# Patient Record
Sex: Male | Born: 1960 | Hispanic: No | Marital: Married | Smoking: Never smoker
Health system: Southern US, Community
[De-identification: ages and names within clinical notes are randomized; demographics above are authoritative.]

## PROBLEM LIST (undated history)

## (undated) ENCOUNTER — Emergency Department (HOSPITAL_COMMUNITY): Admission: EM | Payer: Self-pay | Source: Home / Self Care

## (undated) DIAGNOSIS — E785 Hyperlipidemia, unspecified: Secondary | ICD-10-CM

## (undated) DIAGNOSIS — M1990 Unspecified osteoarthritis, unspecified site: Secondary | ICD-10-CM

## (undated) DIAGNOSIS — I219 Acute myocardial infarction, unspecified: Secondary | ICD-10-CM

## (undated) DIAGNOSIS — I10 Essential (primary) hypertension: Secondary | ICD-10-CM

## (undated) DIAGNOSIS — G4733 Obstructive sleep apnea (adult) (pediatric): Secondary | ICD-10-CM

## (undated) DIAGNOSIS — I2510 Atherosclerotic heart disease of native coronary artery without angina pectoris: Secondary | ICD-10-CM

## (undated) DIAGNOSIS — J189 Pneumonia, unspecified organism: Secondary | ICD-10-CM

## (undated) HISTORY — PX: OTHER SURGICAL HISTORY: SHX169

## (undated) HISTORY — DX: Essential (primary) hypertension: I10

## (undated) HISTORY — DX: Atherosclerotic heart disease of native coronary artery without angina pectoris: I25.10

## (undated) HISTORY — DX: Unspecified osteoarthritis, unspecified site: M19.90

## (undated) HISTORY — DX: Hyperlipidemia, unspecified: E78.5

## (undated) HISTORY — PX: HERNIA REPAIR: SHX51

## (undated) HISTORY — DX: Obstructive sleep apnea (adult) (pediatric): G47.33

## (undated) HISTORY — PX: ARTHROSCOPIC REPAIR ACL: SUR80

---

## 2010-07-25 DIAGNOSIS — I219 Acute myocardial infarction, unspecified: Secondary | ICD-10-CM

## 2010-07-25 HISTORY — DX: Acute myocardial infarction, unspecified: I21.9

## 2010-07-25 HISTORY — PX: CORONARY STENT PLACEMENT: SHX1402

## 2010-12-20 ENCOUNTER — Inpatient Hospital Stay (HOSPITAL_COMMUNITY)
Admission: EM | Admit: 2010-12-20 | Discharge: 2010-12-23 | DRG: 247 | Disposition: A | Discharge status: Home / Self Care | Payer: 59 | Source: Ambulatory Visit | Attending: Cardiovascular Disease | Admitting: Cardiovascular Disease

## 2010-12-20 DIAGNOSIS — Z7982 Long term (current) use of aspirin: Secondary | ICD-10-CM

## 2010-12-20 DIAGNOSIS — I2510 Atherosclerotic heart disease of native coronary artery without angina pectoris: Secondary | ICD-10-CM | POA: Diagnosis present

## 2010-12-20 DIAGNOSIS — Z791 Long term (current) use of non-steroidal anti-inflammatories (NSAID): Secondary | ICD-10-CM

## 2010-12-20 DIAGNOSIS — Z79899 Other long term (current) drug therapy: Secondary | ICD-10-CM

## 2010-12-20 DIAGNOSIS — Z880 Allergy status to penicillin: Secondary | ICD-10-CM

## 2010-12-20 DIAGNOSIS — M1990 Unspecified osteoarthritis, unspecified site: Secondary | ICD-10-CM | POA: Diagnosis present

## 2010-12-20 DIAGNOSIS — E785 Hyperlipidemia, unspecified: Secondary | ICD-10-CM | POA: Diagnosis present

## 2010-12-20 DIAGNOSIS — I2582 Chronic total occlusion of coronary artery: Secondary | ICD-10-CM | POA: Diagnosis present

## 2010-12-20 DIAGNOSIS — E781 Pure hyperglyceridemia: Secondary | ICD-10-CM | POA: Diagnosis present

## 2010-12-20 DIAGNOSIS — Z7902 Long term (current) use of antithrombotics/antiplatelets: Secondary | ICD-10-CM

## 2010-12-20 DIAGNOSIS — I10 Essential (primary) hypertension: Secondary | ICD-10-CM | POA: Diagnosis present

## 2010-12-20 DIAGNOSIS — J45909 Unspecified asthma, uncomplicated: Secondary | ICD-10-CM | POA: Diagnosis present

## 2010-12-20 DIAGNOSIS — R079 Chest pain, unspecified: Secondary | ICD-10-CM

## 2010-12-20 DIAGNOSIS — I2119 ST elevation (STEMI) myocardial infarction involving other coronary artery of inferior wall: Principal | ICD-10-CM | POA: Diagnosis present

## 2010-12-20 DIAGNOSIS — F17200 Nicotine dependence, unspecified, uncomplicated: Secondary | ICD-10-CM | POA: Diagnosis present

## 2010-12-20 LAB — LIPID PANEL
Cholesterol: 203 mg/dL — ABNORMAL HIGH (ref 0–200)
Total CHOL/HDL Ratio: 8.1 RATIO
Triglycerides: 458 mg/dL — ABNORMAL HIGH (ref <150)
VLDL: UNDETERMINED mg/dL (ref 0–40)

## 2010-12-20 LAB — POCT I-STAT, CHEM 8
BUN: 17 mg/dL (ref 6–23)
Calcium, Ion: 1.16 mmol/L (ref 1.12–1.32)
Chloride: 110 mEq/L (ref 96–112)
Creatinine, Ser: 0.9 mg/dL (ref 0.4–1.5)
Glucose, Bld: 158 mg/dL — ABNORMAL HIGH (ref 70–99)
TCO2: 21 mmol/L (ref 0–100)

## 2010-12-20 LAB — COMPREHENSIVE METABOLIC PANEL
AST: 19 U/L (ref 0–37)
Albumin: 3.3 g/dL — ABNORMAL LOW (ref 3.5–5.2)
Alkaline Phosphatase: 96 U/L (ref 39–117)
BUN: 18 mg/dL (ref 6–23)
Chloride: 107 mEq/L (ref 96–112)
Creatinine, Ser: 0.87 mg/dL (ref 0.4–1.5)
GFR calc Af Amer: >60 mL/min (ref >60)
Potassium: 3.3 mEq/L — ABNORMAL LOW (ref 3.5–5.1)
Total Bilirubin: 0.2 mg/dL — ABNORMAL LOW (ref 0.3–1.2)
Total Protein: 6 g/dL (ref 6.0–8.3)

## 2010-12-20 LAB — CBC
HCT: 38.8 % — ABNORMAL LOW (ref 39.0–52.0)
MCH: 30.7 pg (ref 26.0–34.0)
MCHC: 34.5 g/dL (ref 30.0–36.0)
MCV: 89 fL (ref 78.0–100.0)
Platelets: 231 10*3/uL (ref 150–400)
RDW: 13.6 % (ref 11.5–15.5)

## 2010-12-20 LAB — CARDIAC PANEL(CRET KIN+CKTOT+MB+TROPI)
CK, MB: 2.7 ng/mL (ref 0.3–4.0)
Relative Index: 2.5 (ref 0.0–2.5)

## 2010-12-20 LAB — MRSA PCR SCREENING: MRSA by PCR: NEGATIVE

## 2010-12-21 DIAGNOSIS — I214 Non-ST elevation (NSTEMI) myocardial infarction: Secondary | ICD-10-CM

## 2010-12-21 LAB — CBC
HCT: 41.4 % (ref 39.0–52.0)
Hemoglobin: 13.2 g/dL (ref 13.0–17.0)
MCV: 91.6 fL (ref 78.0–100.0)
WBC: 10 10*3/uL (ref 4.0–10.5)

## 2010-12-21 LAB — CARDIAC PANEL(CRET KIN+CKTOT+MB+TROPI)
CK, MB: 109.9 ng/mL (ref 0.3–4.0)
CK, MB: 136.7 ng/mL (ref 0.3–4.0)
CK, MB: 80.4 ng/mL (ref 0.3–4.0)
Relative Index: 4.8 — ABNORMAL HIGH (ref 0.0–2.5)
Relative Index: 5.1 — ABNORMAL HIGH (ref 0.0–2.5)
Total CK: 2859 U/L — ABNORMAL HIGH (ref 7–232)

## 2010-12-21 LAB — BASIC METABOLIC PANEL
Calcium: 8.7 mg/dL (ref 8.4–10.5)
GFR calc Af Amer: >60 mL/min (ref >60)
GFR calc non Af Amer: >60 mL/min (ref >60)
Potassium: 4.4 mEq/L (ref 3.5–5.1)
Sodium: 139 mEq/L (ref 135–145)

## 2010-12-21 LAB — LIPID PANEL
Cholesterol: 206 mg/dL — ABNORMAL HIGH (ref 0–200)
HDL: 21 mg/dL — ABNORMAL LOW (ref >39)
LDL Cholesterol: UNDETERMINED mg/dL (ref 0–99)
Triglycerides: 557 mg/dL — ABNORMAL HIGH (ref <150)

## 2010-12-21 LAB — HEMOGLOBIN A1C
Hgb A1c MFr Bld: 5.4 % (ref <5.7)
Mean Plasma Glucose: 114 mg/dL (ref <117)
Mean Plasma Glucose: 108 mg/dL (ref <117)

## 2010-12-21 LAB — POCT ACTIVATED CLOTTING TIME: Activated Clotting Time: 334 seconds

## 2010-12-22 DIAGNOSIS — I10 Essential (primary) hypertension: Secondary | ICD-10-CM

## 2010-12-22 LAB — CBC
MCV: 92.3 fL (ref 78.0–100.0)
Platelets: 209 10*3/uL (ref 150–400)
RBC: 4.4 MIL/uL (ref 4.22–5.81)
RDW: 14.2 % (ref 11.5–15.5)
WBC: 11.9 10*3/uL — ABNORMAL HIGH (ref 4.0–10.5)

## 2010-12-22 LAB — LIPID PANEL
Cholesterol: 180 mg/dL (ref 0–200)
HDL: 24 mg/dL — ABNORMAL LOW (ref >39)
LDL Cholesterol: 78 mg/dL (ref 0–99)

## 2010-12-22 LAB — POCT ACTIVATED CLOTTING TIME: Activated Clotting Time: 364 seconds

## 2010-12-22 LAB — BASIC METABOLIC PANEL
BUN: 10 mg/dL (ref 6–23)
GFR calc Af Amer: >60 mL/min (ref >60)
GFR calc non Af Amer: >60 mL/min (ref >60)
Potassium: 4 mEq/L (ref 3.5–5.1)

## 2010-12-23 DIAGNOSIS — I2119 ST elevation (STEMI) myocardial infarction involving other coronary artery of inferior wall: Secondary | ICD-10-CM

## 2010-12-23 LAB — PROTIME-INR: Prothrombin Time: 13.5 seconds (ref 11.6–15.2)

## 2010-12-23 LAB — BASIC METABOLIC PANEL
Calcium: 9.2 mg/dL (ref 8.4–10.5)
GFR calc Af Amer: >60 mL/min (ref >60)
GFR calc non Af Amer: >60 mL/min (ref >60)
Potassium: 3.8 mEq/L (ref 3.5–5.1)
Sodium: 139 mEq/L (ref 135–145)

## 2010-12-23 LAB — CBC
HCT: 42 % (ref 39.0–52.0)
Hemoglobin: 14 g/dL (ref 13.0–17.0)
MCHC: 33.3 g/dL (ref 30.0–36.0)
MCV: 91.1 fL (ref 78.0–100.0)

## 2010-12-25 ENCOUNTER — Telehealth: Payer: Self-pay | Admitting: Physician Assistant

## 2010-12-25 NOTE — Telephone Encounter (Signed)
Pt's wife called requesting med for insomnia. I referred her to her husband's primary care MD. Pt otherwise stable, w/o cardiac complaints.

## 2011-01-05 ENCOUNTER — Encounter: Payer: Self-pay | Admitting: *Deleted

## 2011-01-06 ENCOUNTER — Encounter: Payer: Self-pay | Admitting: Physician Assistant

## 2011-01-06 ENCOUNTER — Ambulatory Visit (INDEPENDENT_AMBULATORY_CARE_PROVIDER_SITE_OTHER): Payer: 59 | Admitting: Physician Assistant

## 2011-01-06 VITALS — BP 110/80 | HR 72 | Ht 72.0 in | Wt 225.0 lb

## 2011-01-06 DIAGNOSIS — I10 Essential (primary) hypertension: Secondary | ICD-10-CM

## 2011-01-06 DIAGNOSIS — M1990 Unspecified osteoarthritis, unspecified site: Secondary | ICD-10-CM | POA: Insufficient documentation

## 2011-01-06 DIAGNOSIS — E785 Hyperlipidemia, unspecified: Secondary | ICD-10-CM | POA: Insufficient documentation

## 2011-01-06 DIAGNOSIS — I252 Old myocardial infarction: Secondary | ICD-10-CM | POA: Insufficient documentation

## 2011-01-06 DIAGNOSIS — I2510 Atherosclerotic heart disease of native coronary artery without angina pectoris: Secondary | ICD-10-CM

## 2011-01-06 NOTE — Assessment & Plan Note (Signed)
Controlled.  Continue current therapy.  

## 2011-01-06 NOTE — Progress Notes (Signed)
History of Present Illness: Primary Cardiologist: Dr. Tonny Bollman  Mario Underwood is a 50 y.o. male who was admitted 5/28-5/31 with an acute inferior STEMI.  Emergent cath demonstrated an occluded RCA which was treated with a Xience DES.  He had an 80% LAD as well.  This was treated with stage PCI with a Promus DES.  EF was 55% with inferior AK.  He returns for follow up.  Denies chest pain, dyspnea, syncope, orthopnea, PND.  No edema.  Right radial site ok.  Has lots of questions regarding activity, diet and when to return to work.  I spent several minutes today answering all of his questions.   Past Medical History  Diagnosis Date  . CAD (coronary artery disease)     A.  Inferior STEMI 12/12/10, treated with a Xience DES;  B.  Staged PCI of the LAD with a Promus DES;  C.  EF 55%, inferior AK  . Hypertension   . Hyperlipemia   . Asthma   . Osteoarthritis     Right knee TKR pending with Dr. Charlann Boxer    Current Outpatient Prescriptions  Medication Sig Dispense Refill  . aspirin 81 MG tablet Take 81 mg by mouth daily.        Marland Kitchen atorvastatin (LIPITOR) 80 MG tablet Take 80 mg by mouth daily.        . Glucosamine-Chondroitin-MSM 500-200-150 MG TABS Take by mouth.        Marland Kitchen lisinopril (PRINIVIL,ZESTRIL) 5 MG tablet Take 5 mg by mouth daily.        . metoprolol tartrate (LOPRESSOR) 25 MG tablet Take 25 mg by mouth 2 (two) times daily.        . Multiple Vitamin (DAILY MULTIVITAMIN PO) Take by mouth daily.        . nitroGLYCERIN (NITROSTAT) 0.4 MG SL tablet Place 0.4 mg under the tongue every 5 (five) minutes as needed.        . prasugrel (EFFIENT) 10 MG TABS Take by mouth daily.          Allergies: Allergies  Allergen Reactions  . Penicillins     Vital Signs: BP 110/80  Pulse 72  Ht 6' (1.829 m)  Wt 225 lb (102.059 kg)  BMI 30.52 kg/m2  PHYSICAL EXAM: Well nourished, well developed, in no acute distress HEENT: normal Neck: no JVD Cardiac:  normal S1, S2; RRR; no murmur Lungs:   clear to auscultation bilaterally, no wheezing, rhonchi or rales Abd: soft, nontender, no hepatomegaly Ext: no edema;  Right radial site without hematoma or bruit MSK:  Right knee brace noted Skin: warm and dry Neuro:  CNs 2-12 intact, no focal abnormalities noted  EKG:  NSR, HR 72, inferior Q waves with assoc TW inversions  ASSESSMENT AND PLAN:

## 2011-01-06 NOTE — Assessment & Plan Note (Signed)
Doing well.  No angina.  Continue ASA and Effient and statin.  Will start cardiac rehab soon.  Wants to go back to work.  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  Will bring him back in 4 weeks with Dr. Excell Seltzer or me to review before releasing for work.

## 2011-01-06 NOTE — Assessment & Plan Note (Signed)
Will send order to cardiac rehab in [place] so that precautions are taken for his knee (ie water, hand bike ,etc). Surgery postponed for now with recent stemi and DES.  He needs ASA and Effient for minimum of one year.

## 2011-01-06 NOTE — Assessment & Plan Note (Signed)
Check L/L in 8 weeks.

## 2011-01-06 NOTE — Patient Instructions (Signed)
Your physician recommends that you schedule a follow-up appointment in: PT NEEDS APPT THE WEEK BEFORE 01/26/11 TO SEE EITHER DR. Excell Seltzer OR SCOTT WEAVER, PA-C SAME DAY DR. Excell Seltzer IS IN THE OFFICE.  Your physician recommends that you return for lab work in: 8 WEEKS FOR FASTING LIVER/LIPID PANEL 272.4.

## 2011-01-07 ENCOUNTER — Encounter: Payer: Self-pay | Admitting: Physician Assistant

## 2011-01-14 ENCOUNTER — Ambulatory Visit: Payer: 59 | Admitting: Physician Assistant

## 2011-01-15 ENCOUNTER — Other Ambulatory Visit: Payer: Self-pay | Admitting: Cardiovascular Disease

## 2011-01-19 ENCOUNTER — Telehealth: Payer: Self-pay | Admitting: Cardiovascular Disease

## 2011-01-19 MED ORDER — ATORVASTATIN CALCIUM 80 MG PO TABS: 80.0000 mg | ORAL_TABLET | Freq: Every day | ORAL | Status: DC

## 2011-01-19 NOTE — Telephone Encounter (Signed)
Pt needs refill asap he is going on vacation and he needs lipitor 80mg  qd called into [facility] in [place] on [address].

## 2011-01-20 NOTE — H&P (Signed)
NAME:  Mario Underwood, MINER NO.:  [id number]  MEDICAL RECORD NO.:  [id number]           PATIENT TYPE:  I  LOCATION:  2807                         FACILITY:  [facility]  PHYSICIAN:  Therisa Doyne, MD    DATE OF BIRTH:  26-Dec-1960  DATE OF ADMISSION:  12/20/2010 DATE OF DISCHARGE:                             HISTORY & PHYSICAL   PRIMARY CARDIOLOGIST:  None.  CHIEF COMPLAINT:  Chest pain, the patient presents as a code STEMI.  HISTORY OF PRESENT ILLNESS:  This is a 50 year old white male with past medical history significant for hypertension who presents with 45 minutes of substernal chest pain and was found to have evidence of inferior ST-segment elevation.  The patient denies any recent angina or heart failure symptoms.  This evening he was in his usual state of health when he developed crushing substernal chest pressure that radiated to his left jaw.  EMS was called and initial EKG demonstrated inferior ST-segment elevation.  A code STEMI was called.  He received aspirin 325 mg and 3 sublingual nitroglycerin and was transferred directly to [facility] for primary PCI.  In the cath lab, he had evidence of an 100% occluded distal right coronary artery.  This was opened up with a 3 x 38 Xience Prime stent.  There was also obstructive disease in the proximal LAD.  LVEDP 27.  His left ventricular function was mildly reduced with inferior hypokinesis.  He did receive 3000 units of heparin, 16 mg of Effient was placed on Angiomax as well as Integrilin because of sluggish flow in the cath lab.  PAST MEDICAL HISTORY: 1. Hypertension. 2. Asthma.  SOCIAL HISTORY:  The patient denies any tobacco use.  She drinks a 6- pack of alcohol on the weekends.  Denies any drug use.  FAMILY HISTORY:  Notable for coronary disease in his father at a late age.  There is no history of premature coronary disease.  MEDICATIONS: 1. Bystolic 20 mg daily. 2. Proventil  ALLERGIES:   PENICILLIN.  REVIEW OF SYSTEMS:  All systems were reviewed and are negative except as mentioned above in history of present illness.  PHYSICAL EXAMINATION:  VITAL SIGNS:  Blood pressure 150/70, heart rate 90, respirations 20, oxygen saturation 100% on room air. GENERAL:  In moderate distress, diaphoretic. HEENT:  Oropharynx pink and moist without any lesions.  Pupils equal, round, and reactive to light and accommodation. NECK:  Supple.  No jugular venous distention.  No lymphadenopathy, no bruits. CARDIOVASCULAR:  Regular rate and rhythm with no murmurs, rubs, or gallops. CHEST:  Clear to auscultation bilaterally. ABDOMEN:  Positive bowel sounds, nontender, and nondistended. EXTREMITIES:  No clubbing, cyanosis, or edema.  Dorsalis pedis pulse 2+ bilaterally. SKIN:  No rashes. NEUROLOGIC:  No focal deficits. PSYCH:  Normal affect, appropriately anxious. PERIPHERAL PULSES:  2+ femoral pulses without bruits, 2+ radial pulses bilaterally with an intact palmar arch on the right.  LABORATORY DATA:  Hemoglobin 13.3, potassium 3.3, and creatinine is 0.9.  CATHETERIZATION LABORATORY DATA:  Please see dictated cath report. Briefly 100% distal right coronary arteries with 3 x 38 Xience Prime stent.  There was obstructive disease in proximal LAD.  LVEDP 27. Mildly reduced left ventricular function with inferior hypokinesis while the patient is tentatively planned to undergo knee surgery in July with discussion with him about the extent of his coronary disease and he will be best managed with drug-eluting stents and he was agreeable to this approach and understood that this would delay his knee surgeries.  ASSESSMENT AND PLAN:  This is a 50 year old white male with a past medical history significant for hypertension who presents with inferior stenting status post drug-eluting stent placement to the distal right coronary artery. 1. We will admit the patient to [facility] to the CCU  under     Dr. Colvin Caroli care. 2. Inferior ST-elevation myocardial infarction.  He has been     successfully revascularized with a Xience Prime stent.  Because of     his TIMI-2 flow at the end of the case, we will continue on     Integrilin for 18 hours and after the procedure we will continue     him on aspirin 325 mg daily and Effient 10 mg daily.  We will start     him on beta-blocker, ACE inhibitor, and statin therapy.  He will     need to have his left anterior descending artery disease     revascularized with a staged procedure.  We will check a fasting     profile with hemoglobin A1c and cycle cardiac enzymes. 3. Hypertension.  We will monitor the patient's blood pressure on the     above medications. 4. Fluids, electrolytes, nutrition.  Normal saline at 75 mL per hour.     We will replete his potassium as he does have evidence of     hypokalemia.  Regular diet. 5. Deep venous thrombosis prophylaxis.  Subcutaneous heparin.     Therisa Doyne, MD     SJT/MEDQ  D:  12/20/2010  T:  12/20/2010  Job:  [id number]  Electronically Signed by Aldona Bar MD on 01/20/2011 10:10:11 PM

## 2011-02-01 ENCOUNTER — Encounter: Payer: Self-pay | Admitting: Physician Assistant

## 2011-02-01 ENCOUNTER — Ambulatory Visit (INDEPENDENT_AMBULATORY_CARE_PROVIDER_SITE_OTHER): Payer: 59 | Admitting: Physician Assistant

## 2011-02-01 ENCOUNTER — Other Ambulatory Visit: Payer: Self-pay | Admitting: *Deleted

## 2011-02-01 VITALS — BP 92/60 | HR 60 | Resp 16 | Ht 71.0 in | Wt 220.0 lb

## 2011-02-01 DIAGNOSIS — I2510 Atherosclerotic heart disease of native coronary artery without angina pectoris: Secondary | ICD-10-CM

## 2011-02-01 MED ORDER — PRASUGREL HCL 10 MG PO TABS: 10.0000 mg | ORAL_TABLET | Freq: Every day | ORAL | Status: DC

## 2011-02-01 NOTE — Telephone Encounter (Signed)
rx sent in for effient with 11 refills. Danielle Rankin

## 2011-02-01 NOTE — Progress Notes (Signed)
History of Present Illness: Primary Cardiologist: Dr. Tonny Bollman  Mario Underwood is a 50 y.o. male who was admitted 5/28-5/31 with an acute inferior STEMI.  Emergent cath demonstrated an occluded RCA which was treated with a Xience DES.  He had an 80% LAD as well.  This was treated with staged PCI with a Promus DES.  EF was 55% with inferior AK.  I saw him a month ago in follow up.  He is brought back today to get released to go back to work.  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  Has been doing good with cardiac rehab.  No complaints today.  No chest pain, dyspnea, fatigue, syncope or near syncope.  Has lost 5 pounds.  Past Medical History  Diagnosis Date  . CAD (coronary artery disease)     A.  Inferior STEMI 12/12/10, treated with a Xience DES;  B.  Staged PCI of the LAD with a Promus DES;  C.  EF 55%, inferior AK  . Hypertension   . Hyperlipemia   . Asthma   . Osteoarthritis     Right knee TKR pending with Dr. Charlann Boxer    Current Outpatient Prescriptions  Medication Sig Dispense Refill  . aspirin 81 MG tablet Take 81 mg by mouth daily.        Marland Kitchen atorvastatin (LIPITOR) 80 MG tablet Take 1 tablet (80 mg total) by mouth daily.  30 tablet  11  . EFFIENT 10 MG TABS TAKE 1 TABLET DAILY.  30 tablet  0  . Glucosamine-Chondroitin-MSM 500-200-150 MG TABS Take by mouth.        Marland Kitchen lisinopril (PRINIVIL,ZESTRIL) 5 MG tablet Take 5 mg by mouth daily.        . metoprolol tartrate (LOPRESSOR) 25 MG tablet Take 25 mg by mouth 2 (two) times daily.        . Multiple Vitamin (DAILY MULTIVITAMIN PO) Take by mouth daily.        . nitroGLYCERIN (NITROSTAT) 0.4 MG SL tablet Place 0.4 mg under the tongue every 5 (five) minutes as needed.          Allergies: Allergies  Allergen Reactions  . Penicillins     Vital Signs: BP 92/60  Pulse 60  Resp 16  Ht 5\' 11"  (1.803 m)  Wt 220 lb (99.791 kg)  BMI 30.68 kg/m2  PHYSICAL EXAM: Well nourished, well  developed, in no acute distress HEENT: normal Neck: no JVD Cardiac:  normal S1, S2; RRR; no murmur Lungs:  clear to auscultation bilaterally, no wheezing, rhonchi or rales Abd: soft, nontender, no hepatomegaly Ext: no edema MSK:  Right knee brace noted Skin: warm and dry Neuro:  CNs 2-12 intact, no focal abnormalities noted   ASSESSMENT AND PLAN:

## 2011-02-01 NOTE — Assessment & Plan Note (Signed)
Doing well, now 6 weeks post MI.  Patient spoke to Dr. Excell Seltzer.  I spoke with Dr. Excell Seltzer as well.  We plan to release him to go back to work 1/2 days starting next week for the next 1-2 months.  Letter will be very specific to allow frequent breaks and to not work if outside [employer] exceed 95 degrees.  Patient has follow up with Dr. Excell Seltzer in a few weeks.  Will keep that so he can update [place] on how he is doing back at work.

## 2011-02-10 NOTE — Cardiovascular Report (Signed)
NAME:  Mario Underwood, Mario Underwood NO.:  [id number]  MEDICAL RECORD NO.:  [id number]           PATIENT TYPE:  I  LOCATION:  2908                         FACILITY:  [facility]  PHYSICIAN:  Veverly Fells. Excell Seltzer, MD  DATE OF BIRTH:  11-20-1960  DATE OF PROCEDURE:  12/20/2010 DATE OF DISCHARGE:                           CARDIAC CATHETERIZATION   PROCEDURE: 1. Left heart catheterization. 2. Selective coronary angiography. 3. Left ventricular angiography. 4. PTCA and stenting of the right coronary artery.  PROCEDURAL INDICATIONS:  Mario Underwood is a 50 year old gentleman presenting with an acute inferior wall myocardial infarction.  He was brought directly from [facility] to the cardiac cath lab after a code STEMI was called.  The patient has no past cardiac history.  He is treated for hypertension and asthma.  Risks and indications of the procedure were reviewed with the patient, emergency consent was obtained.  The right wrist was prepped, draped and anesthetized with 1% lidocaine.  Using modified Seldinger technique, a 6- French sheath was placed in the right radial artery via a front wall puncture.  There was no difficulty with access.  The patient HAD been given 3000 units of unfractionated heparin and a bivalirudin infusion was started.  A JL-3.5 cm guide catheter was used to image the left coronary artery.  A JR-4 guide catheter was used to image the right coronary artery.  Left coronary artery images demonstrated severe LAD stenosis, but this did not appear to be his culprit vessel.  The right coronary artery was totally occluded in the midportion and this was clearly an acute occlusion with the coronary distribution fitting the electrocardiographic changes.  The patient was given 60 mg of Effient. A Cougar guidewire was used to cross the lesion.  The vessel was dilated with a 2.5 x 15-mm balloon, which was taken to 6 atmospheres for a total of 2 inflations.  There was  a long segment of severe segmental disease in the mid and distal vessel.  The patient also had severe LAD stenosis that would require intervention.  I debated about whether to treat the patient with a drug-eluting or bare-metal stent platform because he needs to have total knee replacement.  I felt the best treatment from a cardiac perspective would be to use a drug-eluting stent platform considering the diffuse nature of his coronary disease and the need for multivessel intervention.  I discussed this with the patient and elected to treat him that way.  A 3.0 x 38-mm Xience drug-eluting stent was carefully positioned and deployed at 10 atmospheres.  The stent was postdilated with a 3.25 x 20-mm Phillips Quantum apex balloon, which was taken to 16 and 14 atmospheres.  There was a good angiographic result at the lesion site, however, there was TIMI II flow in all the distal branch vessels.  The patient was given repeated doses of intracoronary nitroglycerin.  Integrilin had been started via a double bolus and drip protocol because of sluggish flow in the distal vasculature.  The patient's chest pain had diminished greatly and I felt he had received a maximum benefit from his PCI procedure.  A  pigtail catheter was used to perform left ventriculography into a pullback across the aortic valve. He was hemodynamically stable throughout the entire procedure.  TR band was used for radial hemostasis.  PROCEDURAL FINDINGS:  Aortic pressure 137/82 with a mean of 109, left ventricular pressure 135/25.  Left ventriculography shows basal inferior akinesis.  The other LV segments are hyperdynamic.  The left ventricular ejection fraction is 55%.  CORONARY ANGIOGRAPHY:  The right coronary has segmental stenosis.  The vessel has a 50% proximal lesion.  There is diffuse plaquing in the midportion.  The distal RCA just beyond the RV marginal branch has a total occlusion after reopening the vessel.  The vessel  is seemed to give off PDA branch and at least 4 posterolateral branches.  The left main is patent.  It divides into the LAD and left circumflex.  LAD:  The LAD is a large-caliber vessel.  There is a high diagonal/ramus intermedius branch that has a 50-60% proximal stenosis.  The second diagonal branch has nonobstructive stenosis in the range of 40%.  The LAD just beyond the second diagonal branch has an 80% eccentric long segment stenosis and the vessel courses down and wraps around the left ventricular apex.  Left circumflex.  The left circumflex involves a fairly small distribution.  There are 2 OM branches both with minor diffuse plaque.  FINAL ASSESSMENT: 1. Inferior wall myocardial infarction secondary to total occlusion of     the right coronary artery, with successful primary percutaneous     coronary intervention using a long drug-eluting stent platform.  Of     note, there was TIMI II flow into the distal branch vessels of the     right coronary artery at the completion of the procedure. 2. Severe mid left anterior descending stenosis. 3. Moderate ramus intermedius/diagonal stenosis. 4. Mild left ventricular segmental dysfunction.  PLAN:  The patient should receive aspirin and Effient for a minimum of 12 months.  He will likely need staged PCI of the LAD.  We will discuss this with him further.  We need to their review his revascularization options in light of the need for knee replacement.     Veverly Fells. Excell Seltzer, MD     MDC/MEDQ  D:  12/20/2010  T:  12/21/2010  Job:  [id number]  cc:   Verl Dicker  Electronically Signed by Tonny Bollman MD on 02/10/2011 12:07:19 AM

## 2011-02-10 NOTE — Cardiovascular Report (Signed)
  NAME:  Mario Underwood, Mario Underwood NO.:  [id number]  MEDICAL RECORD NO.:  [id number]          PATIENT TYPE:  LOCATION:                                 FACILITY:  PHYSICIAN:  Veverly Fells. Excell Seltzer, MD  DATE OF BIRTH:  [birth year]  DATE OF PROCEDURE:  12/22/2010 DATE OF DISCHARGE:                           CARDIAC CATHETERIZATION   PROCEDURE: 1. Selective coronary angiography of the right coronary artery. 2. Drug-eluting stent implantation in the LAD.  PROCEDURAL INDICATIONS:  Mario Underwood is a 50 year old gentleman who presented with an acute inferior wall ST-elevation MI on Dec 20, 2010. He was treated with primary PCI using a long drug-eluting stent.  He returns today for staged PCI of the LAD.  He has a long segment 80% proximal to mid LAD stenosis.  Risks and indications of the procedure were reviewed with the patient. Informed consent was obtained.  The right wrist was prepped, draped, and anesthetized with 1% lidocaine.  Using the modified Seldinger technique, a 6-French sheath was placed in the right radial artery.  Verapamil 3 mg was administered through the sheath.  Bivalirudin was started for anticoagulation.  The patient has been adequately preloaded with aspirin and Effient.  Once a therapeutic ACT was achieved, a BMW guidewire was advanced into the LAD.  I had previously placed a Cougar guidewire into an intermediate branch as I was unable to engage the LAD without wire. The BMW was also difficult to enter into the LAD because of acute angulation, but with the assistance of a balloon, I was able to direct the wire beyond the area of stenosis into the distal LAD.  The vessel was then predilated with a 2.5- x 15-mm apex balloon which was dilated to 8 atmospheres.  The balloon was left in place to assess lesion length.  The wire was also pulled back to use a radiopaque portion for further assessment of lesion length.  I ultimately decided to treat the lesion with a  3.0- x 28-mm Promus drug-eluting stent.  The stent was carefully positioned and deployed at 12 atmospheres.  It appeared under- expanded, and the proximal portion of the stent was therefore postdilated with a 3.25- x 20-mm Chauncey Quantum apex balloon which was taken to 16 atmospheres for a total of two inflations which left 0% residual stenosis.  There was TIMI 3 flow in the vessel.  The patient tolerated the entire procedure well.  There were no immediate complications.  A TR band was used for radial hemostasis.  FINAL ASSESSMENT:  Successful percutaneous coronary intervention of severe proximal left anterior descending stenosis using a drug-eluting stent platform.  RECOMMENDATIONS:  The patient should be continued on dual antiplatelet therapy with aspirin and Effient for a minimum of 12 months without interruption.     Veverly Fells. Excell Seltzer, MD     MDC/MEDQ  D:  12/22/2010  T:  12/23/2010  Job:  [id number]  cc:   Levonne Lapping  Electronically Signed by Tonny Bollman MD on 02/10/2011 12:07:25 AM

## 2011-02-10 NOTE — Discharge Summary (Signed)
NAME:  Mario Underwood, KNOBLOCK NO.:  [id number]  MEDICAL RECORD NO.:  [id number]           PATIENT TYPE:  I  LOCATION:  6533                         FACILITY:  [facility]  PHYSICIAN:  Veverly Fells. Excell Seltzer, MD  DATE OF BIRTH:  12/29/1960  DATE OF ADMISSION:  12/20/2010 DATE OF DISCHARGE:  12/23/2010                              DISCHARGE SUMMARY   PRIMARY CARDIOLOGIST:  Veverly Fells. Excell Seltzer, MD  PRIMARY CARE PROVIDER:  Verdia Kuba, MD in [place].  DISCHARGE DIAGNOSIS:  Acute inferior ST-segment elevation myocardial infarction.  SECONDARY DIAGNOSES: 1. Coronary artery disease, status post drug-eluting stent placement     of the right coronary artery and proximal left anterior descending     artery this admission. 2. Hypertension. 3. Hyperlipidemia. 4. Hypertriglyceridemia. 5. Asthma. 6. Osteoarthritis with heavy usage of nonsteroidal anti-inflammatory     drugs in the past. 7. Tobacco abuse (dip).  ALLERGIES:  PENICILLIN.  PROCEDURES: 1. Emergent left heart cardiac catheterization performed on Dec 20, 2010, revealing a total occlusion of the mid-right coronary artery     as well as an 80% stenosis in the LAD and nonobstructive disease in     the ramus intermedius and diagonal.  The RCA was successfully     stented with 3.0 x 38-mm Xience Prime LL drug-eluting stent. 2. Staged PCI and stenting of the proximal LAD on Dec 22, 2010, and     placement of a 3.0 x 28-mm Promus Element Plus drug-eluting stent.  HISTORY OF PRESENT ILLNESS:  A 50 year old male without prior history of coronary artery disease who was in his usual state of health until the early morning hours on Dec 20, 2010, when the patient noted substernal chest discomfort with radiation to his jaw.  EMS was activated and he was noted to have inferior ST-segment elevation on ECG.  He was treated with aspirin, morphine, nitroglycerin, and heparin and taken emergently to the [facility]  Lab.  HOSPITAL COURSE:  The patient underwent emergent diagnostic catheterization revealing a total occlusion of the mid-right coronary artery as well as an 80% stenosis in the LAD.  The right coronary artery was felt to be the infarct vessel and this was successfully treated at 3.0 x 38-mm Xience Prime drug-eluting stent.  It was felt that the LAD would benefit from intervention in a staged approach.  The patient was treated with aspirin and Effient and monitored in the coronary intensive care unit where high-dose statin, beta-blocker and ACE inhibitor were started.  The patient eventually peaked his CK at 2859, MB at 136.7, and troponin-I greater than 25.  He had no recurrent chest pain and was taken back to Cath Lab on Dec 22, 2010, where he underwent PCI and stenting of the proximal LAD with placement of 3.0 x 28-mm Promus Element Plus drug-eluting stent.  The patient tolerated this procedure well and postprocedure, has been ambulating without recurrent symptoms or limitations.  He has been seen by Cardiac Rehab with outpatient referral made.  He has also been counseled on the importance of cessation of oral tobacco, which he now says  he quit.  Finally, he has been counseled the importance of avoidance of nonsteroidals in the setting of aspirin and Effient as well as recent MI.  The patient had previously been scheduled for total knee arthroplasty in July, which at this point we advised again that he will require aspirin and Effient therapy without cessation for the next 12 months.  He will follow up with Dr. Charlann Boxer for additional pain management of that knee pain.  The patient will be discharged home today in good condition.  DISCHARGE LABS:  Hemoglobin 14.0, hematocrit 42.2 WBC 8.8, platelets 206.  INR 1.01.  Sodium 139, potassium 3.8, chloride 103, CO2 28, BUN 10, creatinine 0.78, glucose 87.  Total bilirubin 0.2, alkaline phosphatase 96, AST 19, ALT 19, total protein 6.0, albumin  3.2, calcium 9.2.  Hemoglobin A1c 5.4.  CK 1571, MB 80.4, troponin-I greater than 25. Total cholesterol 180, triglycerides 389, HDL 24, LDL 78.  MRSA screen was negative.  DISPOSITION:  The patient will be discharged home today in good condition.  FOLLOWUP PLANS AND APPOINTMENTS:  The patient will follow up with Tereso Newcomer, PA on January 06, 2011, at 10:30 a.m.  He was asked to follow up with Dr. Charlann Boxer and Dr. Marina Goodell as previously scheduled.  DISCHARGE MEDICATIONS: 1. Acetaminophen 5 mg two tablets q.6 h. p.r.n. arthritic pain. 2. Aspirin 81 mg daily. 3. Lipitor 80 mg nightly. 4. Lisinopril 5 mg daily. 5. Metoprolol 25 mg b.i.d. 6. Nitroglycerin 0.4 mg sublingual p.r.n. chest pain. 7. Prasugrel 10 mg daily. 8. Glucosamine and chondroitin one tablet daily. 9. Multivitamin one tablet daily.  OUTSTANDING LABORATORY STUDIES:  Follow up lipids and LFTs in 6-8 weeks given new statin therapy.  DURATION OF DISCHARGE ENCOUNTER:  Sixty minutes including physician time.     [name], ANP   ______________________________ Veverly Fells. Excell Seltzer, MD   CB/MEDQ  D:  12/23/2010  T:  12/24/2010  Job:  [id number]  cc:   Verdia Kuba, MD Madlyn Frankel. Charlann Boxer, M.D.  Electronically Signed by [name] ANP on 12/28/2010 08:07:52 PM Electronically Signed by Tonny Bollman MD on 02/10/2011 12:07:30 AM

## 2011-02-18 ENCOUNTER — Encounter: Payer: Self-pay | Admitting: Cardiovascular Disease

## 2011-02-18 ENCOUNTER — Ambulatory Visit (INDEPENDENT_AMBULATORY_CARE_PROVIDER_SITE_OTHER): Payer: 59 | Admitting: Cardiovascular Disease

## 2011-02-18 DIAGNOSIS — E785 Hyperlipidemia, unspecified: Secondary | ICD-10-CM

## 2011-02-18 DIAGNOSIS — I10 Essential (primary) hypertension: Secondary | ICD-10-CM

## 2011-02-18 DIAGNOSIS — I2510 Atherosclerotic heart disease of native coronary artery without angina pectoris: Secondary | ICD-10-CM

## 2011-02-18 NOTE — Progress Notes (Signed)
HPI:  This is a 50 year old gentleman presenting for followup evaluation.  The patient presented May 28 with an acute inferior wall MI. He was treated with primary PCI is in a long drug eluding stent in the right coronary artery. He underwent staged intervention of the LAD a few days later. He presents today for follow up evaluation. The patient is doing very well from a cardiovascular standpoint. He has lost over 30 pounds. He denies chest pain or dyspnea. He is back to work part-time and tolerating this well. He continues to be limited by right knee pain and is awaiting clearance to undergo right knee replacement. He is going to have to wait until next year because of his MI and treatment with drug-eluting stents.  Outpatient Encounter Prescriptions as of 02/18/2011  Medication Sig Dispense Refill  . aspirin 81 MG tablet Take 81 mg by mouth daily.        Marland Kitchen atorvastatin (LIPITOR) 80 MG tablet Take 1 tablet (80 mg total) by mouth daily.  30 tablet  11  . Glucosamine-Chondroitin-MSM 500-200-150 MG TABS Take by mouth.        Marland Kitchen lisinopril (PRINIVIL,ZESTRIL) 5 MG tablet Take 5 mg by mouth daily.        . metoprolol tartrate (LOPRESSOR) 25 MG tablet Take 25 mg by mouth 2 (two) times daily.        . Multiple Vitamin (DAILY MULTIVITAMIN PO) Take by mouth daily.        . nitroGLYCERIN (NITROSTAT) 0.4 MG SL tablet Place 0.4 mg under the tongue every 5 (five) minutes as needed.        . prasugrel (EFFIENT) 10 MG TABS Take 1 tablet (10 mg total) by mouth daily.  30 tablet  11    Allergies  Allergen Reactions  . Penicillins     Past Medical History  Diagnosis Date  . CAD (coronary artery disease)     A.  Inferior STEMI 12/12/10, treated with a Xience DES;  B.  Staged PCI of the LAD with a Promus DES;  C.  EF 55%, inferior AK  . Hypertension   . Hyperlipemia   . Asthma   . Osteoarthritis     Right knee TKR pending with Dr. Charlann Boxer    ROS: Negative except as per HPI  BP 109/73  Pulse 82  Resp 16  Ht  5\' 11"  (1.803 m)  Wt 216 lb (97.977 kg)  BMI 30.13 kg/m2  PHYSICAL EXAM: Pt is alert and oriented, NAD HEENT: normal Neck: JVP - normal, carotids 2+= without bruits Lungs: CTA bilaterally CV: RRR without murmur or gallop Abd: soft, NT, Positive BS, no hepatomegaly Ext: no C/C/E, distal pulses intact and equal Skin: warm/dry no rash  ASSESSMENT AND PLAN:

## 2011-02-18 NOTE — Assessment & Plan Note (Signed)
Blood pressure very well controlled on current medical regimen.

## 2011-02-18 NOTE — Patient Instructions (Signed)
Your physician recommends that you schedule a follow-up appointment in: 3 months with Dr. Cooper.  

## 2011-02-18 NOTE — Assessment & Plan Note (Signed)
The patient is stable without angina. He is on a good medical program with aspirin, effient, atorvastatin, metoprolol, and lisinopril. I like to see him back in 3 months for followup evaluation. He is going to move from part-time to full-time work in September. I think he will probably tolerate this well. Note the patient has preserved LV function. When he gets closer to his knee replacement surgery next year we'll plan on a Myoview stress scan to rule out ischemia before surgery.

## 2011-02-18 NOTE — Assessment & Plan Note (Signed)
Patient is scheduled for followup labs in August to assess his lipids and LFTs.

## 2011-03-03 ENCOUNTER — Other Ambulatory Visit (INDEPENDENT_AMBULATORY_CARE_PROVIDER_SITE_OTHER): Payer: 59 | Admitting: *Deleted

## 2011-03-03 DIAGNOSIS — E785 Hyperlipidemia, unspecified: Secondary | ICD-10-CM

## 2011-03-03 LAB — HEPATIC FUNCTION PANEL
ALT: 30 U/L (ref 0–53)
AST: 23 U/L (ref 0–37)
Albumin: 4.3 g/dL (ref 3.5–5.2)
Alkaline Phosphatase: 94 U/L (ref 39–117)
Total Bilirubin: 1.1 mg/dL (ref 0.3–1.2)

## 2011-03-03 LAB — LIPID PANEL
Cholesterol: 92 mg/dL (ref 0–200)
LDL Cholesterol: 45 mg/dL (ref 0–99)
VLDL: 11.6 mg/dL (ref 0.0–40.0)

## 2011-03-04 ENCOUNTER — Telehealth: Payer: Self-pay | Admitting: *Deleted

## 2011-03-04 NOTE — Telephone Encounter (Signed)
pt aware of lab results and to start 1000 mg of fish oil. Mario Underwood

## 2011-03-07 ENCOUNTER — Ambulatory Visit: Payer: 59 | Admitting: Cardiovascular Disease

## 2011-03-22 ENCOUNTER — Telehealth: Payer: Self-pay | Admitting: Cardiovascular Disease

## 2011-03-22 NOTE — Telephone Encounter (Signed)
Pt calling to see if it was ok for him to get a cortisone shot in pt knee. Pt is going in for appt tomorrow to get fluid pulled off his knee and wanted to check if he could get a cortisone shot just in case his DR offered it to him. Please return pt call to advise/discuss.

## 2011-03-22 NOTE — Telephone Encounter (Signed)
Spoke with pt and gave him information from Dr. Cooper 

## 2011-03-22 NOTE — Telephone Encounter (Signed)
Fine to proceed, but he cannot interrupt plavix.

## 2011-05-19 ENCOUNTER — Encounter: Payer: Self-pay | Admitting: Cardiovascular Disease

## 2011-05-19 ENCOUNTER — Ambulatory Visit (INDEPENDENT_AMBULATORY_CARE_PROVIDER_SITE_OTHER): Payer: BC Managed Care – PPO | Admitting: Cardiovascular Disease

## 2011-05-19 VITALS — BP 112/80 | HR 72 | Resp 18 | Ht 71.0 in | Wt 196.8 lb

## 2011-05-19 DIAGNOSIS — E785 Hyperlipidemia, unspecified: Secondary | ICD-10-CM

## 2011-05-19 DIAGNOSIS — I2510 Atherosclerotic heart disease of native coronary artery without angina pectoris: Secondary | ICD-10-CM

## 2011-05-19 DIAGNOSIS — I252 Old myocardial infarction: Secondary | ICD-10-CM

## 2011-05-19 DIAGNOSIS — I10 Essential (primary) hypertension: Secondary | ICD-10-CM

## 2011-05-19 NOTE — Patient Instructions (Signed)
Your physician has requested that you have a lexiscan myoview in [facility] 2013. For further information please visit [email or web address] Please follow instruction sheet, as given.  Your physician wants you to follow-up in: May 2013. You will receive a reminder letter in the mail two months in advance. If you don't receive a letter, please call our office to schedule the follow-up appointment.  Your physician recommends that you continue on your current medications as directed. Please refer to the Current Medication list given to you today.

## 2011-05-19 NOTE — Progress Notes (Signed)
HPI:  This is a 50 year old gentleman presenting for followup evaluation. 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  The patient underwent stenting of a severe stenosis in the right coronary artery and was treated with a long drug-eluting stent platform. He was again treated with a drug-eluting stent in the LAD during his initial hospitalization. He has had no recurrent heart problems since that time. He denies exertional chest pain or dyspnea. He is limited by bilateral knee pain. He was scheduled for right total knee replacement but this had to be delayed because of his myocardial infarction. In the interim the patient has injured his left knee and he is having worse pain in the left knee.  Outpatient Encounter Prescriptions as of 05/19/2011  Medication Sig Dispense Refill  . aspirin 81 MG tablet Take 81 mg by mouth daily.        Marland Kitchen atorvastatin (LIPITOR) 80 MG tablet Take 1 tablet (80 mg total) by mouth daily.  30 tablet  11  . Glucosamine-Chondroitin-MSM 500-200-150 MG TABS Take by mouth.        Marland Kitchen lisinopril (PRINIVIL,ZESTRIL) 5 MG tablet Take 5 mg by mouth daily.        . metoprolol tartrate (LOPRESSOR) 25 MG tablet Take 25 mg by mouth 2 (two) times daily.        . Multiple Vitamin (DAILY MULTIVITAMIN PO) Take by mouth daily.        . nitroGLYCERIN (NITROSTAT) 0.4 MG SL tablet Place 0.4 mg under the tongue every 5 (five) minutes as needed.        . prasugrel (EFFIENT) 10 MG TABS Take 1 tablet (10 mg total) by mouth daily.  30 tablet  11    Allergies  Allergen Reactions  . Penicillins     Past Medical History  Diagnosis Date  . CAD (coronary artery disease)     A.  Inferior STEMI 12/12/10, treated with a Xience DES;  B.  Staged PCI of the LAD with a Promus DES;  C.  EF 55%, inferior AK  . Hypertension   . Hyperlipemia   . Asthma   . Osteoarthritis     Right knee TKR pending with Dr. Charlann Boxer    ROS: Negative except as per HPI  BP 112/80  Pulse 72  Resp 18  Ht 5\' 11"  (1.803 m)   Wt 196 lb 12.8 oz (89.268 kg)  BMI 27.45 kg/m2  PHYSICAL EXAM: Pt is alert and oriented, NAD HEENT: normal Neck: JVP - normal, carotids 2+= without bruits Lungs: CTA bilaterally CV: RRR without murmur or gallop Abd: soft, NT, Positive BS, no hepatomegaly Ext: no C/C/E, distal pulses intact and equal Skin: warm/dry no rash  EKG:  Normal sinus rhythm 73 beats per minute, inferior infarct age undetermined, voltage criteria for LVH maybe normal variant.  ASSESSMENT AND PLAN:

## 2011-05-20 NOTE — Assessment & Plan Note (Signed)
Well-controlled on current medical program. The patient continues to work hard on diet and exercise.

## 2011-05-20 NOTE — Assessment & Plan Note (Signed)
Lipids have been at goal. His total cholesterol is less than 100 mg per deciliter

## 2011-05-20 NOTE — Assessment & Plan Note (Signed)
The patient is stable without angina. He will continue his current medical program which includes dual antiplatelet therapy with aspirin and effient.  He asked me whether he could have an MRI of his left knee considering his coronary stents and there is no contraindication to this. We will plan on a nuclear stress test in the spring so that he can have total knee replacement nearly end of May after he has completed 12 months of antiplatelet therapy following his MI.

## 2011-06-24 ENCOUNTER — Telehealth: Payer: Self-pay | Admitting: Cardiovascular Disease

## 2011-06-24 NOTE — Telephone Encounter (Signed)
I spoke with the pt's wife and made her aware that these medications were okay to take.

## 2011-06-24 NOTE — Telephone Encounter (Signed)
New problem Pt's wife said he has the flu. He has been prescribed tamiflu and tylenol with codeine She wants to know if these are ok to take

## 2011-07-03 ENCOUNTER — Other Ambulatory Visit: Payer: Self-pay | Admitting: Cardiovascular Disease

## 2011-07-10 ENCOUNTER — Other Ambulatory Visit: Payer: Self-pay | Admitting: Cardiovascular Disease

## 2011-11-04 ENCOUNTER — Other Ambulatory Visit: Payer: BC Managed Care – PPO

## 2011-11-04 ENCOUNTER — Ambulatory Visit (INDEPENDENT_AMBULATORY_CARE_PROVIDER_SITE_OTHER): Payer: BC Managed Care – PPO | Admitting: Cardiovascular Disease

## 2011-11-04 ENCOUNTER — Encounter: Payer: Self-pay | Admitting: Cardiovascular Disease

## 2011-11-04 VITALS — BP 120/84 | HR 60 | Ht 72.0 in | Wt 188.0 lb

## 2011-11-04 DIAGNOSIS — E785 Hyperlipidemia, unspecified: Secondary | ICD-10-CM

## 2011-11-04 DIAGNOSIS — I10 Essential (primary) hypertension: Secondary | ICD-10-CM

## 2011-11-04 DIAGNOSIS — I252 Old myocardial infarction: Secondary | ICD-10-CM

## 2011-11-04 DIAGNOSIS — I2510 Atherosclerotic heart disease of native coronary artery without angina pectoris: Secondary | ICD-10-CM

## 2011-11-04 NOTE — Patient Instructions (Signed)
Your physician has requested that you have an exercise stress myoview. For further information please visit [email or web address] Please follow instruction sheet, as given.  Your physician wants you to follow-up in: 6 MONTHS.  You will receive a reminder letter in the mail two months in advance. If you don't receive a letter, please call our office to schedule the follow-up appointment.  Your physician has recommended you make the following change in your medication: DECREASE Lipitor to 40mg  daily  Your physician recommends that you return for a FASTING LIPID, LIVER and BMP in 1 MONTH--nothing to eat or drink after midnight, lab opens at 8:30

## 2011-11-09 ENCOUNTER — Encounter: Payer: Self-pay | Admitting: Cardiovascular Disease

## 2011-11-09 NOTE — Assessment & Plan Note (Signed)
The patient is on high-dose Lipitor. Last lipids demonstrated an LDL of less than 70.

## 2011-11-09 NOTE — Progress Notes (Signed)
   HPI: This is a 51 year old gentleman presenting for followup evaluation. 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 The patient underwent stenting of a severe stenosis in the right coronary artery and was treated with a long drug-eluting stent platform. He was again treated with a drug-eluting stent in the LAD during his initial hospitalization. He has had no recurrent heart problems since that time.  He has been able to walk regularly for exercise. He denies any cardiac symptoms with exertion. He specifically denies chest pain, chest pressure, dyspnea, edema, or palpitations. He is moderately limited by knee pain and has been planning on undergoing knee replacement when he is out to 12 months from his PCI procedure. However, his knee pain has stabilized with the use of a knee brace and he will has changed his mind about surgery for the present time.  Outpatient Encounter Prescriptions as of 11/04/2011  Medication Sig Dispense Refill  . aspirin 81 MG tablet Take 81 mg by mouth daily.        Marland Kitchen atorvastatin (LIPITOR) 40 MG tablet Take 1 tablet (40 mg total) by mouth daily.  1 tablet  0  . Glucosamine-Chondroitin-MSM 500-200-150 MG TABS Take by mouth.        Marland Kitchen lisinopril (PRINIVIL,ZESTRIL) 5 MG tablet TAKE 1 TABLET DAILY.  30 tablet  6  . metoprolol tartrate (LOPRESSOR) 25 MG tablet TAKE 1 TABLET 2 TIMES DAILY.  60 tablet  6  . Multiple Vitamin (DAILY MULTIVITAMIN PO) Take by mouth daily.        . nitroGLYCERIN (NITROSTAT) 0.4 MG SL tablet Place 0.4 mg under the tongue every 5 (five) minutes as needed.        . Omega-3 Fatty Acids (FISH OIL) 1000 MG CAPS Take by mouth daily.      . prasugrel (EFFIENT) 10 MG TABS Take 1 tablet (10 mg total) by mouth daily.  30 tablet  11  . DISCONTD: atorvastatin (LIPITOR) 80 MG tablet TAKE 1 TABLET EVERY EVENING AT BEDTIME.  30 tablet  6  . DISCONTD: atorvastatin (LIPITOR) 80 MG tablet Take 0.5 tablets (40 mg total) by mouth daily.  1 tablet  0    Allergies    Allergen Reactions  . Penicillins     Past Medical History  Diagnosis Date  . CAD (coronary artery disease)     A.  Inferior STEMI 12/12/10, treated with a Xience DES;  B.  Staged PCI of the LAD with a Promus DES;  C.  EF 55%, inferior AK  . Hypertension   . Hyperlipemia   . Asthma   . Osteoarthritis     Right knee TKR pending with Dr. Charlann Boxer    ROS: Negative except as per HPI  BP 120/84  Pulse 60  Ht 6' (1.829 m)  Wt 85.276 kg (188 lb)  BMI 25.50 kg/m2  PHYSICAL EXAM: Pt is alert and oriented, NAD HEENT: normal Neck: JVP - normal, carotids 2+= without bruits Lungs: CTA bilaterally CV: RRR without murmur or gallop Abd: soft, NT, Positive BS, no hepatomegaly Ext: no C/C/E, distal pulses intact and equal Skin: warm/dry no rash  ASSESSMENT AND PLAN:

## 2011-11-09 NOTE — Assessment & Plan Note (Signed)
Blood pressure is well controlled on a combination of lisinopril and metoprolol

## 2011-11-09 NOTE — Assessment & Plan Note (Addendum)
The patient is stable without anginal symptoms. He has multivessel coronary artery disease and history of myocardial infarction. I have recommended a followup exercise Myoview stress test for evaluation of residual coronary ischemia. As he approaches one year out from his myocardial infarction, I will plan on discontinuing his effient next month. The patient does physical work, and I'm going to document that he should not work for greater than 8 hours in any day or the temperature is above 85 because of his underlying cardiac disease.

## 2011-11-15 ENCOUNTER — Ambulatory Visit (HOSPITAL_COMMUNITY): Payer: BC Managed Care – PPO | Attending: Cardiovascular Disease | Admitting: Radiology

## 2011-11-15 VITALS — BP 117/84 | Ht 72.0 in | Wt 192.0 lb

## 2011-11-15 DIAGNOSIS — E785 Hyperlipidemia, unspecified: Secondary | ICD-10-CM | POA: Insufficient documentation

## 2011-11-15 DIAGNOSIS — Z9861 Coronary angioplasty status: Secondary | ICD-10-CM | POA: Insufficient documentation

## 2011-11-15 DIAGNOSIS — I252 Old myocardial infarction: Secondary | ICD-10-CM | POA: Insufficient documentation

## 2011-11-15 DIAGNOSIS — R5381 Other malaise: Secondary | ICD-10-CM | POA: Insufficient documentation

## 2011-11-15 DIAGNOSIS — I10 Essential (primary) hypertension: Secondary | ICD-10-CM | POA: Insufficient documentation

## 2011-11-15 DIAGNOSIS — I2510 Atherosclerotic heart disease of native coronary artery without angina pectoris: Secondary | ICD-10-CM | POA: Insufficient documentation

## 2011-11-15 DIAGNOSIS — Z8249 Family history of ischemic heart disease and other diseases of the circulatory system: Secondary | ICD-10-CM | POA: Insufficient documentation

## 2011-11-15 DIAGNOSIS — J45909 Unspecified asthma, uncomplicated: Secondary | ICD-10-CM | POA: Insufficient documentation

## 2011-11-15 MED ORDER — TECHNETIUM TC 99M TETROFOSMIN IV KIT
30.0000 | PACK | Freq: Once | INTRAVENOUS | Status: AC | PRN
  Administered 2011-11-15: 30 via INTRAVENOUS

## 2011-11-15 MED ORDER — TECHNETIUM TC 99M TETROFOSMIN IV KIT
10.0000 | PACK | Freq: Once | INTRAVENOUS | Status: AC | PRN
  Administered 2011-11-15: 10 via INTRAVENOUS

## 2011-11-15 NOTE — Progress Notes (Signed)
[facility] SITE 3 NUCLEAR MED [address] [place] [address] [phone]  Cardiology Nuclear Med Study  Mario Underwood is a 51 y.o. male     MRN : [id number]     DOB: 12-02-1960  Procedure Date: 11/15/2011  Nuclear Med Background Indication for Stress Test:  Evaluation for Ischemia, Stent Patency and PTCA Patency History:  Asthma and '11 Echo/[place]:per patient,enlarged heart;5/12 UX:[id number] wall;Heart catheterization:EF=55% and PTCA/Stent of RCA and LAD Cardiac Risk Factors: Family History - CAD, Hypertension and Lipids  Symptoms:  Fatigue   Nuclear Pre-Procedure Caffeine/Decaff Intake:  None NPO After: 7:30pm   Lungs:  clear O2 Sat: 98% on RA IV 0.9% NS with Angio Cath:  22g  IV Site: R Hand  IV Started by:  Cathlyn Parsons, RN  Chest Size (in):  46 Cup Size: n/a  Height: 6' (1.829 m)  Weight:  192 lb (87.091 kg)  BMI:  Body mass index is 26.04 kg/(m^2). Tech Comments:  Lopressor held x 24 hrs    Nuclear Med Study 1 or 2 day study: 1 day  Stress Test Type:  Stress  Reading MD: [name], MD  Order Authorizing Provider:  Casimiro Needle Cooper,MD  Resting Radionuclide: Technetium 60m Tetrofosmin  Resting Radionuclide Dose: 11.0 mCi   Stress Radionuclide:  Technetium 63m Tetrofosmin  Stress Radionuclide Dose: 32.0 mCi           Stress Protocol Rest HR: 72 Stress HR: 166  Rest BP: 117/84 Stress BP: 170/90  Exercise Time (min): 10:31 METS: 12.5   Predicted Max HR: 172 bpm % Max HR: 96.51 bpm Rate Pressure Product: 72536   Dose of Adenosine (mg):  n/a Dose of Lexiscan: n/a mg  Dose of Atropine (mg): n/a Dose of Dobutamine: n/a mcg/kg/min (at max HR)  Stress Test Technologist: Cathlyn Parsons, RN  Nuclear Technologist:  Domenic Polite, CNMT     Rest Procedure:  Myocardial perfusion imaging was performed at rest 45 minutes following the intravenous administration of Technetium 57m Tetrofosmin. Rest ECG: NSR - Normal EKG;inferior wall  MI  Stress Procedure:  The patient performed treadmill exercise using a Bruce  Protocol for 10:31 minutes. The patient stopped due to Right Knee Pain,target HR achieved and denied any chest pain.  There were no significant ST-T wave changes.  Technetium 36m Tetrofosmin was injected at peak exercise and myocardial perfusion imaging was performed after a brief delay. Stress ECG: No significant ST segment change suggestive of ischemia.  QPS Raw Data Images:  Acquisition technically good; normal left ventricular size. Stress Images:  There is decreased uptake in the inferior wall and apex. Rest Images:  There is decreased uptake in the inferior wall and apex. Subtraction (SDS):  No evidence of ischemia. Transient Ischemic Dilatation (Normal <1.22):  0.90 Lung/Heart Ratio (Normal <0.45):  0.37  Quantitative Gated Spect Images QGS EDV:  145 ml QGS ESV:  73 ml  Impression Exercise Capacity:  Good exercise capacity. BP Response:  Normal blood pressure response. Clinical Symptoms:  No chest pain or dyspnea. ECG Impression:  No significant ST segment change suggestive of ischemia. Comparison with Prior Nuclear Study: No images to compare  Overall Impression:  Abnormal stress nuclear study with small, moderate intensity, fixed inferobasal and apical defects consistent with prior inferobasal infarct and apical thinning; no ischemia.  LV Ejection Fraction: 50%.  LV Wall Motion:  Inferobasal hypokinesis.  [name]

## 2011-11-17 ENCOUNTER — Telehealth: Payer: Self-pay | Admitting: Cardiovascular Disease

## 2011-11-18 NOTE — Telephone Encounter (Signed)
Close  

## 2011-11-28 ENCOUNTER — Other Ambulatory Visit (INDEPENDENT_AMBULATORY_CARE_PROVIDER_SITE_OTHER): Payer: BC Managed Care – PPO

## 2011-11-28 DIAGNOSIS — I2510 Atherosclerotic heart disease of native coronary artery without angina pectoris: Secondary | ICD-10-CM

## 2011-11-28 DIAGNOSIS — E785 Hyperlipidemia, unspecified: Secondary | ICD-10-CM

## 2011-11-28 DIAGNOSIS — I252 Old myocardial infarction: Secondary | ICD-10-CM

## 2011-11-28 DIAGNOSIS — I10 Essential (primary) hypertension: Secondary | ICD-10-CM

## 2011-11-28 LAB — HEPATIC FUNCTION PANEL
ALT: 27 U/L (ref 0–53)
Alkaline Phosphatase: 85 U/L (ref 39–117)
Bilirubin, Direct: 0.1 mg/dL (ref 0.0–0.3)
Total Bilirubin: 0.7 mg/dL (ref 0.3–1.2)
Total Protein: 7.1 g/dL (ref 6.0–8.3)

## 2011-11-28 LAB — BASIC METABOLIC PANEL
BUN: 12 mg/dL (ref 6–23)
Chloride: 107 mEq/L (ref 96–112)
Creatinine, Ser: 0.8 mg/dL (ref 0.4–1.5)
Glucose, Bld: 92 mg/dL (ref 70–99)

## 2011-11-28 LAB — LIPID PANEL
Cholesterol: 127 mg/dL (ref 0–200)
LDL Cholesterol: 61 mg/dL (ref 0–99)

## 2011-12-12 ENCOUNTER — Telehealth: Payer: Self-pay | Admitting: Cardiovascular Disease

## 2011-12-12 NOTE — Telephone Encounter (Signed)
New msg Pt's wife wants to know if he can stop effient. He will run out this Friday. She said he is one year out from getting  Stents . She said Dr Excell Seltzer said he wanted him to take med until June 1. But she wants to know if he still wants him to stop and if ok to do so this week. She said you can leave message on her voice mail

## 2011-12-12 NOTE — Telephone Encounter (Signed)
Spoke with pt, she wanted to know if they have to get the full script if not needed. Explained to pt she can get only the number of tablets they need up to 30 tablets. She voiced understanding for pt to remain on effient until June 1.

## 2012-01-30 ENCOUNTER — Other Ambulatory Visit: Payer: Self-pay | Admitting: Cardiovascular Disease

## 2012-02-26 ENCOUNTER — Other Ambulatory Visit: Payer: Self-pay | Admitting: Cardiovascular Disease

## 2012-05-30 ENCOUNTER — Other Ambulatory Visit: Payer: Self-pay | Admitting: Cardiovascular Disease

## 2012-05-31 ENCOUNTER — Other Ambulatory Visit: Payer: Self-pay | Admitting: Cardiovascular Disease

## 2012-05-31 MED ORDER — ATORVASTATIN CALCIUM 80 MG PO TABS: 40.0000 mg | ORAL_TABLET | Freq: Every day | ORAL | Status: DC

## 2012-07-06 ENCOUNTER — Encounter: Payer: Self-pay | Admitting: Cardiovascular Disease

## 2012-07-06 ENCOUNTER — Ambulatory Visit (INDEPENDENT_AMBULATORY_CARE_PROVIDER_SITE_OTHER): Payer: BC Managed Care – PPO | Admitting: Cardiovascular Disease

## 2012-07-06 VITALS — BP 125/84 | HR 55 | Ht 72.0 in | Wt 211.6 lb

## 2012-07-06 DIAGNOSIS — E785 Hyperlipidemia, unspecified: Secondary | ICD-10-CM

## 2012-07-06 DIAGNOSIS — I2510 Atherosclerotic heart disease of native coronary artery without angina pectoris: Secondary | ICD-10-CM

## 2012-07-06 DIAGNOSIS — I10 Essential (primary) hypertension: Secondary | ICD-10-CM

## 2012-07-06 DIAGNOSIS — E7800 Pure hypercholesterolemia, unspecified: Secondary | ICD-10-CM

## 2012-07-06 LAB — HEPATIC FUNCTION PANEL
ALT: 38 U/L (ref 0–53)
AST: 28 U/L (ref 0–37)
Alkaline Phosphatase: 83 U/L (ref 39–117)
Bilirubin, Direct: 0.1 mg/dL (ref 0.0–0.3)
Total Bilirubin: 0.9 mg/dL (ref 0.3–1.2)
Total Protein: 7.2 g/dL (ref 6.0–8.3)

## 2012-07-06 LAB — BASIC METABOLIC PANEL
BUN: 12 mg/dL (ref 6–23)
Creatinine, Ser: 0.9 mg/dL (ref 0.4–1.5)
GFR: 101.64 mL/min (ref >60.00)
Glucose, Bld: 98 mg/dL (ref 70–99)

## 2012-07-06 LAB — LIPID PANEL: Cholesterol: 123 mg/dL (ref 0–200)

## 2012-07-06 MED ORDER — ATORVASTATIN CALCIUM 80 MG PO TABS: 40.0000 mg | ORAL_TABLET | Freq: Every day | ORAL | Status: DC

## 2012-07-06 MED ORDER — NITROGLYCERIN 0.4 MG SL SUBL: 0.4000 mg | SUBLINGUAL_TABLET | SUBLINGUAL | Status: DC | PRN

## 2012-07-06 MED ORDER — METOPROLOL TARTRATE 25 MG PO TABS: 25.0000 mg | ORAL_TABLET | Freq: Two times a day (BID) | ORAL | Status: DC

## 2012-07-06 MED ORDER — LISINOPRIL 5 MG PO TABS: 5.0000 mg | ORAL_TABLET | Freq: Every day | ORAL | Status: DC

## 2012-07-06 MED ORDER — ALBUTEROL SULFATE HFA 108 (90 BASE) MCG/ACT IN AERS: 1.0000 | INHALATION_SPRAY | Freq: Two times a day (BID) | RESPIRATORY_TRACT | Status: AC | PRN

## 2012-07-06 NOTE — Patient Instructions (Addendum)
Your physician wants you to follow-up in 6 MONTHS with Dr Excell Seltzer.  You will receive a reminder letter in the mail two months in advance. If you don't receive a letter, please call our office to schedule the follow-up appointment.  Your physician recommends that you have a FASTING LIPID, LIVER and BMP today.  Your physician recommends that you continue on your current medications as directed. Please refer to the Current Medication list given to you today.

## 2012-07-06 NOTE — Progress Notes (Signed)
HPI:  This is a 51 year old gentleman presenting for followup evaluation. 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 The patient underwent stenting of a severe stenosis in the right coronary artery and was treated with a long drug-eluting stent platform. He was again treated with a drug-eluting stent in the LAD during his initial hospitalization. He has had no recurrent heart problems since that time. Last lipids May 2013 showed cholesterol 127, HDL, 49, LDL 61.  From a CV perspective he is doing well. Denies CP,m dyspnea, edema, or palpitations. Remains limited from knee pain. He is fearful of proceeding with surgery. Remains worried about his cardiac condition despite his lack of symptoms and reassurance from me.  Outpatient Encounter Prescriptions as of 07/06/2012  Medication Sig Dispense Refill  . aspirin 81 MG tablet Take 81 mg by mouth daily.        Marland Kitchen atorvastatin (LIPITOR) 80 MG tablet Take 0.5 tablets (40 mg total) by mouth daily.  30 tablet  6  . Glucosamine-Chondroitin-MSM 500-200-150 MG TABS Take by mouth.        Marland Kitchen lisinopril (PRINIVIL,ZESTRIL) 5 MG tablet TAKE 1 TABLET DAILY.  30 tablet  6  . metoprolol tartrate (LOPRESSOR) 25 MG tablet TAKE 1 TABLET 2 TIMES DAILY.  60 tablet  6  . Multiple Vitamin (DAILY MULTIVITAMIN PO) Take by mouth daily.        . nitroGLYCERIN (NITROSTAT) 0.4 MG SL tablet Place 0.4 mg under the tongue every 5 (five) minutes as needed.        . Omega-3 Fatty Acids (FISH OIL) 1000 MG CAPS Take by mouth daily.      . [DISCONTINUED] atorvastatin (LIPITOR) 80 MG tablet Take 0.5 tablets (40 mg total) by mouth daily.  15 tablet  3  . [DISCONTINUED] prasugrel (EFFIENT) 10 MG TABS Take 1 tablet (10 mg total) by mouth daily.  30 tablet  11    Allergies  Allergen Reactions  . Penicillins     Past Medical History  Diagnosis Date  . CAD (coronary artery disease)     A.  Inferior STEMI 12/12/10, treated with a Xience DES;  B.  Staged PCI of the LAD with a Promus  DES;  C.  EF 55%, inferior AK  . Hypertension   . Hyperlipemia   . Asthma   . Osteoarthritis     Right knee TKR pending with Dr. Charlann Boxer    ROS: Negative except as per HPI  BP 125/84  Pulse 55  Ht 6' (1.829 m)  Wt 95.981 kg (211 lb 9.6 oz)  BMI 28.70 kg/m2  PHYSICAL EXAM: Pt is alert and oriented, NAD HEENT: normal Neck: JVP - normal, carotids 2+= without bruits Lungs: CTA bilaterally CV: RRR without murmur or gallop Abd: soft, NT, Positive BS, no hepatomegaly Ext: no C/C/E, distal pulses intact and equal Skin: warm/dry no rash  EKG:  Sinus brady 55 bpm, inferior MI age-undetermined.  ASSESSMENT AND PLAN: 1. CAD, native vessel. Stable without anginal symptoms. Remains on a good regimen with ASA, atorvastatin, lisinopril, and metoprolol. From a CV perspective, he is at low risk of proceeding with knee replacement surgery if needed. He had a Myoview scan one year after his MI and there was no significant ischemia. He remains asymptomatic without anginal symptoms.  2. Hyperlipidemia - as above lipids are excellent and he remains on a statin drug. Will repeat labs as he is fasting today.  Tonny Bollman 07/10/2012 12:10 AM

## 2012-07-10 ENCOUNTER — Encounter: Payer: Self-pay | Admitting: Cardiovascular Disease

## 2012-08-25 ENCOUNTER — Other Ambulatory Visit: Payer: Self-pay | Admitting: Cardiovascular Disease

## 2012-08-28 ENCOUNTER — Other Ambulatory Visit: Payer: Self-pay | Admitting: Cardiovascular Disease

## 2012-09-25 ENCOUNTER — Other Ambulatory Visit: Payer: Self-pay | Admitting: Cardiovascular Disease

## 2012-11-05 ENCOUNTER — Encounter: Payer: Self-pay | Admitting: Cardiovascular Disease

## 2012-11-05 NOTE — Telephone Encounter (Signed)
This encounter was created in error - please disregard.

## 2012-11-05 NOTE — Telephone Encounter (Signed)
New problem     Need lab work on same day when he comes to appt in June.

## 2013-01-02 ENCOUNTER — Ambulatory Visit (INDEPENDENT_AMBULATORY_CARE_PROVIDER_SITE_OTHER): Payer: BC Managed Care – PPO | Admitting: Cardiovascular Disease

## 2013-01-02 ENCOUNTER — Encounter: Payer: Self-pay | Admitting: Cardiovascular Disease

## 2013-01-02 VITALS — BP 150/98 | HR 57 | Ht 71.0 in | Wt 220.0 lb

## 2013-01-02 DIAGNOSIS — I2510 Atherosclerotic heart disease of native coronary artery without angina pectoris: Secondary | ICD-10-CM

## 2013-01-02 DIAGNOSIS — E785 Hyperlipidemia, unspecified: Secondary | ICD-10-CM

## 2013-01-02 DIAGNOSIS — I10 Essential (primary) hypertension: Secondary | ICD-10-CM

## 2013-01-02 LAB — HEPATIC FUNCTION PANEL
Alkaline Phosphatase: 82 U/L (ref 39–117)
Bilirubin, Direct: 0.1 mg/dL (ref 0.0–0.3)
Total Bilirubin: 1.1 mg/dL (ref 0.3–1.2)
Total Protein: 7.5 g/dL (ref 6.0–8.3)

## 2013-01-02 LAB — LIPID PANEL
Total CHOL/HDL Ratio: 3
Triglycerides: 114 mg/dL (ref 0.0–149.0)

## 2013-01-02 NOTE — Patient Instructions (Signed)
Your physician recommends that you have lab work today: LIPID and LIVER  Your physician wants you to follow-up in: 1 YEAR with Dr Excell Seltzer.  You will receive a reminder letter in the mail two months in advance. If you don't receive a letter, please call our office to schedule the follow-up appointment.  Your physician has requested that you regularly monitor and record your blood pressure readings at home. Please use the same machine at the same time of day to check your readings and record them to bring to your follow-up visit. Please call the office if your BP is running higher than 140/90 on a consistent basis.

## 2013-01-02 NOTE — Progress Notes (Signed)
HPI:  52 year old gentleman presenting for followup evaluation. The patient is followed for coronary artery disease. He presented initially in 2012 with an acute inferior wall MI and was treated with a drug-eluting stent in the right coronary artery. He had staged PCI of the proximal LAD. He's had no recurrent ischemic event since that time. His last nuclear scan was in 2013 and this demonstrated a fixed defect in the inferobasal and apical walls without significant ischemia. His left ventricular ejection fraction was 50%.  The patient's doing well from a cardiac perspective. He has gained close to 10 pounds since his last visit and he knows that he has slipped up with his diet. He's tried to stay active but is limited by knee pain. He continues to show reluctance towards knee replacement surgery. He is fearful of his cardiac risk. He denies chest pain, chest pressure, dyspnea, or palpitations. He's been compliant with his medications. His blood pressure at home has been ranging around 130/80.  Outpatient Encounter Prescriptions as of 01/02/2013  Medication Sig Dispense Refill  . albuterol (PROVENTIL HFA;VENTOLIN HFA) 108 (90 BASE) MCG/ACT inhaler Inhale 1 puff into the lungs 2 (two) times daily as needed for wheezing.  1 Inhaler  2  . aspirin 81 MG tablet Take 81 mg by mouth daily.        Marland Kitchen atorvastatin (LIPITOR) 80 MG tablet TAKE 1/2 TABLET BY MOUTH DAILY.  15 tablet  3  . Glucosamine-Chondroitin-MSM 500-200-150 MG TABS Take by mouth.        Marland Kitchen lisinopril (PRINIVIL,ZESTRIL) 5 MG tablet Take 1 tablet (5 mg total) by mouth daily.  30 tablet  11  . metoprolol tartrate (LOPRESSOR) 25 MG tablet Take 1 tablet (25 mg total) by mouth 2 (two) times daily.  60 tablet  11  . Multiple Vitamin (DAILY MULTIVITAMIN PO) Take by mouth daily.        . nitroGLYCERIN (NITROSTAT) 0.4 MG SL tablet Place 1 tablet (0.4 mg total) under the tongue every 5 (five) minutes as needed.  25 tablet  11  . Omega-3 Fatty Acids (FISH  OIL) 1000 MG CAPS Take by mouth daily.      . [DISCONTINUED] atorvastatin (LIPITOR) 80 MG tablet Take 0.5 tablets (40 mg total) by mouth daily.  30 tablet  6  . [DISCONTINUED] lisinopril (PRINIVIL,ZESTRIL) 5 MG tablet TAKE 1 TABLET DAILY.  30 tablet  6  . [DISCONTINUED] lisinopril (PRINIVIL,ZESTRIL) 5 MG tablet TAKE 1 TABLET DAILY.  30 tablet  6  . [DISCONTINUED] metoprolol tartrate (LOPRESSOR) 25 MG tablet TAKE 1 TABLET 2 TIMES DAILY.  60 tablet  6  . [DISCONTINUED] metoprolol tartrate (LOPRESSOR) 25 MG tablet TAKE 1 TABLET 2 TIMES DAILY.  60 tablet  6   No facility-administered encounter medications on file as of 01/02/2013.    Allergies  Allergen Reactions  . Penicillins     Past Medical History  Diagnosis Date  . CAD (coronary artery disease)     A.  Inferior STEMI 12/12/10, treated with a Xience DES;  B.  Staged PCI of the LAD with a Promus DES;  C.  EF 55%, inferior AK  . Hypertension   . Hyperlipemia   . Asthma   . Osteoarthritis     Right knee TKR pending with Dr. Charlann Boxer    ROS: Negative except as per HPI  BP 150/98  Pulse 57  Ht 5\' 11"  (1.803 m)  Wt 99.791 kg (220 lb)  BMI 30.7 kg/m2  PHYSICAL EXAM: Pt is  alert and oriented, NAD HEENT: normal Neck: JVP - normal, carotids 2+= without bruits Lungs: CTA bilaterally CV: RRR without murmur or gallop Abd: soft, NT, Positive BS, no hepatomegaly Ext: no C/C/E, distal pulses intact and equal Skin: warm/dry no rash  EKG:  Sinus rhythm 57 beats per minute, age-indeterminate inferior infarction, no other abnormalities noted.  ASSESSMENT AND PLAN: 1. Coronary artery disease, native vessel. The patient is stable on his current medical program. Will continue the same medications without changes. He was encouraged to and for weight loss and work on his diet. He seems motivated to do this.  2. Hyperlipidemia. Previous lipids reviewed. He's on aggressive lipid-lowering with a high-intensity statin drug. Will repeat lipids and LFTs  today.  3. Hypertension. I rechecked his blood pressure and on my check it was 128/88. We've asked him to continue to follow his blood pressure at home and call us if readings are greater than 140/90. His blood pressure trends have been in a good range when looking at his office visits.  For followup I will see him back in one year.  Tonny Bollman 01/02/2013 9:37 AM

## 2013-01-10 ENCOUNTER — Telehealth: Payer: Self-pay | Admitting: Cardiovascular Disease

## 2013-01-10 NOTE — Telephone Encounter (Signed)
Follow Up      Pt calling in following up on phone call from earlier. Please call back.

## 2013-01-10 NOTE — Telephone Encounter (Signed)
Spoke with pt and reviewed lipid and liver results with him.  

## 2013-01-27 ENCOUNTER — Other Ambulatory Visit: Payer: Self-pay | Admitting: Cardiovascular Disease

## 2013-02-05 ENCOUNTER — Telehealth: Payer: Self-pay

## 2013-02-05 NOTE — Telephone Encounter (Signed)
pt wife called to inq on refills for pt metoprolol and lisinopril.advised her refills were already authorized through 06/2013.advised her whwn calling in refills through automated system using rx# if it is an old # it might indicate no more refills. pt wife will speak to the pharmacist.she sts she was"trying to be proactive"in calling his rx in early

## 2013-03-19 ENCOUNTER — Other Ambulatory Visit: Payer: Self-pay | Admitting: Cardiovascular Disease

## 2013-05-21 ENCOUNTER — Telehealth: Payer: Self-pay | Admitting: Cardiovascular Disease

## 2013-05-21 NOTE — Telephone Encounter (Signed)
New message    At orthopedic office now---they want to know if pt can take celebrex.  They live an hour away and want to get an ans while at the orthopedic's office--if possible

## 2013-05-21 NOTE — Telephone Encounter (Signed)
Told patient this is fine. Asked him to try to keep duration as short as possible. He plans on knee replacement after the holidays.

## 2013-05-21 NOTE — Telephone Encounter (Signed)
I spoke with the pt's wife and made her aware that Dr Excell Seltzer is not in the office today.  The orthopedic MD is going to give the pt a Rx for Celebrex 200mg  daily.  I advised her to hold off on filling this medication until I can get a response from Dr Excell Seltzer.  I will touch base with the pt when I have an answer.

## 2013-05-30 ENCOUNTER — Other Ambulatory Visit: Payer: Self-pay

## 2013-06-04 ENCOUNTER — Ambulatory Visit (INDEPENDENT_AMBULATORY_CARE_PROVIDER_SITE_OTHER): Payer: 59 | Admitting: Nurse Practitioner

## 2013-06-04 ENCOUNTER — Encounter: Payer: Self-pay | Admitting: Nurse Practitioner

## 2013-06-04 VITALS — BP 130/98 | HR 57 | Ht 71.0 in | Wt 214.1 lb

## 2013-06-04 DIAGNOSIS — I2510 Atherosclerotic heart disease of native coronary artery without angina pectoris: Secondary | ICD-10-CM

## 2013-06-04 DIAGNOSIS — Z01818 Encounter for other preprocedural examination: Secondary | ICD-10-CM

## 2013-06-04 DIAGNOSIS — E7800 Pure hypercholesterolemia, unspecified: Secondary | ICD-10-CM

## 2013-06-04 MED ORDER — METOPROLOL TARTRATE 25 MG PO TABS: 25.0000 mg | ORAL_TABLET | Freq: Two times a day (BID) | ORAL | Status: DC

## 2013-06-04 MED ORDER — NITROGLYCERIN 0.4 MG SL SUBL: 0.4000 mg | SUBLINGUAL_TABLET | SUBLINGUAL | Status: DC | PRN

## 2013-06-04 MED ORDER — LISINOPRIL 5 MG PO TABS: 5.0000 mg | ORAL_TABLET | Freq: Every day | ORAL | Status: DC

## 2013-06-04 MED ORDER — ATORVASTATIN CALCIUM 80 MG PO TABS: ORAL_TABLET | ORAL | Status: DC

## 2013-06-04 NOTE — Progress Notes (Signed)
Mario Underwood Date of Birth: 1960/09/28 Medical Record #[id number]  History of Present Illness: Mario Underwood is seen back today for a pre op clearance visit. Seen for Mario Underwood. He has known CAD with past inferior wall MI and was treated with DES to the RCA in 2012, followed by staged PCI of the proximal LAD. Lst nuclear was in 2013 showing a fixed defecct in the inferobasal and apical walls without significant ischemia. EF was 50%. Other isues include OA, HLD, and HTN.   Comes in today. Here with his wife. Planning on right TKR with Mario Underwood. Not scheduled yet - but hoping to have after the holidays. Doing ok from a cardiac standpoint. No chest pain. Not short of breath. No syncope. Walking a lot with work. Energy level is good. Not dizzy or lightheaded. BP has usually been good at home but he admits that he does not check as he should. Has been using NSAIDs along with Celebrex for his knee pain.   Current Outpatient Prescriptions  Medication Sig Dispense Refill  . albuterol (PROVENTIL HFA;VENTOLIN HFA) 108 (90 BASE) MCG/ACT inhaler Inhale 1 puff into the lungs 2 (two) times daily as needed for wheezing.  1 Inhaler  2  . aspirin 81 MG tablet Take 81 mg by mouth daily.        Marland Kitchen atorvastatin (LIPITOR) 80 MG tablet TAKE 1/2 TABLET EVERY DAY  15 tablet  3  . Glucosamine-Chondroitin-MSM 500-200-150 MG TABS Take by mouth.        Marland Kitchen lisinopril (PRINIVIL,ZESTRIL) 5 MG tablet Take 1 tablet (5 mg total) by mouth daily.  30 tablet  11  . metoprolol tartrate (LOPRESSOR) 25 MG tablet Take 1 tablet (25 mg total) by mouth 2 (two) times daily.  60 tablet  11  . Multiple Vitamin (DAILY MULTIVITAMIN PO) Take by mouth daily.        . nitroGLYCERIN (NITROSTAT) 0.4 MG SL tablet Place 1 tablet (0.4 mg total) under the tongue every 5 (five) minutes as needed.  25 tablet  11  . Omega-3 Fatty Acids (FISH OIL) 1000 MG CAPS Take by mouth daily.       No current facility-administered medications for this visit.     Allergies  Allergen Reactions  . Penicillins     Past Medical History  Diagnosis Date  . CAD (coronary artery disease)     A.  Inferior STEMI 12/12/10, treated with a Xience DES;  B.  Staged PCI of the LAD with a Promus DES;  C.  EF 55%, inferior AK  . Hypertension   . Hyperlipemia   . Asthma   . Osteoarthritis     Right knee TKR pending with Mario Underwood    Past Surgical History  Procedure Laterality Date  . Coronary angioplasty      History  Smoking status  . Never Smoker   Smokeless tobacco  . Not on file    History  Alcohol Use  . Yes    Comment: weekends    Family History  Problem Relation Age of Onset  . Coronary artery disease Sister     Review of Systems: The review of systems is per the HPI.  All other systems were reviewed and are negative.  Physical Exam: BP 130/98  Pulse 57  Ht 5\' 11"  (1.803 m)  Wt 189 lb (85.73 kg)  BMI 26.37 kg/m2 Weight is actually 214.  Patient is very pleasant and in no acute distress. Overweight. Skin is warm and  dry. Color is normal.  HEENT is unremarkable. Normocephalic/atraumatic. PERRL. Sclera are nonicteric. Neck is supple. No masses. No JVD. Lungs are clear. Cardiac exam shows a regular rate and rhythm. Abdomen is soft. Extremities are without edema. Gait and ROM are intact. No gross neurologic deficits noted.  Wt Readings from Last 3 Encounters:  06/04/13 189 lb (85.73 kg)  01/02/13 220 lb (99.791 kg)  07/06/12 211 lb 9.6 oz (95.981 kg)     LABORATORY DATA: EKG today shows sinus rhythm with prior inferior infarct.   Lab Results  Component Value Date   WBC 8.8 12/23/2010   HGB 14.0 12/23/2010   HCT 42.0 12/23/2010   PLT 206 12/23/2010   GLUCOSE 98 07/06/2012   CHOL 126 01/02/2013   TRIG 114.0 01/02/2013   HDL 40.20 01/02/2013   LDLDIRECT  Value: 82 (NOTE) ATP III Classification (LDL):      < 100        mg/dL         Optimal     161 - 129     mg/dL         Near or Above Optimal     130 - 159     mg/dL          Borderline High     160 - 189     mg/dL         High      > 096        mg/dL         Very  High  0/45/4098   LDLCALC 63 01/02/2013   ALT 28 01/02/2013   AST 27 01/02/2013   NA 136 07/06/2012   K 4.3 07/06/2012   CL 102 07/06/2012   CREATININE 0.9 07/06/2012   BUN 12 07/06/2012   CO2 29 07/06/2012   INR 1.01 12/23/2010   HGBA1C  Value: 5.4 (NOTE)                                                                       According to the ADA Clinical Practice Recommendations for 2011, when HbA1c is used as a screening test:   >=6.5%   Diagnostic of Diabetes Mellitus           (if abnormal result  is confirmed)  5.7-6.4%   Increased risk of developing Diabetes Mellitus  References:Diagnosis and Classification of Diabetes Mellitus,Diabetes Care,2011,34(Suppl 1):S62-S69 and Standards of Medical Care in         Diabetes - 2011,Diabetes Care,2011,34  (Suppl 1):S11-S61. 12/21/2010   Myoview Impression from April of 2013  Exercise Capacity: Good exercise capacity.  BP Response: Normal blood pressure response.  Clinical Symptoms: No chest pain or dyspnea.  ECG Impression: No significant ST segment change suggestive of ischemia.  Comparison with Prior Nuclear Study: No images to compare  Overall Impression: Abnormal stress nuclear study with small, moderate intensity, fixed inferobasal and apical defects consistent with prior inferobasal infarct and apical thinning; no ischemia.  LV Ejection Fraction: 50%. LV Wall Motion: Inferobasal hypokinesis.  Mario Underwood  Assessment / Plan: 1. Pre op clearance - I think he is an acceptable candidate for his knee surgery. Will be available as needed.   2. CAD with prior MI/PCI  to RCA and proximal LAD - EF of 50% - no chest pain. Negative Myoview for ischemia in April of 2013. Medicines are refilled today.  3. HTN - BP is up - 140/100 by me - he is agreeable to monitoring at home - goal less than 135/85 - he will call Mario Underwood - Mario Underwood nurse with an update in the  next few weeks. I suspect some of this is from his NSAID use - he has just taken his last Celebrex.   4. HLD - on statin therapy.   Patient is agreeable to this plan and will call if any problems develop in the interim.   Rosalio Macadamia, RN, ANP-C [facility] [address] [place], [place]  [address]

## 2013-06-04 NOTE — Patient Instructions (Addendum)
Continue with your current medicines  Monitor your blood pressure at home - goal is less than 135/85  Call Lauren - Dr. Earmon Phoenix nurse - with an update on your blood pressures in the next 2 to 3 weeks  See Dr. Excell Seltzer back as planned  I will send a note to Dr. Sherlean Foot  Call the [facility] office at [phone] if you have any questions, problems or concerns.

## 2013-09-23 ENCOUNTER — Other Ambulatory Visit: Payer: Self-pay | Admitting: Orthopedic Surgery

## 2013-09-25 ENCOUNTER — Encounter (HOSPITAL_COMMUNITY): Payer: Self-pay | Admitting: Pharmacy Technician

## 2013-09-27 ENCOUNTER — Other Ambulatory Visit (HOSPITAL_COMMUNITY): Payer: Self-pay | Admitting: *Deleted

## 2013-09-27 NOTE — Pre-Procedure Instructions (Signed)
Sherie DonKenneth R Rumer  09/27/2013   Your procedure is scheduled on:  Monday, October 07, 2013 at 7:30 AM.   [facility]  at 5:30 AM.   Call this number if you have problems the morning of surgery: [phone]   Remember:   Do not eat food or drink liquids after midnight Sunday, 10/06/13.   Take these medicines the morning of surgery with A SIP OF WATER: metoprolol tartrate (LOPRESSOR), nitroGLYCERIN (NITROSTAT) - if needed. You may use your albuterol (PROVENTIL HFA;VENTOLIN HFA) if needed and please bring with you the day of surgery.  Stop all Vitamins, Herbal medications, Fish Oil and Aspirin as of today.    Do not wear jewelry.  Do not wear lotions, powders, or cologne. You may wear deodorant.  Men may shave face and neck.  Do not bring valuables to the hospital.  [facility] is not responsible for any belongings or valuables.               Contacts, dentures or bridgework may not be worn into surgery.  Leave suitcase in the car. After surgery it may be brought to your room.  For patients admitted to the hospital, discharge time is determined by your treatment team.           Special Instructions: [facility] - Preparing for Surgery  Before surgery, you can play an important role.  Because skin is not sterile, your skin needs to be as free of germs as possible.  You can reduce the number of germs on you skin by washing with CHG (chlorahexidine gluconate) soap before surgery.  CHG is an antiseptic cleaner which kills germs and bonds with the skin to continue killing germs even after washing.  Please DO NOT use if you have an allergy to CHG or antibacterial soaps.  If your skin becomes reddened/irritated stop using the CHG and inform your nurse when you arrive at Short Stay.  Do not shave (including legs and underarms) for at least 48 hours prior to the first CHG shower.  You may shave your face.  Please follow these instructions  carefully:   1.  Shower with CHG Soap the night before surgery and the morning of Surgery.  2.  If you choose to wash your hair, wash your hair first as usual with your normal shampoo.  3.  After you shampoo, rinse your hair and body thoroughly to remove the Shampoo.  4.  Use CHG as you would any other liquid soap.  You can apply chg directly to the skin and wash gently with scrungie or a clean washcloth.  5.  Apply the CHG Soap to your body ONLY FROM THE NECK DOWN.  Do not use on open wounds or open sores.  Avoid contact with your eyes, ears, mouth and genitals (private parts).  Wash genitals (private parts) with your normal soap.  6.  Wash thoroughly, paying special attention to the area where your surgery will be performed.  7.  Thoroughly rinse your body with warm water from the neck down.  8.  DO NOT shower/wash with your normal soap after using and rinsing off the CHG Soap.  9.  Pat yourself dry with a clean towel.            10.  Wear clean pajamas.            11 .  Place clean sheets on your bed the night of your first shower and  do not sleep with pets.  Day of Surgery  Do not apply any lotions the morning of surgery.  Please wear clean clothes to the hospital/surgery center.     Please read over the following fact sheets that you were given: Pain Booklet, Coughing and Deep Breathing, Blood Transfusion Information, MRSA Information and Surgical Site Infection Prevention

## 2013-09-30 ENCOUNTER — Encounter (HOSPITAL_COMMUNITY)
Admission: RE | Admit: 2013-09-30 | Discharge: 2013-09-30 | Disposition: A | Discharge status: Home / Self Care | Payer: 59 | Source: Ambulatory Visit | Attending: Orthopedic Surgery | Admitting: Orthopedic Surgery

## 2013-09-30 ENCOUNTER — Encounter (HOSPITAL_COMMUNITY): Payer: Self-pay

## 2013-09-30 DIAGNOSIS — Z01812 Encounter for preprocedural laboratory examination: Secondary | ICD-10-CM | POA: Insufficient documentation

## 2013-09-30 DIAGNOSIS — Z01818 Encounter for other preprocedural examination: Secondary | ICD-10-CM | POA: Insufficient documentation

## 2013-09-30 HISTORY — DX: Acute myocardial infarction, unspecified: I21.9

## 2013-09-30 LAB — URINALYSIS, ROUTINE W REFLEX MICROSCOPIC
Bilirubin Urine: NEGATIVE
Glucose, UA: NEGATIVE mg/dL
HGB URINE DIPSTICK: NEGATIVE
KETONES UR: NEGATIVE mg/dL
Leukocytes, UA: NEGATIVE
Nitrite: NEGATIVE
Protein, ur: NEGATIVE mg/dL
Specific Gravity, Urine: 1.027 (ref 1.005–1.030)
Urobilinogen, UA: 0.2 mg/dL (ref 0.0–1.0)
pH: 5.5 (ref 5.0–8.0)

## 2013-09-30 LAB — CBC WITH DIFFERENTIAL/PLATELET
Basophils Absolute: 0 10*3/uL (ref 0.0–0.1)
Basophils Relative: 0 % (ref 0–1)
EOS ABS: 0 10*3/uL (ref 0.0–0.7)
Eosinophils Relative: 0 % (ref 0–5)
HCT: 47.7 % (ref 39.0–52.0)
HEMOGLOBIN: 16.4 g/dL (ref 13.0–17.0)
LYMPHS ABS: 2.5 10*3/uL (ref 0.7–4.0)
Lymphocytes Relative: 16 % (ref 12–46)
MCH: 31.5 pg (ref 26.0–34.0)
MCHC: 34.4 g/dL (ref 30.0–36.0)
MCV: 91.6 fL (ref 78.0–100.0)
Monocytes Absolute: 1 10*3/uL (ref 0.1–1.0)
Monocytes Relative: 7 % (ref 3–12)
NEUTROS ABS: 11.7 10*3/uL — AB (ref 1.7–7.7)
Neutrophils Relative %: 77 % (ref 43–77)
Platelets: 215 10*3/uL (ref 150–400)
RBC: 5.21 MIL/uL (ref 4.22–5.81)
RDW: 13.5 % (ref 11.5–15.5)
WBC: 15.1 10*3/uL — ABNORMAL HIGH (ref 4.0–10.5)

## 2013-09-30 LAB — APTT: aPTT: 27 seconds (ref 24–37)

## 2013-09-30 LAB — PROTIME-INR
INR: 1.08 (ref 0.00–1.49)
Prothrombin Time: 13.8 seconds (ref 11.6–15.2)

## 2013-09-30 LAB — TYPE AND SCREEN
ABO/RH(D): B POS
ANTIBODY SCREEN: NEGATIVE

## 2013-09-30 LAB — COMPREHENSIVE METABOLIC PANEL
ALK PHOS: 109 U/L (ref 39–117)
ALT: 20 U/L (ref 0–53)
AST: 19 U/L (ref 0–37)
Albumin: 4 g/dL (ref 3.5–5.2)
BUN: 14 mg/dL (ref 6–23)
CHLORIDE: 104 meq/L (ref 96–112)
CO2: 25 mEq/L (ref 19–32)
CREATININE: 0.79 mg/dL (ref 0.50–1.35)
Calcium: 9.6 mg/dL (ref 8.4–10.5)
GFR calc non Af Amer: >90 mL/min (ref >90)
GLUCOSE: 104 mg/dL — AB (ref 70–99)
POTASSIUM: 4.3 meq/L (ref 3.7–5.3)
Sodium: 143 mEq/L (ref 137–147)
Total Bilirubin: 0.4 mg/dL (ref 0.3–1.2)
Total Protein: 7.1 g/dL (ref 6.0–8.3)

## 2013-09-30 LAB — SURGICAL PCR SCREEN
MRSA, PCR: NEGATIVE
Staphylococcus aureus: NEGATIVE

## 2013-10-01 LAB — URINE CULTURE
Colony Count: NO GROWTH
Culture: NO GROWTH

## 2013-10-01 LAB — ABO/RH: ABO/RH(D): B POS

## 2013-10-06 MED ORDER — VANCOMYCIN HCL IN DEXTROSE 1-5 GM/200ML-% IV SOLN
1000.0000 mg | INTRAVENOUS | Status: AC
  Administered 2013-10-07: 1000 mg via INTRAVENOUS
  Filled 2013-10-06: qty 200

## 2013-10-06 MED ORDER — TRANEXAMIC ACID 100 MG/ML IV SOLN
1000.0000 mg | INTRAVENOUS | Status: AC
  Administered 2013-10-07: 1000 mg via INTRAVENOUS
  Filled 2013-10-06: qty 10

## 2013-10-06 MED ORDER — BUPIVACAINE LIPOSOME 1.3 % IJ SUSP
20.0000 mL | Freq: Once | INTRAMUSCULAR | Status: DC
  Filled 2013-10-06: qty 20

## 2013-10-07 ENCOUNTER — Encounter (HOSPITAL_COMMUNITY): Payer: Commercial Managed Care - PPO | Admitting: Critical Care Medicine

## 2013-10-07 ENCOUNTER — Ambulatory Visit (HOSPITAL_COMMUNITY): Payer: Commercial Managed Care - PPO | Admitting: Critical Care Medicine

## 2013-10-07 ENCOUNTER — Encounter (HOSPITAL_COMMUNITY): Payer: Self-pay | Admitting: Critical Care Medicine

## 2013-10-07 ENCOUNTER — Inpatient Hospital Stay (HOSPITAL_COMMUNITY)
Admission: RE | Admit: 2013-10-07 | Discharge: 2013-10-08 | DRG: 470 | Disposition: A | Discharge status: Home Health | Payer: Commercial Managed Care - PPO | Source: Ambulatory Visit | Attending: Orthopedic Surgery | Admitting: Orthopedic Surgery

## 2013-10-07 ENCOUNTER — Encounter (HOSPITAL_COMMUNITY)
Admission: RE | Disposition: A | Discharge status: Home Health | Payer: Self-pay | Source: Ambulatory Visit | Attending: Orthopedic Surgery

## 2013-10-07 DIAGNOSIS — I252 Old myocardial infarction: Secondary | ICD-10-CM

## 2013-10-07 DIAGNOSIS — Z9861 Coronary angioplasty status: Secondary | ICD-10-CM

## 2013-10-07 DIAGNOSIS — M1710 Unilateral primary osteoarthritis, unspecified knee: Principal | ICD-10-CM | POA: Diagnosis present

## 2013-10-07 DIAGNOSIS — Z79899 Other long term (current) drug therapy: Secondary | ICD-10-CM

## 2013-10-07 DIAGNOSIS — I2510 Atherosclerotic heart disease of native coronary artery without angina pectoris: Secondary | ICD-10-CM | POA: Diagnosis present

## 2013-10-07 DIAGNOSIS — Z96659 Presence of unspecified artificial knee joint: Secondary | ICD-10-CM

## 2013-10-07 DIAGNOSIS — E785 Hyperlipidemia, unspecified: Secondary | ICD-10-CM | POA: Diagnosis present

## 2013-10-07 DIAGNOSIS — I10 Essential (primary) hypertension: Secondary | ICD-10-CM | POA: Diagnosis present

## 2013-10-07 DIAGNOSIS — J45909 Unspecified asthma, uncomplicated: Secondary | ICD-10-CM | POA: Diagnosis present

## 2013-10-07 DIAGNOSIS — D62 Acute posthemorrhagic anemia: Secondary | ICD-10-CM | POA: Diagnosis not present

## 2013-10-07 HISTORY — PX: TOTAL KNEE ARTHROPLASTY: SHX125

## 2013-10-07 LAB — CBC
HEMATOCRIT: 45.7 % (ref 39.0–52.0)
HEMOGLOBIN: 15.4 g/dL (ref 13.0–17.0)
MCH: 31.1 pg (ref 26.0–34.0)
MCHC: 33.7 g/dL (ref 30.0–36.0)
MCV: 92.3 fL (ref 78.0–100.0)
Platelets: 216 10*3/uL (ref 150–400)
RBC: 4.95 MIL/uL (ref 4.22–5.81)
RDW: 14 % (ref 11.5–15.5)
WBC: 20.7 10*3/uL — AB (ref 4.0–10.5)

## 2013-10-07 LAB — CREATININE, SERUM
Creatinine, Ser: 0.83 mg/dL (ref 0.50–1.35)
GFR calc Af Amer: >90 mL/min (ref >90)

## 2013-10-07 SURGERY — ARTHROPLASTY, KNEE, TOTAL
Anesthesia: Regional | Site: Knee | Laterality: Right

## 2013-10-07 MED ORDER — ACETAMINOPHEN 650 MG RE SUPP: 650.0000 mg | Freq: Four times a day (QID) | RECTAL | Status: DC | PRN

## 2013-10-07 MED ORDER — CELECOXIB 200 MG PO CAPS
200.0000 mg | ORAL_CAPSULE | Freq: Two times a day (BID) | ORAL | Status: DC
  Administered 2013-10-07 – 2013-10-08 (×3): 200 mg via ORAL
  Filled 2013-10-07 (×4): qty 1

## 2013-10-07 MED ORDER — FENTANYL CITRATE 0.05 MG/ML IJ SOLN
INTRAMUSCULAR | Status: AC
  Filled 2013-10-07: qty 5

## 2013-10-07 MED ORDER — HYDROMORPHONE HCL PF 1 MG/ML IJ SOLN
INTRAMUSCULAR | Status: AC
  Administered 2013-10-07: 10:00:00
  Filled 2013-10-07: qty 1

## 2013-10-07 MED ORDER — BISACODYL 5 MG PO TBEC: 5.0000 mg | DELAYED_RELEASE_TABLET | Freq: Every day | ORAL | Status: DC | PRN

## 2013-10-07 MED ORDER — PROPOFOL 10 MG/ML IV BOLUS
INTRAVENOUS | Status: AC
  Filled 2013-10-07: qty 20

## 2013-10-07 MED ORDER — ROPIVACAINE HCL 5 MG/ML IJ SOLN
INTRAMUSCULAR | Status: DC | PRN
  Administered 2013-10-07: 150 mg via PERINEURAL

## 2013-10-07 MED ORDER — MENTHOL 3 MG MT LOZG: 1.0000 | LOZENGE | OROMUCOSAL | Status: DC | PRN

## 2013-10-07 MED ORDER — METHOCARBAMOL 500 MG PO TABS
500.0000 mg | ORAL_TABLET | Freq: Four times a day (QID) | ORAL | Status: DC | PRN
  Administered 2013-10-07 – 2013-10-08 (×2): 500 mg via ORAL
  Filled 2013-10-07: qty 1

## 2013-10-07 MED ORDER — METHOCARBAMOL 500 MG PO TABS
ORAL_TABLET | ORAL | Status: AC
  Administered 2013-10-07: 500 mg via ORAL
  Filled 2013-10-07: qty 1

## 2013-10-07 MED ORDER — METOCLOPRAMIDE HCL 10 MG PO TABS: 5.0000 mg | ORAL_TABLET | Freq: Three times a day (TID) | ORAL | Status: DC | PRN

## 2013-10-07 MED ORDER — ALBUTEROL SULFATE HFA 108 (90 BASE) MCG/ACT IN AERS: 1.0000 | INHALATION_SPRAY | Freq: Two times a day (BID) | RESPIRATORY_TRACT | Status: DC | PRN

## 2013-10-07 MED ORDER — DEXAMETHASONE SODIUM PHOSPHATE 10 MG/ML IJ SOLN
INTRAMUSCULAR | Status: DC | PRN
  Administered 2013-10-07: 6 mg

## 2013-10-07 MED ORDER — ALBUTEROL SULFATE (2.5 MG/3ML) 0.083% IN NEBU: 2.5000 mg | INHALATION_SOLUTION | RESPIRATORY_TRACT | Status: DC | PRN

## 2013-10-07 MED ORDER — HYDROMORPHONE HCL PF 1 MG/ML IJ SOLN
INTRAMUSCULAR | Status: AC
  Administered 2013-10-07: 0.5 mg via INTRAVENOUS
  Filled 2013-10-07: qty 1

## 2013-10-07 MED ORDER — ALUM & MAG HYDROXIDE-SIMETH 200-200-20 MG/5ML PO SUSP: 30.0000 mL | ORAL | Status: DC | PRN

## 2013-10-07 MED ORDER — ATORVASTATIN CALCIUM 80 MG PO TABS
80.0000 mg | ORAL_TABLET | Freq: Every day | ORAL | Status: DC
  Administered 2013-10-07: 80 mg via ORAL
  Filled 2013-10-07 (×2): qty 1

## 2013-10-07 MED ORDER — OXYCODONE HCL ER 10 MG PO T12A
10.0000 mg | EXTENDED_RELEASE_TABLET | Freq: Two times a day (BID) | ORAL | Status: DC
  Administered 2013-10-07 – 2013-10-08 (×3): 10 mg via ORAL
  Filled 2013-10-07 (×3): qty 1

## 2013-10-07 MED ORDER — VANCOMYCIN HCL IN DEXTROSE 1-5 GM/200ML-% IV SOLN
1000.0000 mg | Freq: Two times a day (BID) | INTRAVENOUS | Status: AC
  Administered 2013-10-07: 1000 mg via INTRAVENOUS
  Filled 2013-10-07: qty 200

## 2013-10-07 MED ORDER — LIDOCAINE HCL (CARDIAC) 20 MG/ML IV SOLN: INTRAVENOUS | Status: DC | PRN

## 2013-10-07 MED ORDER — SUCCINYLCHOLINE CHLORIDE 20 MG/ML IJ SOLN
INTRAMUSCULAR | Status: AC
Start: 2013-10-07 — End: 2013-10-07
  Filled 2013-10-07: qty 1

## 2013-10-07 MED ORDER — ONDANSETRON HCL 4 MG/2ML IJ SOLN: 4.0000 mg | Freq: Four times a day (QID) | INTRAMUSCULAR | Status: DC | PRN

## 2013-10-07 MED ORDER — SODIUM CHLORIDE 0.9 % IV SOLN: INTRAVENOUS | Status: DC

## 2013-10-07 MED ORDER — MIDAZOLAM HCL 2 MG/2ML IJ SOLN
INTRAMUSCULAR | Status: AC
  Filled 2013-10-07: qty 2

## 2013-10-07 MED ORDER — PROPOFOL 10 MG/ML IV BOLUS
INTRAVENOUS | Status: DC | PRN
  Administered 2013-10-07: 200 mg via INTRAVENOUS

## 2013-10-07 MED ORDER — OXYCODONE HCL 5 MG/5ML PO SOLN: 5.0000 mg | Freq: Once | ORAL | Status: DC | PRN

## 2013-10-07 MED ORDER — MIDAZOLAM HCL 5 MG/5ML IJ SOLN
INTRAMUSCULAR | Status: DC | PRN
  Administered 2013-10-07: 2 mg via INTRAVENOUS

## 2013-10-07 MED ORDER — BUPIVACAINE LIPOSOME 1.3 % IJ SUSP
INTRAMUSCULAR | Status: DC | PRN
  Administered 2013-10-07: 20 mL

## 2013-10-07 MED ORDER — FENTANYL CITRATE 0.05 MG/ML IJ SOLN
INTRAMUSCULAR | Status: DC | PRN
  Administered 2013-10-07: 25 ug via INTRAVENOUS
  Administered 2013-10-07: 50 ug via INTRAVENOUS
  Administered 2013-10-07: 25 ug via INTRAVENOUS
  Administered 2013-10-07: 50 ug via INTRAVENOUS
  Administered 2013-10-07 (×2): 25 ug via INTRAVENOUS
  Administered 2013-10-07: 50 ug via INTRAVENOUS

## 2013-10-07 MED ORDER — ONDANSETRON HCL 4 MG/2ML IJ SOLN
INTRAMUSCULAR | Status: AC
  Filled 2013-10-07: qty 2

## 2013-10-07 MED ORDER — METOPROLOL TARTRATE 25 MG PO TABS
25.0000 mg | ORAL_TABLET | Freq: Two times a day (BID) | ORAL | Status: DC
  Administered 2013-10-07 – 2013-10-08 (×2): 25 mg via ORAL
  Filled 2013-10-07 (×3): qty 1

## 2013-10-07 MED ORDER — OXYCODONE HCL 5 MG PO TABS
5.0000 mg | ORAL_TABLET | ORAL | Status: DC | PRN
  Administered 2013-10-07 – 2013-10-08 (×5): 10 mg via ORAL
  Filled 2013-10-07 (×4): qty 2

## 2013-10-07 MED ORDER — OXYCODONE HCL 5 MG PO TABS
ORAL_TABLET | ORAL | Status: AC
  Administered 2013-10-07: 10 mg via ORAL
  Filled 2013-10-07: qty 2

## 2013-10-07 MED ORDER — PHENOL 1.4 % MT LIQD: 1.0000 | OROMUCOSAL | Status: DC | PRN

## 2013-10-07 MED ORDER — NITROGLYCERIN 0.4 MG SL SUBL: 0.4000 mg | SUBLINGUAL_TABLET | SUBLINGUAL | Status: DC | PRN

## 2013-10-07 MED ORDER — LIDOCAINE HCL (CARDIAC) 20 MG/ML IV SOLN
INTRAVENOUS | Status: AC
  Filled 2013-10-07: qty 5

## 2013-10-07 MED ORDER — ONDANSETRON HCL 4 MG PO TABS: 4.0000 mg | ORAL_TABLET | Freq: Four times a day (QID) | ORAL | Status: DC | PRN

## 2013-10-07 MED ORDER — OXYCODONE HCL 5 MG PO TABS: 5.0000 mg | ORAL_TABLET | Freq: Once | ORAL | Status: DC | PRN

## 2013-10-07 MED ORDER — DIPHENHYDRAMINE HCL 12.5 MG/5ML PO ELIX: 12.5000 mg | ORAL_SOLUTION | ORAL | Status: DC | PRN

## 2013-10-07 MED ORDER — ROCURONIUM BROMIDE 50 MG/5ML IV SOLN
INTRAVENOUS | Status: AC
  Filled 2013-10-07: qty 1

## 2013-10-07 MED ORDER — SODIUM CHLORIDE 0.9 % IJ SOLN
INTRAMUSCULAR | Status: AC
Start: 2013-10-07 — End: 2013-10-07
  Filled 2013-10-07: qty 10

## 2013-10-07 MED ORDER — SODIUM CHLORIDE 0.9 % IR SOLN
Status: DC | PRN
  Administered 2013-10-07: 1000 mL

## 2013-10-07 MED ORDER — ENOXAPARIN SODIUM 30 MG/0.3ML ~~LOC~~ SOLN
30.0000 mg | Freq: Two times a day (BID) | SUBCUTANEOUS | Status: DC
  Administered 2013-10-08: 30 mg via SUBCUTANEOUS
  Filled 2013-10-07 (×3): qty 0.3

## 2013-10-07 MED ORDER — EPHEDRINE SULFATE 50 MG/ML IJ SOLN
INTRAMUSCULAR | Status: AC
  Filled 2013-10-07: qty 1

## 2013-10-07 MED ORDER — BUPIVACAINE-EPINEPHRINE 0.5% -1:200000 IJ SOLN
INTRAMUSCULAR | Status: DC | PRN
  Administered 2013-10-07: 30 mL

## 2013-10-07 MED ORDER — EPHEDRINE SULFATE 50 MG/ML IJ SOLN
INTRAMUSCULAR | Status: DC | PRN
  Administered 2013-10-07: 5 mg via INTRAVENOUS

## 2013-10-07 MED ORDER — BUPIVACAINE-EPINEPHRINE (PF) 0.5% -1:200000 IJ SOLN
INTRAMUSCULAR | Status: AC
  Filled 2013-10-07: qty 10

## 2013-10-07 MED ORDER — ACETAMINOPHEN 325 MG PO TABS: 650.0000 mg | ORAL_TABLET | Freq: Four times a day (QID) | ORAL | Status: DC | PRN

## 2013-10-07 MED ORDER — CHLORHEXIDINE GLUCONATE 4 % EX LIQD
60.0000 mL | Freq: Once | CUTANEOUS | Status: DC
  Filled 2013-10-07: qty 60

## 2013-10-07 MED ORDER — ARTIFICIAL TEARS OP OINT
TOPICAL_OINTMENT | OPHTHALMIC | Status: AC
  Filled 2013-10-07: qty 3.5

## 2013-10-07 MED ORDER — SENNOSIDES-DOCUSATE SODIUM 8.6-50 MG PO TABS: 1.0000 | ORAL_TABLET | Freq: Every evening | ORAL | Status: DC | PRN

## 2013-10-07 MED ORDER — METOCLOPRAMIDE HCL 5 MG/ML IJ SOLN: 5.0000 mg | Freq: Three times a day (TID) | INTRAMUSCULAR | Status: DC | PRN

## 2013-10-07 MED ORDER — LACTATED RINGERS IV SOLN
INTRAVENOUS | Status: DC | PRN
  Administered 2013-10-07 (×2): via INTRAVENOUS

## 2013-10-07 MED ORDER — ONDANSETRON HCL 4 MG/2ML IJ SOLN
INTRAMUSCULAR | Status: DC | PRN
  Administered 2013-10-07: 4 mg via INTRAVENOUS

## 2013-10-07 MED ORDER — LISINOPRIL 5 MG PO TABS
5.0000 mg | ORAL_TABLET | Freq: Every day | ORAL | Status: DC
  Administered 2013-10-08: 5 mg via ORAL
  Filled 2013-10-07 (×2): qty 1

## 2013-10-07 MED ORDER — CHLORHEXIDINE GLUCONATE 4 % EX LIQD
60.0000 mL | Freq: Once | CUTANEOUS | Status: DC
Start: 2013-10-07 — End: 2013-10-07
  Filled 2013-10-07: qty 60

## 2013-10-07 MED ORDER — HYDROMORPHONE HCL PF 1 MG/ML IJ SOLN
0.2500 mg | INTRAMUSCULAR | Status: DC | PRN
  Administered 2013-10-07 (×4): 0.5 mg via INTRAVENOUS

## 2013-10-07 MED ORDER — METHOCARBAMOL 100 MG/ML IJ SOLN
500.0000 mg | Freq: Four times a day (QID) | INTRAVENOUS | Status: DC | PRN
  Filled 2013-10-07: qty 5

## 2013-10-07 MED ORDER — DOCUSATE SODIUM 100 MG PO CAPS
100.0000 mg | ORAL_CAPSULE | Freq: Two times a day (BID) | ORAL | Status: DC
  Administered 2013-10-07 – 2013-10-08 (×3): 100 mg via ORAL
  Filled 2013-10-07 (×4): qty 1

## 2013-10-07 MED ORDER — PROMETHAZINE HCL 25 MG/ML IJ SOLN: 6.2500 mg | INTRAMUSCULAR | Status: DC | PRN

## 2013-10-07 MED ORDER — HYDROMORPHONE HCL PF 1 MG/ML IJ SOLN
1.0000 mg | INTRAMUSCULAR | Status: DC | PRN
  Administered 2013-10-07: 1 mg via INTRAVENOUS
  Filled 2013-10-07: qty 1

## 2013-10-07 SURGICAL SUPPLY — 56 items
BANDAGE ESMARK 6X9 LF (GAUZE/BANDAGES/DRESSINGS) ×1 IMPLANT
BLADE SAGITTAL 13X1.27X60 (BLADE) ×2 IMPLANT
BLADE SAGITTAL 13X1.27X60MM (BLADE) ×1
BLADE SAW SGTL 83.5X18.5 (BLADE) ×3 IMPLANT
BNDG ESMARK 6X9 LF (GAUZE/BANDAGES/DRESSINGS) ×3
BOWL SMART MIX CTS (DISPOSABLE) ×3 IMPLANT
CAP POR TM CP VIT E LN CER HD ×3 IMPLANT
CEMENT BONE SIMPLEX SPEEDSET (Cement) ×6 IMPLANT
COVER SURGICAL LIGHT HANDLE (MISCELLANEOUS) ×3 IMPLANT
CUFF TOURNIQUET SINGLE 34IN LL (TOURNIQUET CUFF) ×3 IMPLANT
DRAPE EXTREMITY T 121X128X90 (DRAPE) ×3 IMPLANT
DRAPE INCISE IOBAN 66X45 STRL (DRAPES) ×6 IMPLANT
DRAPE PROXIMA HALF (DRAPES) ×3 IMPLANT
DRAPE U-SHAPE 47X51 STRL (DRAPES) ×3 IMPLANT
DRSG ADAPTIC 3X8 NADH LF (GAUZE/BANDAGES/DRESSINGS) ×3 IMPLANT
DRSG PAD ABDOMINAL 8X10 ST (GAUZE/BANDAGES/DRESSINGS) ×3 IMPLANT
DURAPREP 26ML APPLICATOR (WOUND CARE) ×6 IMPLANT
ELECT REM PT RETURN 9FT ADLT (ELECTROSURGICAL) ×3
ELECTRODE REM PT RTRN 9FT ADLT (ELECTROSURGICAL) ×1 IMPLANT
EVACUATOR 1/8 PVC DRAIN (DRAIN) ×3 IMPLANT
GLOVE BIOGEL M 7.0 STRL (GLOVE) ×3 IMPLANT
GLOVE BIOGEL PI IND STRL 7.5 (GLOVE) IMPLANT
GLOVE BIOGEL PI IND STRL 8.5 (GLOVE) ×2 IMPLANT
GLOVE BIOGEL PI INDICATOR 7.5 (GLOVE)
GLOVE BIOGEL PI INDICATOR 8.5 (GLOVE) ×4
GLOVE SURG ORTHO 8.0 STRL STRW (GLOVE) ×6 IMPLANT
GOWN STRL REUS W/ TWL LRG LVL3 (GOWN DISPOSABLE) ×2 IMPLANT
GOWN STRL REUS W/ TWL XL LVL3 (GOWN DISPOSABLE) ×2 IMPLANT
GOWN STRL REUS W/TWL LRG LVL3 (GOWN DISPOSABLE) ×4
GOWN STRL REUS W/TWL XL LVL3 (GOWN DISPOSABLE) ×4
HANDPIECE INTERPULSE COAX TIP (DISPOSABLE) ×2
HOOD PEEL AWAY FACE SHEILD DIS (HOOD) ×12 IMPLANT
KIT BASIN OR (CUSTOM PROCEDURE TRAY) ×3 IMPLANT
KIT ROOM TURNOVER OR (KITS) ×3 IMPLANT
MANIFOLD NEPTUNE II (INSTRUMENTS) ×3 IMPLANT
NEEDLE 22X1 1/2 (OR ONLY) (NEEDLE) ×6 IMPLANT
NS IRRIG 1000ML POUR BTL (IV SOLUTION) ×3 IMPLANT
PACK TOTAL JOINT (CUSTOM PROCEDURE TRAY) ×3 IMPLANT
PAD ABD 8X10 STRL (GAUZE/BANDAGES/DRESSINGS) ×3 IMPLANT
PAD ARMBOARD 7.5X6 YLW CONV (MISCELLANEOUS) ×6 IMPLANT
PADDING CAST COTTON 6X4 STRL (CAST SUPPLIES) ×3 IMPLANT
SET HNDPC FAN SPRY TIP SCT (DISPOSABLE) ×1 IMPLANT
SPONGE GAUZE 4X4 12PLY (GAUZE/BANDAGES/DRESSINGS) ×3 IMPLANT
STAPLER VISISTAT 35W (STAPLE) ×3 IMPLANT
SUCTION FRAZIER TIP 10 FR DISP (SUCTIONS) ×3 IMPLANT
SUT BONE WAX W31G (SUTURE) ×3 IMPLANT
SUT VIC AB 0 CTB1 27 (SUTURE) ×6 IMPLANT
SUT VIC AB 1 CT1 27 (SUTURE) ×4
SUT VIC AB 1 CT1 27XBRD ANBCTR (SUTURE) ×2 IMPLANT
SUT VIC AB 2-0 CT1 27 (SUTURE) ×4
SUT VIC AB 2-0 CT1 TAPERPNT 27 (SUTURE) ×2 IMPLANT
SYR CONTROL 10ML LL (SYRINGE) ×6 IMPLANT
TOWEL OR 17X24 6PK STRL BLUE (TOWEL DISPOSABLE) ×3 IMPLANT
TOWEL OR 17X26 10 PK STRL BLUE (TOWEL DISPOSABLE) ×3 IMPLANT
TRAY FOLEY CATH 14FR (SET/KITS/TRAYS/PACK) IMPLANT
WATER STERILE IRR 1000ML POUR (IV SOLUTION) IMPLANT

## 2013-10-07 NOTE — Anesthesia Preprocedure Evaluation (Addendum)
Anesthesia Evaluation  Patient identified by MRN, date of birth, ID band Patient awake    Reviewed: Allergy & Precautions, H&P , NPO status , Patient's Chart, lab work & pertinent test results  History of Anesthesia Complications Negative for: history of anesthetic complications  Airway Mallampati: II TM Distance: >3 FB Neck ROM: Full    Dental  (+) Dental Advisory Given, Teeth Intact   Pulmonary asthma ,    Pulmonary exam normal       Cardiovascular hypertension, Pt. on home beta blockers + CAD, + Past MI and + Cardiac Stents  Rest stress myoview - 11/15/11  Abnormal stress nuclear study with small, moderate intensity, fixed inferobasal and apical defects consistent with prior inferobasal infarct and apical thinning; no ischemia.  LV Ejection Fraction: 50%.  LV Wall Motion:  Inferobasal hypokinesis.    Neuro/Psych negative neurological ROS  negative psych ROS   GI/Hepatic negative GI ROS, Neg liver ROS,   Endo/Other  negative endocrine ROS  Renal/GU negative Renal ROS     Musculoskeletal   Abdominal   Peds  Hematology negative hematology ROS (+)   Anesthesia Other Findings   Reproductive/Obstetrics                       Anesthesia Physical Anesthesia Plan  ASA: III  Anesthesia Plan: General and Regional   Post-op Pain Management:    Induction: Intravenous  Airway Management Planned: LMA  Additional Equipment:   Intra-op Plan:   Post-operative Plan: Extubation in OR  Informed Consent: I have reviewed the patients History and Physical, chart, labs and discussed the procedure including the risks, benefits and alternatives for the proposed anesthesia with the patient or authorized representative who has indicated his/her understanding and acceptance.   Dental advisory given  Plan Discussed with: Anesthesiologist, Surgeon and CRNA  Anesthesia Plan Comments:       Anesthesia  Quick Evaluation

## 2013-10-07 NOTE — Progress Notes (Signed)
Dr Tonny BollmanMichael Cooper is cardiologist.  Last seen in November.

## 2013-10-07 NOTE — Preoperative (Signed)
Beta Blockers   Reason not to administer Beta Blockers:Not Applicable, pt took lopressor 10/07/13

## 2013-10-07 NOTE — H&P (Signed)
Mario Underwood MRN:  [id number] DOB/SEX:  11/25/1960/male  CHIEF COMPLAINT:  Painful right Knee  HISTORY: Patient is a 53 y.o. male presented with a history of pain in the right knee. Onset of symptoms was gradual starting several years ago with gradually worsening course since that time. Prior procedures on the knee include none. Patient has been treated conservatively with over-the-counter NSAIDs and activity modification. Patient currently rates pain in the knee at 8 out of 10 with activity. There is no pain at night.  PAST MEDICAL HISTORY: Patient Active Problem List   Diagnosis Date Noted  . History of acute inferior wall MI 01/06/2011  . CAD (coronary artery disease)   . Hypertension   . Hyperlipemia   . Osteoarthritis    Past Medical History  Diagnosis Date  . CAD (coronary artery disease)     A.  Inferior STEMI 12/12/10, treated with a Xience DES;  B.  Staged PCI of the LAD with a Promus DES;  C.  EF 55%, inferior AK  . Hypertension   . Hyperlipemia   . Osteoarthritis     Right knee TKR pending with Dr. Charlann Boxerlin  . Myocardial infarction 2012    2 stents placed  . Asthma     once.week   Past Surgical History  Procedure Laterality Date  . Coronary stent placement  2012  . Hernia repair      left inguinal  . Arthroscopic repair acl Right   . Urology procedure       MEDICATIONS:   Prescriptions prior to admission  Medication Sig Dispense Refill  . albuterol (PROVENTIL HFA;VENTOLIN HFA) 108 (90 BASE) MCG/ACT inhaler Inhale 1 puff into the lungs 2 (two) times daily as needed for wheezing.  1 Inhaler  2  . aspirin 81 MG tablet Take 81 mg by mouth daily.        Marland Kitchen. atorvastatin (LIPITOR) 80 MG tablet TAKE 1/2 TABLET EVERY DAY  45 tablet  6  . Glucosamine-Chondroitin-MSM 500-200-150 MG TABS Take 1 tablet by mouth daily.       Marland Kitchen. lisinopril (PRINIVIL,ZESTRIL) 5 MG tablet Take 1 tablet (5 mg total) by mouth daily.  30 tablet  11  . metoprolol tartrate (LOPRESSOR) 25 MG tablet  Take 1 tablet (25 mg total) by mouth 2 (two) times daily.  60 tablet  11  . Multiple Vitamin (DAILY MULTIVITAMIN PO) Take 1 tablet by mouth daily.       . nitroGLYCERIN (NITROSTAT) 0.4 MG SL tablet Place 0.4 mg under the tongue every 5 (five) minutes as needed for chest pain.      . Omega-3 Fatty Acids (FISH OIL) 1000 MG CAPS Take 1,000 mg by mouth daily.         ALLERGIES:   Allergies  Allergen Reactions  . Penicillins Hives, Swelling and Rash    REVIEW OF SYSTEMS:  Pertinent items are noted in HPI.   FAMILY HISTORY:   Family History  Problem Relation Age of Onset  . Coronary artery disease Sister   . Heart disease Mother     SOCIAL HISTORY:   History  Substance Use Topics  . Smoking status: Never Smoker   . Smokeless tobacco: Current User    Types: Chew  . Alcohol Use: Yes     Comment: weekends     EXAMINATION:  Vital signs in last 24 hours: Temp:  [98.1 F (36.7 C)] 98.1 F (36.7 C) (03/16 0541) Pulse Rate:  [64] 64 (03/16 0541) Resp:  [20]  20 (03/16 0541) BP: (144)/(94) 144/94 mmHg (03/16 0541) SpO2:  [99 %] 99 % (03/16 0541)  General appearance: alert, cooperative and no distress Lungs: clear to auscultation bilaterally Heart: regular rate and rhythm, S1, S2 normal, no murmur, click, rub or gallop Abdomen: soft, non-tender; bowel sounds normal; no masses,  no organomegaly Extremities: extremities normal, atraumatic, no cyanosis or edema and Homans sign is negative, no sign of DVT Pulses: 2+ and symmetric Skin: Skin color, texture, turgor normal. No rashes or lesions Neurologic: Alert and oriented X 3, normal strength and tone. Normal symmetric reflexes. Normal coordination and gait  Musculoskeletal:  ROM 0-115, Ligaments intact,  Imaging Review Plain radiographs demonstrate severe degenerative joint disease of the right knee. The overall alignment is mild valgus. The bone quality appears to be good for age and reported activity  level.  Assessment/Plan: End stage arthritis, right knee   The patient history, physical examination and imaging studies are consistent with advanced degenerative joint disease of the right knee. The patient has failed conservative treatment.  The clearance notes were reviewed.  After discussion with the patient it was felt that Total Knee Replacement was indicated. The procedure,  risks, and benefits of total knee arthroplasty were presented and reviewed. The risks including but not limited to aseptic loosening, infection, blood clots, vascular injury, stiffness, patella tracking problems complications among others were discussed. The patient acknowledged the explanation, agreed to proceed with the plan.  Shelisha Gautier 10/07/2013, 6:36 AM

## 2013-10-07 NOTE — Progress Notes (Signed)
Orthopedic Tech Progress Note Patient Details:  Mario Underwood 11/08/1960 [id number]  CPM Right Knee CPM Right Knee: On Right Knee Flexion (Degrees): 90 Right Knee Extension (Degrees): 0 Additional Comments: Trapeze bar and foot roll   Cammer, Mickie BailJennifer Carol 10/07/2013, 10:26 AM

## 2013-10-07 NOTE — Anesthesia Postprocedure Evaluation (Signed)
Anesthesia Post Note  Patient: Mario Underwood  Procedure(s) Performed: Procedure(s) (LRB): RIGHT TOTAL KNEE ARTHROPLASTY (Right)  Anesthesia type: general  Patient location: PACU  Post pain: Pain level controlled  Post assessment: Patient's Cardiovascular Status Stable  Last Vitals:  Filed Vitals:   10/07/13 1015  BP: 128/94  Pulse: 77  Temp: 36.6 C  Resp: 19    Post vital signs: Reviewed and stable  Level of consciousness: sedated  Complications: No apparent anesthesia complications

## 2013-10-07 NOTE — Transfer of Care (Signed)
Immediate Anesthesia Transfer of Care Note  Patient: Mario Underwood  Procedure(s) Performed: Procedure(s): RIGHT TOTAL KNEE ARTHROPLASTY (Right)  Patient Location: PACU  Anesthesia Type:General and regional  Level of Consciousness: awake, alert  and oriented  Airway & Oxygen Therapy: Patient Spontanous Breathing and Patient connected to nasal cannula oxygen  Post-op Assessment: Report given to PACU RN and Post -op Vital signs reviewed and stable  Post vital signs: Reviewed and stable  Complications: No apparent anesthesia complications

## 2013-10-07 NOTE — Evaluation (Signed)
Physical Therapy Evaluation Patient Details Name: Mario Underwood MRN: [id number] DOB: 05/07/1961 Today's Date: 10/07/2013 Time: 1425-1500 PT Time Calculation (min): 35 min  PT Assessment / Plan / Recommendation History of Present Illness  Pt is a 53 y/o male admitted s/p R TKA on 10/07/2013.  Clinical Impression  This patient presents with acute pain and decreased functional independence following the above mentioned procedure. At the time of PT eval, pt was able to perform transfers and ambulation with min guard. RN notified of significant amount of leakage from hemovac drain at insertion site, and she redressed the area. This patient is appropriate for skilled PT interventions to address functional limitations, improve safety and independence with functional mobility, and return to PLOF.    PT Assessment  Patient needs continued PT services    Follow Up Recommendations  Home health PT;Supervision - Intermittent    Does the patient have the potential to tolerate intense rehabilitation      Barriers to Discharge        Equipment Recommendations  None recommended by PT    Recommendations for Other Services     Frequency 7X/week    Precautions / Restrictions Precautions Precautions: Fall;Knee Precaution Comments: Discussed foam roll under heel and NO pillow under knee.  Restrictions Weight Bearing Restrictions: Yes RLE Weight Bearing: Weight bearing as tolerated   Pertinent Vitals/Pain Pt reports 5/10 pain at rest, and states he has increased pain during ambulation but did not rate on 0-10 scale.       Mobility  Bed Mobility Overal bed mobility: Needs Assistance Bed Mobility: Sit to Supine Sit to supine: Min guard General bed mobility comments: VC's for sequencing and technique. No physical assist required to elevate LE's into bed. Transfers Overall transfer level: Needs assistance Equipment used: Rolling walker (2 wheeled) Transfers: Sit to/from Stand Sit to Stand:  Min guard General transfer comment: VC's for hand placement on seated surface for safety.  Ambulation/Gait Ambulation/Gait assistance: Min guard Ambulation Distance (Feet): 75 Feet Assistive device: Rolling walker (2 wheeled) Gait Pattern/deviations: Step-to pattern;Step-through pattern;Decreased stride length Gait velocity: Decreased Gait velocity interpretation: Below normal speed for age/gender General Gait Details: VC's for sequencing and safety awareness with the RW. Pt specifically cued for increased heel strike, walker fluidity, and encouraged step-through gait pattern.     Exercises Total Joint Exercises Ankle Circles/Pumps: 10 reps Quad Sets: 10 reps   PT Diagnosis: Difficulty walking;Acute pain  PT Problem List: Decreased strength;Decreased range of motion;Decreased activity tolerance;Decreased balance;Decreased mobility;Decreased knowledge of use of DME;Decreased safety awareness;Decreased knowledge of precautions;Pain PT Treatment Interventions: DME instruction;Gait training;Stair training;Functional mobility training;Therapeutic activities;Therapeutic exercise;Patient/family education;Neuromuscular re-education     PT Goals(Current goals can be found in the care plan section) Acute Rehab PT Goals Patient Stated Goal: To return to work in 4-5 weeks PT Goal Formulation: With patient/family Time For Goal Achievement: 10/14/13 Potential to Achieve Goals: Good  Visit Information  Last PT Received On: 10/07/13 Assistance Needed: +1 History of Present Illness: Pt is a 53 y/o male admitted s/p R TKA on 10/07/2013.       Prior Functioning  Home Living Family/patient expects to be discharged to:: Private residence Living Arrangements: Spouse/significant other Available Help at Discharge: Family;Available 24 hours/day Type of Home: House Home Access: Stairs to enter Entergy CorporationEntrance Stairs-Number of Steps: 3 Entrance Stairs-Rails: None Home Layout: One level Home Equipment:  Walker - 2 wheels;Bedside commode Prior Function Level of Independence: Independent Communication Communication: No difficulties Dominant Hand: Right    Cognition  Cognition Arousal/Alertness: Awake/alert Behavior During Therapy: WFL for tasks assessed/performed Overall Cognitive Status: Within Functional Limits for tasks assessed    Extremity/Trunk Assessment Upper Extremity Assessment Upper Extremity Assessment: Defer to OT evaluation Lower Extremity Assessment Lower Extremity Assessment: RLE deficits/detail RLE Deficits / Details: Decreased strength and AROM consistent with TKA RLE: Unable to fully assess due to pain Cervical / Trunk Assessment Cervical / Trunk Assessment: Normal   Balance Balance Overall balance assessment: No apparent balance deficits (not formally assessed)  End of Session PT - End of Session Equipment Utilized During Treatment: Gait belt Activity Tolerance: Patient tolerated treatment well Patient left: in bed;with call bell/phone within reach;with family/visitor present Nurse Communication: Mobility status CPM Right Knee CPM Right Knee: Off  GP     Ruthann Cancer 10/07/2013, 3:15 PM  Ruthann Cancer, PT, DPT Acute Rehabilitation Services Pager: [phone]

## 2013-10-07 NOTE — Anesthesia Procedure Notes (Addendum)
Anesthesia Regional Block:  Adductor canal block  Pre-Anesthetic Checklist: ,, timeout performed, Correct Patient, Correct Site, Correct Laterality, Correct Procedure, Correct Position, site marked, Risks and benefits discussed,  Surgical consent,  Pre-op evaluation,  At surgeon's request and post-op pain management  Laterality: Right  Prep: chloraprep       Needles:  Injection technique: Single-shot  Needle Type: Echogenic Stimulator Needle     Needle Length: 10cm 10 cm Needle Gauge: 21 and 21 G    Additional Needles:  Procedures: ultrasound guided (picture in chart) Adductor canal block Narrative:  Start time: 10/07/2013 7:06 AM End time: 10/07/2013 7:16 AM Injection made incrementally with aspirations every 5 mL.  Performed by: Personally  Anesthesiologist: Halford DecampJ. Daniel Singer, MD  Additional Notes:      Procedure Name: LMA Insertion Date/Time: 10/07/2013 7:28 AM Performed by: Elon AlasLEE, [place] Millon BROWN Pre-anesthesia Checklist: Patient identified, Timeout performed, Emergency Drugs available, Suction available and Patient being monitored Patient Re-evaluated:Patient Re-evaluated prior to inductionOxygen Delivery Method: Circle system utilized Preoxygenation: Pre-oxygenation with 100% oxygen Intubation Type: IV induction Ventilation: Mask ventilation without difficulty LMA: LMA inserted LMA Size: 5.0 Number of attempts: 1

## 2013-10-07 NOTE — Progress Notes (Signed)
Utilization review completed.  

## 2013-10-07 NOTE — Evaluation (Signed)
Occupational Therapy Evaluation Patient Details Name: Sherie DonKenneth R Ambrosini MRN: [id number] DOB: 01/31/1961 Today's Date: 10/07/2013 Time: [id number] OT Time Calculation (min): 26 min  OT Assessment / Plan / Recommendation History of present illness Pt is a 53 y/o male admitted s/p R TKA on 10/07/2013.   Clinical Impression   Pt moving well during session. Education provided to pt and spouse. No further OT needs at this time.    OT Assessment  Patient does not need any further OT services    Follow Up Recommendations  No OT follow up;Supervision - Intermittent    Barriers to Discharge      Equipment Recommendations  None recommended by OT    Recommendations for Other Services    Frequency       Precautions / Restrictions Precautions Precautions: Fall;Knee Precaution Comments: Reviewed precautions with pt Restrictions Weight Bearing Restrictions: Yes RLE Weight Bearing: Weight bearing as tolerated   Pertinent Vitals/Pain Pain 3/10. Repositioned. Increased activity during session.     ADL  Grooming: Wash/dry hands;Supervision/safety Where Assessed - Grooming: Supported standing Upper Body Dressing: Set up Where Assessed - Upper Body Dressing: Supported sitting Lower Body Dressing: Min guard Where Assessed - Lower Body Dressing: Supported sit to Pharmacist, hospitalstand Toilet Transfer: Personnel officerMin guard;Supervision/safety Toilet Transfer Method: Sit to Baristastand Toilet Transfer Equipment: Comfort height toilet (from chair/bed; stood at toilet to ConAgra Foodsurinate-min guard) Toileting - ArchitectClothing Manipulation and Hygiene: Min guard Where Assessed - Engineer, miningToileting Clothing Manipulation and Hygiene: Standing Tub/Shower Transfer: Insurance risk surveyorMin guard Tub/Shower Transfer Method: Science writerAmbulating Tub/Shower Transfer Equipment: Shower seat with back Equipment Used: Gait belt;Rolling walker Transfers/Ambulation Related to ADLs: Min guard for ambulation. Min guard/Supervision for transfers. ADL Comments: Educated on benefit of reaching down to  don/doff right sock as it increases ROM in knee. Pt donned underwear-educated on dressing technique. Recommended sitting for dressing and sitting on chair for bathing. Recommended spouse be with him for tub transfer. Education on different techniques and did not recommend stepping over a tub right now. Discussed use of bag on walker, safe shoewear, and having wife pick up rugs in house.  Explained purpose of footsie roll. Told pt that AE is available for LB ADLs if he does have less ROM later on in knee.   OT Diagnosis:    OT Problem List:   OT Treatment Interventions:     OT Goals(Current goals can be found in the care plan section) Acute Rehab OT Goals Patient Stated Goal: To return to work in 4-5 weeks  Visit Information  Last OT Received On: 10/07/13 Assistance Needed: +1 History of Present Illness: Pt is a 53 y/o male admitted s/p R TKA on 10/07/2013.       Prior Functioning     Home Living Family/patient expects to be discharged to:: Private residence Living Arrangements: Spouse/significant other Available Help at Discharge: Family;Available 24 hours/day Type of Home: House Home Access: Stairs to enter Entergy CorporationEntrance Stairs-Number of Steps: 3 Entrance Stairs-Rails: None Home Layout: One level Home Equipment: Walker - 2 wheels;Bedside commode Prior Function Level of Independence: Independent Communication Communication: No difficulties Dominant Hand: Right         Vision/Perception     Cognition  Cognition Arousal/Alertness: Awake/alert Behavior During Therapy: WFL for tasks assessed/performed Overall Cognitive Status: Within Functional Limits for tasks assessed    Extremity/Trunk Assessment Upper Extremity Assessment Upper Extremity Assessment: Overall WFL for tasks assessed Lower Extremity Assessment Lower Extremity Assessment: Defer to PT evaluation RLE Deficits / Details: Decreased strength and AROM consistent with TKA RLE:  Unable to fully assess due to  pain Cervical / Trunk Assessment Cervical / Trunk Assessment: Normal     Mobility Bed Mobility Overal bed mobility: Needs Assistance Bed Mobility: Sit to Supine;Supine to Sit Supine to sit: Supervision Sit to supine: Supervision Transfers Overall transfer level: Needs assistance Equipment used: Rolling walker (2 wheeled) Transfers: Sit to/from Stand Sit to Stand: Min guard;Supervision General transfer comment: Cues for technique.        Balance Balance Overall balance assessment: No apparent balance deficits (not formally assessed)   End of Session OT - End of Session Equipment Utilized During Treatment: Gait belt;Rolling walker Activity Tolerance: Patient tolerated treatment well Patient left: in bed;with call bell/phone within reach;with family/visitor present Nurse Communication: Mobility status CPM Right Knee CPM Right Knee: Off  GO     Earlie Raveling OTR/L [phone] 10/07/2013, 5:41 PM

## 2013-10-07 NOTE — Care Management Note (Signed)
CARE MANAGEMENT NOTE 10/07/2013  Patient:  Mario Underwood,Mario Underwood   Account Number:  [id number]  Date Initiated:  10/07/2013  Documentation initiated by:  Vance PeperBRADY,Jaiyden Laur  Subjective/Objective Assessment:   53 yr old male s/p right total knee arthroplasty.     Action/Plan:   Case manager spoke with patient and wife concerning home health and DME needs at discharge. Preoperatively setup with [facility], no changes. Dme has been delivered to the home. Patient has family support at discharge.   Anticipated DC Date:  10/08/2013   Anticipated DC Plan:  HOME W HOME HEALTH SERVICES      DC Planning Services  CM consult      [facility]  [facility]ALTH  DURABLE MEDICAL EQUIPMENT   Choice offered to / List presented to:     DME arranged  CPM  WALKER - ROLLING  3-N-1      DME agency  [employer]     HH arranged  HH-2 PT      Status of service:  In process, will continue to follow

## 2013-10-08 ENCOUNTER — Encounter (HOSPITAL_COMMUNITY): Payer: Self-pay | Admitting: Orthopedic Surgery

## 2013-10-08 LAB — CBC
HCT: 43.4 % (ref 39.0–52.0)
HEMOGLOBIN: 14.6 g/dL (ref 13.0–17.0)
MCH: 31.1 pg (ref 26.0–34.0)
MCHC: 33.6 g/dL (ref 30.0–36.0)
MCV: 92.3 fL (ref 78.0–100.0)
Platelets: 219 10*3/uL (ref 150–400)
RBC: 4.7 MIL/uL (ref 4.22–5.81)
RDW: 13.8 % (ref 11.5–15.5)
WBC: 23.4 10*3/uL — AB (ref 4.0–10.5)

## 2013-10-08 LAB — BASIC METABOLIC PANEL
BUN: 22 mg/dL (ref 6–23)
CO2: 28 meq/L (ref 19–32)
Calcium: 9.4 mg/dL (ref 8.4–10.5)
Chloride: 99 mEq/L (ref 96–112)
Creatinine, Ser: 0.96 mg/dL (ref 0.50–1.35)
GFR calc Af Amer: >90 mL/min (ref >90)
Glucose, Bld: 114 mg/dL — ABNORMAL HIGH (ref 70–99)
Potassium: 5.2 mEq/L (ref 3.7–5.3)
SODIUM: 139 meq/L (ref 137–147)

## 2013-10-08 MED ORDER — OXYCODONE HCL 5 MG PO TABS: 5.0000 mg | ORAL_TABLET | ORAL | Status: DC | PRN

## 2013-10-08 MED ORDER — ENOXAPARIN SODIUM 40 MG/0.4ML ~~LOC~~ SOLN: 40.0000 mg | SUBCUTANEOUS | Status: DC

## 2013-10-08 MED ORDER — METHOCARBAMOL 500 MG PO TABS: 500.0000 mg | ORAL_TABLET | Freq: Four times a day (QID) | ORAL | Status: DC | PRN

## 2013-10-08 MED ORDER — CELECOXIB 200 MG PO CAPS: 200.0000 mg | ORAL_CAPSULE | Freq: Two times a day (BID) | ORAL | Status: DC

## 2013-10-08 NOTE — Progress Notes (Signed)
Physical Therapy Treatment Patient Details Name: Mario Underwood MRN: [id number] DOB: [birth year] Today's Date: 10/08/2013 Time: 4098-1191 PT Time Calculation (min): 48 min  PT Assessment / Plan / Recommendation  History of Present Illness Pt is a 53 y/o male admitted s/p R TKA on 10/07/2013.   PT Comments   Pt progressing well towards physical therapy goals. Pt able to improve ambulation distance with no complaints of increased pain, and negotiated 3 steps with RW to simulate home environment. Pt and wife anticipate d/c home this afternoon.   Follow Up Recommendations  Home health PT;Supervision - Intermittent     Does the patient have the potential to tolerate intense rehabilitation     Barriers to Discharge        Equipment Recommendations  None recommended by PT    Recommendations for Other Services    Frequency 7X/week   Progress towards PT Goals Progress towards PT goals: Progressing toward goals  Plan Current plan remains appropriate    Precautions / Restrictions Precautions Precautions: Fall;Knee Precaution Comments: Discussed foam roll under heel and NO pillow under knee.  Restrictions Weight Bearing Restrictions: Yes RLE Weight Bearing: Weight bearing as tolerated   Pertinent Vitals/Pain Pt reports minimal pain throughout session.     Mobility  Bed Mobility Overal bed mobility: Needs Assistance Bed Mobility: Sit to Supine;Supine to Sit Supine to sit: Supervision General bed mobility comments: VC's for sequencing and technique. No physical assist required to transition to EOB. Transfers Overall transfer level: Needs assistance Equipment used: Rolling walker (2 wheeled) Transfers: Sit to/from Stand Sit to Stand: Supervision General transfer comment: Pt demonstrated proper hand placement and safety awareness.  Ambulation/Gait Ambulation/Gait assistance: Supervision Ambulation Distance (Feet): 400 Feet Assistive device: Rolling walker (2 wheeled) Gait  Pattern/deviations: Step-through pattern;Decreased stride length Gait velocity: Decreased Gait velocity interpretation: Below normal speed for age/gender General Gait Details: VC's for sequencing and safety awareness with the RW. Pt specifically cued for increased heel strike, walker fluidity, and encouraged step-through gait pattern.  Stairs: Yes Stairs assistance: Min guard Stair Management: Backwards;With walker Number of Stairs: 3 General stair comments: VC's for safety and sequencing.     Exercises Total Joint Exercises Ankle Circles/Pumps: 10 reps Quad Sets: 10 reps Heel Slides: 10 reps Hip ABduction/ADduction: 10 reps Straight Leg Raises: 10 reps Long Arc Quad: 15 reps Goniometric ROM: 7-112 AROM   PT Diagnosis:    PT Problem List:   PT Treatment Interventions:     PT Goals (current goals can now be found in the care plan section) Acute Rehab PT Goals Patient Stated Goal: To return to work in 4-5 weeks PT Goal Formulation: With patient/family Time For Goal Achievement: 10/14/13 Potential to Achieve Goals: Good  Visit Information  Last PT Received On: 10/08/13 Assistance Needed: +1 History of Present Illness: Pt is a 53 y/o male admitted s/p R TKA on 10/07/2013.    Subjective Data  Subjective: "I think I'm going home today." Patient Stated Goal: To return to work in 4-5 weeks   Cognition  Cognition Arousal/Alertness: Awake/alert Behavior During Therapy: WFL for tasks assessed/performed Overall Cognitive Status: Within Functional Limits for tasks assessed    Balance  Balance Overall balance assessment: No apparent balance deficits (not formally assessed)  End of Session PT - End of Session Equipment Utilized During Treatment: Gait belt Activity Tolerance: Patient tolerated treatment well Patient left: with family/visitor present;in chair;with call bell/phone within reach Nurse Communication: Mobility status   GP     Ruthann Cancer  10/08/2013, 12:37  PM  Ruthann CancerLaura Underwood, PT, DPT Acute Rehabilitation Services Pager: [phone]

## 2013-10-08 NOTE — Progress Notes (Signed)
Physical Therapy Treatment Patient Details Name: Mario Underwood MRN: [id number] DOB: [birth year] Today's Date: 10/08/2013 Time: 1350-1403 PT Time Calculation (min): 13 min  PT Assessment / Plan / Recommendation  History of Present Illness Pt is a 53 y/o male admitted s/p R TKA on 10/07/2013.   PT Comments   Pt progressing well towards physical therapy goals. Pt cued for safety throughout session, as he is rushing through transfers and trying to stand without walker in front of him. Overall, doing very well functionally and demonstrates a great rehab effort. Pt to d/c this afternoon.   Follow Up Recommendations  Home health PT;Supervision - Intermittent     Does the patient have the potential to tolerate intense rehabilitation     Barriers to Discharge        Equipment Recommendations  None recommended by PT    Recommendations for Other Services    Frequency 7X/week   Progress towards PT Goals Progress towards PT goals: Progressing toward goals  Plan Current plan remains appropriate    Precautions / Restrictions Precautions Precautions: Fall;Knee Precaution Comments: Discussed foam roll under heel and NO pillow under knee.  Restrictions Weight Bearing Restrictions: Yes RLE Weight Bearing: Weight bearing as tolerated   Pertinent Vitals/Pain Increased pain after ambulation. RN notified.     Mobility  Bed Mobility Overal bed mobility: Modified Independent Bed Mobility: Supine to Sit Supine to sit: Supervision General bed mobility comments: Pt able to transition to EOB without assist or use of bed rails for support.  Transfers Overall transfer level: Modified independent Equipment used: Rolling walker (2 wheeled) Transfers: Sit to/from Stand Sit to Stand: Supervision General transfer comment: Pt demonstrated proper hand placement. Reminded of safety awareness.  Ambulation/Gait Ambulation/Gait assistance: Supervision Ambulation Distance (Feet): 500 Feet Assistive device:  Rolling walker (2 wheeled) Gait Pattern/deviations: Step-through pattern;Decreased stride length Gait velocity: Decreased Gait velocity interpretation: Below normal speed for age/gender General Gait Details: Encouraged pt to take it slow starting out as he was trying to rush through the steps. Progressed to a steady cadence.  Stairs: Yes Stairs assistance: Min guard Stair Management: Backwards;With walker Number of Stairs: 3 General stair comments: VC's for safety and sequencing.     Exercises Total Joint Exercises Ankle Circles/Pumps: 10 reps Quad Sets: 10 reps Heel Slides: 10 reps Hip ABduction/ADduction: 10 reps Straight Leg Raises: 10 reps Long Arc Quad: 15 reps Goniometric ROM: 7-112 AROM   PT Diagnosis:    PT Problem List:   PT Treatment Interventions:     PT Goals (current goals can now be found in the care plan section) Acute Rehab PT Goals Patient Stated Goal: To return to work in 4-5 weeks PT Goal Formulation: With patient/family Time For Goal Achievement: 10/14/13 Potential to Achieve Goals: Good  Visit Information  Last PT Received On: 10/08/13 Assistance Needed: +1 History of Present Illness: Pt is a 53 y/o male admitted s/p R TKA on 10/07/2013.    Subjective Data  Subjective: "It's gotten really stiff." Patient Stated Goal: To return to work in 4-5 weeks   Cognition  Cognition Arousal/Alertness: Awake/alert Behavior During Therapy: WFL for tasks assessed/performed Overall Cognitive Status: Within Functional Limits for tasks assessed    Balance  Balance Overall balance assessment: No apparent balance deficits (not formally assessed) General Comments General comments (skin integrity, edema, etc.): Discussed car transfer with pt  End of Session PT - End of Session Equipment Utilized During Treatment: Gait belt Activity Tolerance: Patient tolerated treatment well Patient left: in  chair;with call bell/phone within reach;with family/visitor present;with  nursing/sitter in room Nurse Communication: Mobility status CPM Right Knee CPM Right Knee: On   GP     Ruthann CancerHamilton, Mario Bonura 10/08/2013, 2:18 PM  Ruthann CancerLaura Underwood, PT, DPT Acute Rehabilitation Services Pager: [phone]

## 2013-10-08 NOTE — Plan of Care (Signed)
Problem: Discharge Progression Outcomes Goal: Anticoagulant follow-up in place Outcome: Completed/Met Date Met:  10/08/13 Wife instructed on giving lovenox inj     

## 2013-10-08 NOTE — Care Management Note (Signed)
CARE MANAGEMENT NOTE 10/08/2013  Patient:  Mario Underwood,Mario Underwood   Account Number:  [id number]  Date Initiated:  10/07/2013  Documentation initiated by:  Vance PeperBRADY,Gwyn Hieronymus  Subjective/Objective Assessment:   53 yr old male s/p right total knee arthroplasty.     Action/Plan:   Case manager spoke with patient and wife concerning home health and DME needs at discharge. Preoperatively setup with [facility], no changes. Dme has been delivered to the home. Patient has family support at discharge.   Anticipated DC Date:  10/08/2013   Anticipated DC Plan:  HOME W HOME HEALTH SERVICES      DC Planning Services  CM consult      [facility]  [facility]  DURABLE MEDICAL EQUIPMENT   Choice offered to / List presented to:     DME arranged  CPM  WALKER - ROLLING  3-N-1      DME agency  [employer]     HH arranged  HH-2 PT      Status of service:  Completed, signed off Medicare Important Message given?   (If response is "NO", the following Medicare IM given date fields will be blank) Date Medicare IM given:   Date Additional Medicare IM given:    Discharge Disposition:  HOME W HOME HEALTH SERVICES  Per UR Regulation:    If discussed at Long Length of Stay Meetings, dates discussed:    Comments:

## 2013-10-08 NOTE — Op Note (Signed)
TOTAL KNEE REPLACEMENT OPERATIVE NOTE:  10/07/2013  1:33 PM  PATIENT:  Mario Underwood  53 y.o. male  PRE-OPERATIVE DIAGNOSIS:  osteoarthritis right knee  POST-OPERATIVE DIAGNOSIS:  osteoarthritis right knee  PROCEDURE:  Procedure(s): RIGHT TOTAL KNEE ARTHROPLASTY  SURGEON:  Surgeon(s): Dannielle HuhSteve Aireanna Luellen, MD  PHYSICIAN ASSISTANT: Altamese CabalMaurice Jones, [facility]  ANESTHESIA:   general  DRAINS: Hemovac  SPECIMEN: None  COUNTS:  Correct  TOURNIQUET:   Total Tourniquet Time Documented: Thigh (Right) - 58 minutes Total: Thigh (Right) - 58 minutes   DICTATION:  Indication for procedure:    The patient is a 53 y.o. male who has failed conservative treatment for osteoarthritis right knee.  Informed consent was obtained prior to anesthesia. The risks versus benefits of the operation were explain and in a way the patient can, and did, understand.   On the implant demand matching protocol, this patient scored 10.  Therefore, this patient was not receive a polyethylene insert with vitamin E which is a high demand implant.  Description of procedure:     The patient was taken to the operating room and placed under anesthesia.  The patient was positioned in the usual fashion taking care that all body parts were adequately padded and/or protected.  I foley catheter was not placed.  A tourniquet was applied and the leg prepped and draped in the usual sterile fashion.  The extremity was exsanguinated with the esmarch and tourniquet inflated to 350 mmHg.  Pre-operative range of motion was normal.  The knee was in 5 degree of mild varus.  A midline incision approximately 6-7 inches long was made with a #10 blade.  A new blade was used to make a parapatellar arthrotomy going 2-3 cm into the quadriceps tendon, over the patella, and alongside the medial aspect of the patellar tendon.  A synovectomy was then performed with the #10 blade and forceps. I then elevated the deep MCL off the medial tibial metaphysis  subperiosteally around to the semimembranosus attachment.    I everted the patella and used calipers to measure patellar thickness.  I used the reamer to ream down to appropriate thickness to recreate the native thickness.  I then removed excess bone with the rongeur and sagittal saw.  I used the appropriately sized template and drilled the three lug holes.  I then put the trial in place and measured the thickness with the calipers to ensure recreation of the native thickness.  The trial was then removed and the patella subluxed and the knee brought into flexion.  A homan retractor was place to retract and protect the patella and lateral structures.  A Z-retractor was place medially to protect the medial structures.  The extra-medullary alignment system was used to make cut the tibial articular surface perpendicular to the anamotic axis of the tibia and in 3 degrees of posterior slope.  The cut surface and alignment jig was removed.  I then used the intramedullary alignment guide to make a 6 valgus cut on the distal femur.  I then marked out the epicondylar axis on the distal femur.  The posterior condylar axis measured 3 degrees.  I then used the anterior referencing sizer and measured the femur to be a size 9.  The 4-In-1 cutting block was screwed into place in external rotation matching the posterior condylar angle, making our cuts perpendicular to the epicondylar axis.  Anterior, posterior and chamfer cuts were made with the sagittal saw.  The cutting block and cut pieces were removed.  A lamina spreader was placed in 90 degrees of flexion.  The ACL, PCL, menisci, and posterior condylar osteophytes were removed.  A 18 mm spacer blocked was found to offer good flexion and extension gap balance after moderate in degree releasing.   The scoop retractor was then placed and the femoral finishing block was pinned in place.  The small sagittal saw was used as well as the lug drill to finish the femur.  The  block and cut surfaces were removed and the medullary canal hole filled with autograft bone from the cut pieces.  The tibia was delivered forward in deep flexion and external rotation.  A size F tray was selected and pinned into place centered on the medial 1/3 of the tibial tubercle.  The reamer and keel was used to prepare the tibia through the tray.    I then trialed with the size 9 femur, size F tibia, a 18 mm insert and the 35 patella.  I had excellent flexion/extension gap balance, excellent patella tracking.  Flexion was full and beyond 120 degrees; extension was zero.  These components were chosen and the staff opened them to me on the back table while the knee was lavaged copiously and the cement mixed.  The soft tissue was infiltrated with 60cc of exparel 1.3% through a 21 gauge needle.  I cemented in the components and removed all excess cement.  The polyethylene tibial component was snapped into place and the knee placed in extension while cement was hardening.  The capsule was infilltrated with 30cc of .25% Marcaine with epinephrine.  A hemovac was place in the joint exiting superolaterally.  A pain pump was place superomedially superficial to the arthrotomy.  Once the cement was hard, the tourniquet was let down.  Hemostasis was obtained.  The arthrotomy was closed with figure-8 #1 vicryl sutures.  The deep soft tissues were closed with #0 vicryls and the subcuticular layer closed with a running #2-0 vicryl.  The skin was reapproximated and closed with skin staples.  The wound was dressed with xeroform, 4 x4's, 2 ABD sponges, a single layer of webril and a TED stocking.   The patient was then awakened, extubated, and taken to the recovery room in stable condition.  BLOOD LOSS:  300cc DRAINS: 1 hemovac, 1 pain catheter COMPLICATIONS:  None.  PLAN OF CARE: Admit to inpatient   PATIENT DISPOSITION:  PACU - hemodynamically stable.   Delay start of Pharmacological VTE agent (>24hrs) due to  surgical blood loss or risk of bleeding:  not applicable  Please fax a copy of this op note to my office at [phone] (please only include page 1 and 2 of the Case Information op note)

## 2013-10-08 NOTE — Plan of Care (Signed)
Problem: Consults Goal: Diagnosis- Total Joint Replacement Primary Total Knee Right     

## 2013-10-08 NOTE — Discharge Instructions (Signed)
Diet: As you were doing prior to hospitalization  ° °Activity:  Increase activity slowly as tolerated  °                No lifting or driving for 6 weeks ° °Shower:  May shower without a dressing once there is no drainage from your wound. °                Do NOT wash over the wound. °                °Dressing:  You may change your dressing on Wednesday °                   Then change the dressing daily with sterile 4"x4"s gauze dressing  °                   And TED hose for knees. ° °Weight Bearing:  Weight bearing as tolerated as taught in physical therapy.  Use a                                walker or Crutches as instructed. ° °To prevent constipation: you may use a stool softener such as - °              Colace ( over the counter) 100 mg by mouth twice a day  °              Drink plenty of fluids ( prune juice may be helpful) and high fiber foods °               Miralax ( over the counter) for constipation as needed.   ° °Precautions:  If you experience chest pain or shortness of breath - call 911 immediately               For transfer to the hospital emergency department!! °              If you develop a fever greater that 101 F, purulent drainage from wound,                             increased redness or drainage from wound, or calf pain -- Call the office. ° °Follow- Up Appointment:  Please call for an appointment to be seen on 10/22/13 °                                             [place] office:  [phone] °           [address] [place], [place] [address] °               ° ° °

## 2013-10-08 NOTE — Progress Notes (Signed)
SPORTS MEDICINE AND JOINT REPLACEMENT  Georgena Spurling, MD   Altamese Cabal, PA-C [address] [place], [place], [place]  [address]                             [phone]   PROGRESS NOTE  Subjective:  negative for Chest Pain  negative for Shortness of Breath  negative for Nausea/Vomiting   negative for Calf Pain  negative for Bowel Movement   Tolerating Diet: yes         Patient reports pain as 2 on 0-10 scale.    Objective: Vital signs in last 24 hours:   Patient Vitals for the past 24 hrs:  BP Temp Temp src Pulse Resp SpO2  10/08/13 0537 113/73 mmHg 98.1 F (36.7 C) Oral 75 18 97 %  10/08/13 0400 - - - - 18 97 %  10/08/13 0336 110/80 mmHg 98.3 F (36.8 C) Oral 98 18 96 %  10/08/13 0000 - - - - 18 98 %  10/07/13 2358 112/77 mmHg 98.4 F (36.9 C) Oral 94 18 98 %  10/07/13 2117 136/79 mmHg - - 89 - 97 %  10/07/13 2000 - - - - 18 97 %  10/07/13 1600 - - - - 18 -  10/07/13 1300 116/82 mmHg 98.2 F (36.8 C) Oral 71 18 95 %  10/07/13 1200 - - - - 18 -  10/07/13 1050 129/76 mmHg 98.1 F (36.7 C) Oral 75 18 97 %  10/07/13 1015 128/94 mmHg 97.9 F (36.6 C) - 77 19 97 %  10/07/13 1000 124/83 mmHg - - 69 16 97 %  10/07/13 0945 116/80 mmHg - - 72 16 97 %  10/07/13 0930 121/83 mmHg - - 78 16 100 %  10/07/13 0917 127/78 mmHg 97.5 F (36.4 C) - 87 15 98 %    @flow {1959:LAST@   Intake/Output from previous day:   03/16 0701 - 03/17 0700 In: 1480 [P.O.:480; I.V.:1000] Out: 1300 [Urine:1200; Drains:50]   Intake/Output this shift:       Intake/Output     03/16 0701 - 03/17 0700 03/17 0701 - 03/18 0700   P.O. 480    I.V. 1000    Total Intake 1480     Urine 1200    Drains 50    Blood 50    Total Output 1300     Net +180          Urine Occurrence 3 x 1 x      LABORATORY DATA:  Recent Labs  10/07/13 1220  WBC 20.7*  HGB 15.4  HCT 45.7  PLT 216    Recent Labs  10/07/13 1220  CREATININE 0.83   Lab Results  Component Value Date   INR 1.08 09/30/2013   INR  1.01 12/23/2010   INR 6.93 REPEATED TO VERIFY SPECIMEN CHECKED FOR CLOTS CRITICAL RESULT CALLED TO, READ BACK BY AND VERIFIED WITH: T TOMLINSON,RN 12/20/10 AT 2127 BY J HICKS* 12/20/2010    Examination:  General appearance: alert, cooperative and no distress Extremities: Homans sign is negative, no sign of DVT  Wound Exam: clean, dry, intact   Drainage:  Scant/small amount Serosanguinous exudate  Motor Exam: EHL and FHL Intact  Sensory Exam: Deep Peroneal normal   Assessment:    1 Day Post-Op  Procedure(s) (LRB): RIGHT TOTAL KNEE ARTHROPLASTY (Right)  ADDITIONAL DIAGNOSIS:  Active Problems:   S/P total knee arthroplasty  Acute Blood Loss Anemia  Plan: Physical Therapy as ordered Weight Bearing as Tolerated (WBAT)  DVT Prophylaxis:  Lovenox  DISCHARGE PLAN: Home  DISCHARGE NEEDS: HHPT, CPM, Walker and 3-in-1 comode seat         Amel Gianino 10/08/2013, 7:18 AM

## 2013-10-14 NOTE — Discharge Summary (Signed)
SPORTS MEDICINE & JOINT REPLACEMENT   Georgena Spurling, MD   Altamese Cabal, PA-C [address] [place], [place], [place]  [address]                             [phone]  PATIENT ID: Mario Underwood        MRN:  [id number]          DOB/AGE: 10/03/1960 / 53 y.o.    DISCHARGE SUMMARY  ADMISSION DATE:    10/07/2013 DISCHARGE DATE:   10/08/2013  ADMISSION DIAGNOSIS: osteoarthritis right knee    DISCHARGE DIAGNOSIS:  osteoarthritis right knee    ADDITIONAL DIAGNOSIS: Active Problems:   S/P total knee arthroplasty  Past Medical History  Diagnosis Date  . CAD (coronary artery disease)     A.  Inferior STEMI 12/12/10, treated with a Xience DES;  B.  Staged PCI of the LAD with a Promus DES;  C.  EF 55%, inferior AK  . Hypertension   . Hyperlipemia   . Osteoarthritis     Right knee TKR pending with Dr. Charlann Boxer  . Myocardial infarction 2012    2 stents placed  . Asthma     once.week    PROCEDURE: Procedure(s): RIGHT TOTAL KNEE ARTHROPLASTY on 10/07/2013  CONSULTS:     HISTORY:  See H&P in chart  HOSPITAL COURSE:  PRENTIS LANGDON is a 53 y.o. admitted on 10/07/2013 and found to have a diagnosis of osteoarthritis right knee.  After appropriate laboratory studies were obtained  they were taken to the operating room on 10/07/2013 and underwent Procedure(s): RIGHT TOTAL KNEE ARTHROPLASTY.   They were given perioperative antibiotics:  Anti-infectives   Start     Dose/Rate Route Frequency Ordered Stop   10/07/13 1900  vancomycin (VANCOCIN) IVPB 1000 mg/200 mL premix     1,000 mg 200 mL/hr over 60 Minutes Intravenous Every 12 hours 10/07/13 1050 10/07/13 1930   10/07/13 0600  vancomycin (VANCOCIN) IVPB 1000 mg/200 mL premix     1,000 mg 200 mL/hr over 60 Minutes Intravenous On call to O.R. 10/06/13 1357 10/07/13 0733    .  Tolerated the procedure well.  Placed with a foley intraoperatively.  Given Ofirmev at induction and for 48 hours.    POD# 1: Vital signs were stable.  Patient  denied Chest pain, shortness of breath, or calf pain.  Patient was started on Lovenox 30 mg subcutaneously twice daily at 8am.  Consults to PT, OT, and care management were made.  The patient was weight bearing as tolerated.  CPM was placed on the operative leg 0-90 degrees for 6-8 hours a day.  Incentive spirometry was taught.  Dressing was changed.  Marcaine pump and hemovac were discontinued.      POD #2, Continued  PT for ambulation and exercise program.  IV saline locked.  O2 discontinued.    The remainder of the hospital course was dedicated to ambulation and strengthening.   The patient was discharged on 1 day post op in  Good condition.  Blood products given:none  DIAGNOSTIC STUDIES: Recent vital signs: No data found.      Recent laboratory studies:  Recent Labs  10/07/13 1220 10/08/13 0730  WBC 20.7* 23.4*  HGB 15.4 14.6  HCT 45.7 43.4  PLT 216 219    Recent Labs  10/07/13 1220 10/08/13 0730  NA  --  139  K  --  5.2  CL  --  99  CO2  --  28  BUN  --  22  CREATININE 0.83 0.96  GLUCOSE  --  114*  CALCIUM  --  9.4   Lab Results  Component Value Date   INR 1.08 09/30/2013   INR 1.01 12/23/2010   INR 6.93 REPEATED TO VERIFY SPECIMEN CHECKED FOR CLOTS CRITICAL RESULT CALLED TO, READ BACK BY AND VERIFIED WITH: T TOMLINSON,RN 12/20/10 AT 2127 BY J HICKS* 12/20/2010     Recent Radiographic Studies :  Dg Chest 2 View  09/30/2013   CLINICAL DATA:  Right total knee replacement  EXAM: CHEST  2 VIEW  COMPARISON:  None.  FINDINGS: The heart size and mediastinal contours are within normal limits. Both lungs are clear. The visualized skeletal structures are unremarkable. Degenerative changes of the spine .  IMPRESSION: No active cardiopulmonary disease.   Electronically Signed   By: Ruel Favors M.D.   On: 09/30/2013 08:46    DISCHARGE INSTRUCTIONS: Discharge Orders   Future Orders Complete By Expires   Call MD / Call 911  As directed    Comments:     If you experience  chest pain or shortness of breath, CALL 911 and be transported to the hospital emergency room.  If you develope a fever above 101 F, pus (white drainage) or increased drainage or redness at the wound, or calf pain, call your surgeon's office.   Change dressing  As directed    Comments:     Change dressing on Wednesday, then change the dressing daily with sterile 4 x 4 inch gauze dressing and apply TED hose.   Constipation Prevention  As directed    Comments:     Drink plenty of fluids.  Prune juice may be helpful.  You may use a stool softener, such as Colace (over the counter) 100 mg twice a day.  Use MiraLax (over the counter) for constipation as needed.   CPM  As directed    Comments:     Continuous passive motion machine (CPM):      Use the CPM from 0 to 90 for 6-8 hours per day.      You may increase by 10 per day.  You may break it up into 2 or 3 sessions per day.      Use CPM for 2 weeks or until you are told to stop.   Diet - low sodium heart healthy  As directed    Do not put a pillow under the knee. Place it under the heel.  As directed    Driving restrictions  As directed    Comments:     No driving for 6 weeks   Increase activity slowly as tolerated  As directed    Lifting restrictions  As directed    Comments:     No lifting for 6 weeks   TED hose  As directed    Comments:     Use stockings (TED hose) for 2 weeks on both leg(s).  You may remove them at night for sleeping.      DISCHARGE MEDICATIONS:     Medication List    STOP taking these medications       aspirin 81 MG tablet     Fish Oil 1000 MG Caps      TAKE these medications       albuterol 108 (90 BASE) MCG/ACT inhaler  Commonly known as:  PROVENTIL HFA;VENTOLIN HFA  Inhale 1 puff into the lungs 2 (two) times  daily as needed for wheezing.     atorvastatin 80 MG tablet  Commonly known as:  LIPITOR  TAKE 1/2 TABLET EVERY DAY     celecoxib 200 MG capsule  Commonly known as:  CELEBREX  Take 1 capsule  (200 mg total) by mouth every 12 (twelve) hours.     DAILY MULTIVITAMIN PO  Take 1 tablet by mouth daily.     enoxaparin 40 MG/0.4ML injection  Commonly known as:  LOVENOX  Inject 0.4 mLs (40 mg total) into the skin daily.     Glucosamine-Chondroitin-MSM 500-200-150 MG Tabs  Take 1 tablet by mouth daily.     lisinopril 5 MG tablet  Commonly known as:  PRINIVIL,ZESTRIL  Take 1 tablet (5 mg total) by mouth daily.     methocarbamol 500 MG tablet  Commonly known as:  ROBAXIN  Take 1-2 tablets (500-1,000 mg total) by mouth every 6 (six) hours as needed for muscle spasms.     metoprolol tartrate 25 MG tablet  Commonly known as:  LOPRESSOR  Take 1 tablet (25 mg total) by mouth 2 (two) times daily.     nitroGLYCERIN 0.4 MG SL tablet  Commonly known as:  NITROSTAT  Place 0.4 mg under the tongue every 5 (five) minutes as needed for chest pain.     oxyCODONE 5 MG immediate release tablet  Commonly known as:  Oxy IR/ROXICODONE  Take 1-2 tablets (5-10 mg total) by mouth every 3 (three) hours as needed for breakthrough pain.        FOLLOW UP VISIT:       Follow-up Information   Follow up with Raymon MuttonLUCEY,STEPHEN D, MD. Call on 10/22/2013.   Specialty:  Orthopedic Surgery   Contact information:   [address] [place] [address] [phone]       DISPOSITION: HOME   CONDITION:  Good   Kacelyn Rowzee 10/14/2013, 8:16 AM

## 2013-12-18 ENCOUNTER — Encounter: Payer: Self-pay | Admitting: Cardiovascular Disease

## 2013-12-18 NOTE — Telephone Encounter (Signed)
New Message:  Pt's wife is requesting the pt be worked in to see Dr. Excell Seltzer. She scheduled the pt to see Lawson Fiscal. However, she would prefer the nurse call her back to set up an appt w/ Dr. Excell Seltzer instead.Marland KitchenMarland Kitchen

## 2013-12-19 NOTE — Telephone Encounter (Signed)
This encounter was created in error - please disregard.

## 2013-12-19 NOTE — Telephone Encounter (Signed)
Left message to call back  

## 2013-12-19 NOTE — Telephone Encounter (Signed)
Follow up     Wife calling back to speak with Lauren.

## 2014-01-21 ENCOUNTER — Ambulatory Visit: Payer: 59 | Admitting: Nurse Practitioner

## 2014-02-04 ENCOUNTER — Ambulatory Visit (INDEPENDENT_AMBULATORY_CARE_PROVIDER_SITE_OTHER): Payer: Commercial Managed Care - PPO | Admitting: Cardiovascular Disease

## 2014-02-04 ENCOUNTER — Encounter: Payer: Self-pay | Admitting: Cardiovascular Disease

## 2014-02-04 VITALS — BP 124/81 | HR 75 | Ht 71.0 in | Wt 213.4 lb

## 2014-02-04 DIAGNOSIS — I10 Essential (primary) hypertension: Secondary | ICD-10-CM

## 2014-02-04 DIAGNOSIS — E785 Hyperlipidemia, unspecified: Secondary | ICD-10-CM

## 2014-02-04 NOTE — Patient Instructions (Signed)
Your physician recommends that you return for a FASTING LIPID and LIVER Profile--nothing to eat or drink after midnight, lab opens at 7:30 AM  Your physician recommends that you continue on your current medications as directed. Please refer to the Current Medication list given to you today.  Your physician wants you to follow-up in: 1 YEAR with Dr Excell Seltzerooper.  You will receive a reminder letter in the mail two months in advance. If you don't receive a letter, please call our office to schedule the follow-up appointment.

## 2014-02-04 NOTE — Progress Notes (Signed)
HPI:   53 year old gentleman presenting for followup evaluation. The patient is followed for coronary artery disease. He presented initially in 2012 with an acute inferior wall MI and was treated with a drug-eluting stent in the right coronary artery. He had staged PCI of the proximal LAD. He's had no recurrent ischemic event since that time. His last nuclear scan was in 2013 and this demonstrated a fixed defect in the inferobasal and apical walls without significant ischemia. His left ventricular ejection fraction was 50%.  He can have his knee replaced several months ago. He is having a good recovery. He's been very active without exertional symptoms. He specifically denies chest pain, chest pressure, or shortness of breath. His mom passed away this past year he has been spending a lot of time with his father.   Outpatient Encounter Prescriptions as of 02/04/2014  Medication Sig  . albuterol (PROVENTIL HFA;VENTOLIN HFA) 108 (90 BASE) MCG/ACT inhaler Inhale 1 puff into the lungs 2 (two) times daily as needed for wheezing.  Marland Kitchen atorvastatin (LIPITOR) 80 MG tablet TAKE 1/2 TABLET EVERY DAY  . Glucosamine-Chondroitin-MSM 500-200-150 MG TABS Take 1 tablet by mouth daily.   Marland Kitchen lisinopril (PRINIVIL,ZESTRIL) 5 MG tablet Take 1 tablet (5 mg total) by mouth daily.  . metoprolol tartrate (LOPRESSOR) 25 MG tablet Take 1 tablet (25 mg total) by mouth 2 (two) times daily.  . Multiple Vitamin (DAILY MULTIVITAMIN PO) Take 1 tablet by mouth daily.   . nitroGLYCERIN (NITROSTAT) 0.4 MG SL tablet Place 0.4 mg under the tongue every 5 (five) minutes as needed for chest pain.  . [DISCONTINUED] celecoxib (CELEBREX) 200 MG capsule Take 1 capsule (200 mg total) by mouth every 12 (twelve) hours.  . [DISCONTINUED] enoxaparin (LOVENOX) 40 MG/0.4ML injection Inject 0.4 mLs (40 mg total) into the skin daily.  . [DISCONTINUED] methocarbamol (ROBAXIN) 500 MG tablet Take 1-2 tablets (500-1,000 mg total) by mouth every 6 (six)  hours as needed for muscle spasms.  . [DISCONTINUED] oxyCODONE (OXY IR/ROXICODONE) 5 MG immediate release tablet Take 1-2 tablets (5-10 mg total) by mouth every 3 (three) hours as needed for breakthrough pain.  . [DISCONTINUED] zolpidem (AMBIEN CR) 12.5 MG CR tablet     Allergies  Allergen Reactions  . Penicillins Hives, Swelling and Rash    Past Medical History  Diagnosis Date  . CAD (coronary artery disease)     A.  Inferior STEMI 12/12/10, treated with a Xience DES;  B.  Staged PCI of the LAD with a Promus DES;  C.  EF 55%, inferior AK  . Hypertension   . Hyperlipemia   . Osteoarthritis     Right knee TKR pending with Dr. Charlann Boxer  . Myocardial infarction 2012    2 stents placed  . Asthma     once.week    ROS: Negative except as per HPI  BP 124/81  Pulse 75  Ht 5\' 11"  (1.803 m)  Wt 213 lb 6.4 oz (96.798 kg)  BMI 29.78 kg/m2  PHYSICAL EXAM: Pt is alert and oriented, NAD HEENT: normal Neck: JVP - normal, carotids 2+= without bruits Lungs: CTA bilaterally CV: RRR without murmur or gallop Abd: soft, NT, Positive BS, no hepatomegaly Ext: Mild edema on the right (side of knee replacement), distal pulses intact and equal Skin: warm/dry no rash  EKG:  Normal sinus rhythm 75 beats per minute, left axis deviation, inferior MI age undetermined.  ASSESSMENT AND PLAN: 1. Coronary artery disease, native vessel. The patient is stable without symptoms  of angina. He remains on aspirin and a statin drug. He will return in one year for followup evaluation.  2. Hyperlipidemia. He continues with atorvastatin 40 mg daily. Lipids from one year ago reviewed with a cholesterol of 126, HDL 40, LDL 63. He will have repeat lipids drawn. We discussed the importance of diet and exercise.  3. Hypertension. Blood pressure is well controlled on metoprolol and lisinopril.  For followup I will see him back in one year.  Mario BollmanMichael Kristapher Underwood 02/04/2014 3:55 PM

## 2014-02-17 ENCOUNTER — Other Ambulatory Visit: Payer: Self-pay | Admitting: Nurse Practitioner

## 2014-02-27 ENCOUNTER — Other Ambulatory Visit: Payer: Commercial Managed Care - PPO

## 2014-02-28 ENCOUNTER — Other Ambulatory Visit (INDEPENDENT_AMBULATORY_CARE_PROVIDER_SITE_OTHER): Payer: Commercial Managed Care - PPO

## 2014-02-28 DIAGNOSIS — E785 Hyperlipidemia, unspecified: Secondary | ICD-10-CM

## 2014-02-28 DIAGNOSIS — I10 Essential (primary) hypertension: Secondary | ICD-10-CM

## 2014-02-28 LAB — LIPID PANEL
Cholesterol: 126 mg/dL (ref 0–200)
HDL: 35.4 mg/dL — ABNORMAL LOW (ref >39.00)
LDL Cholesterol: 65 mg/dL (ref 0–99)
NONHDL: 90.6
Total CHOL/HDL Ratio: 4
Triglycerides: 128 mg/dL (ref 0.0–149.0)
VLDL: 25.6 mg/dL (ref 0.0–40.0)

## 2014-02-28 LAB — HEPATIC FUNCTION PANEL
ALK PHOS: 103 U/L (ref 39–117)
ALT: 20 U/L (ref 0–53)
AST: 20 U/L (ref 0–37)
Albumin: 4.1 g/dL (ref 3.5–5.2)
BILIRUBIN TOTAL: 0.8 mg/dL (ref 0.2–1.2)
Bilirubin, Direct: 0 mg/dL (ref 0.0–0.3)
Total Protein: 7 g/dL (ref 6.0–8.3)

## 2014-03-17 ENCOUNTER — Other Ambulatory Visit: Payer: Self-pay | Admitting: Nurse Practitioner

## 2014-03-23 ENCOUNTER — Other Ambulatory Visit: Payer: Self-pay | Admitting: Nurse Practitioner

## 2014-06-22 ENCOUNTER — Other Ambulatory Visit: Payer: Self-pay | Admitting: Nurse Practitioner

## 2014-06-29 ENCOUNTER — Other Ambulatory Visit: Payer: Self-pay | Admitting: Nurse Practitioner

## 2015-01-14 ENCOUNTER — Other Ambulatory Visit: Payer: Self-pay | Admitting: Nurse Practitioner

## 2015-01-14 MED ORDER — METOPROLOL TARTRATE 25 MG PO TABS: 25.0000 mg | ORAL_TABLET | Freq: Two times a day (BID) | ORAL | Status: DC

## 2015-01-14 MED ORDER — ATORVASTATIN CALCIUM 80 MG PO TABS: 40.0000 mg | ORAL_TABLET | Freq: Every day | ORAL | Status: DC

## 2015-03-13 ENCOUNTER — Ambulatory Visit (INDEPENDENT_AMBULATORY_CARE_PROVIDER_SITE_OTHER): Payer: Commercial Managed Care - PPO | Admitting: Cardiovascular Disease

## 2015-03-13 ENCOUNTER — Encounter: Payer: Self-pay | Admitting: Cardiovascular Disease

## 2015-03-13 VITALS — BP 128/90 | HR 72 | Ht 72.0 in | Wt 225.0 lb

## 2015-03-13 DIAGNOSIS — E785 Hyperlipidemia, unspecified: Secondary | ICD-10-CM

## 2015-03-13 DIAGNOSIS — I10 Essential (primary) hypertension: Secondary | ICD-10-CM

## 2015-03-13 DIAGNOSIS — I252 Old myocardial infarction: Secondary | ICD-10-CM | POA: Diagnosis not present

## 2015-03-13 IMAGING — CR DG CHEST 2V
2 series · 2 of 2 positions shown · non-contrast
Comparison: None.

CLINICAL DATA: Right total knee replacement

EXAM:
CHEST  2 VIEW

[w chest pa]
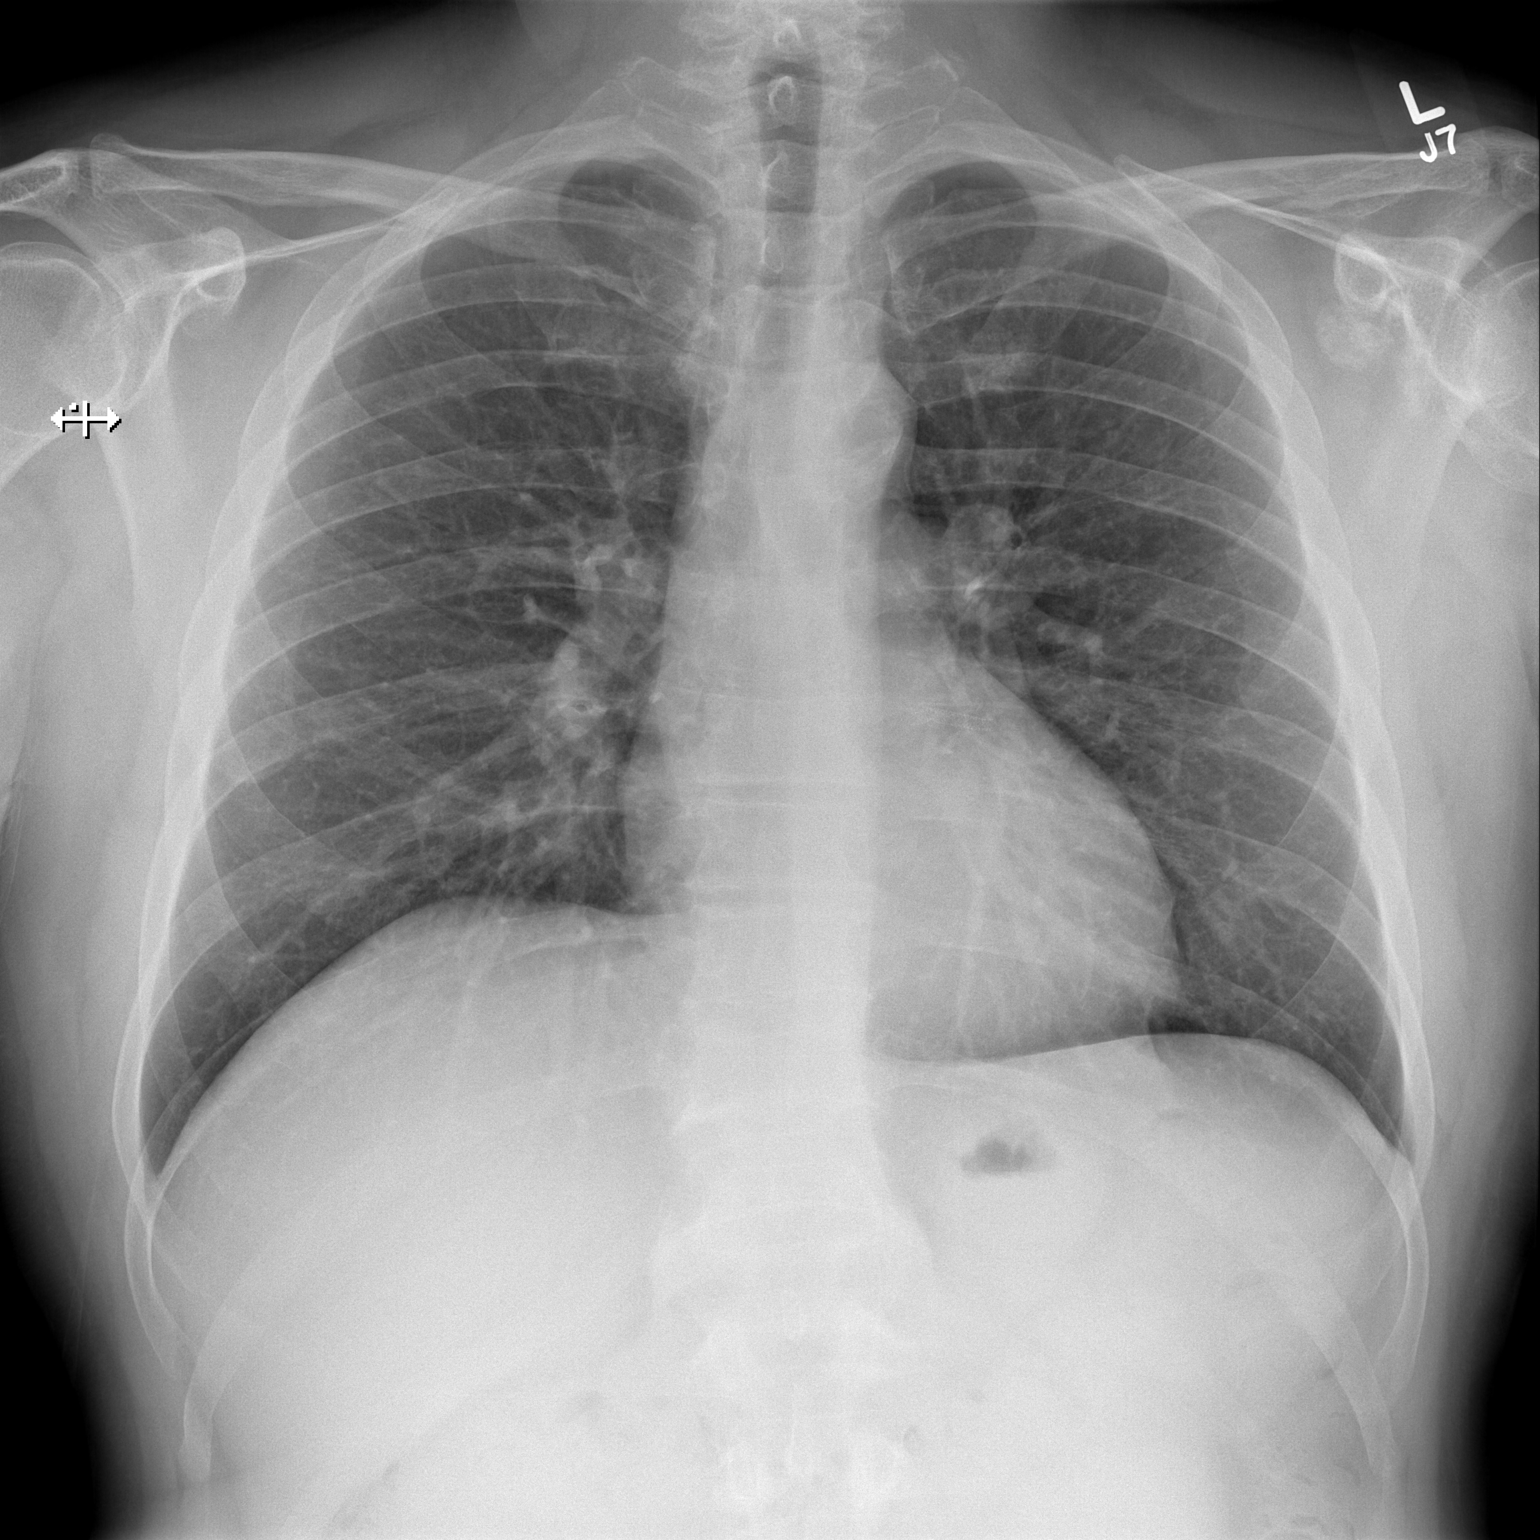

[w chest lat]
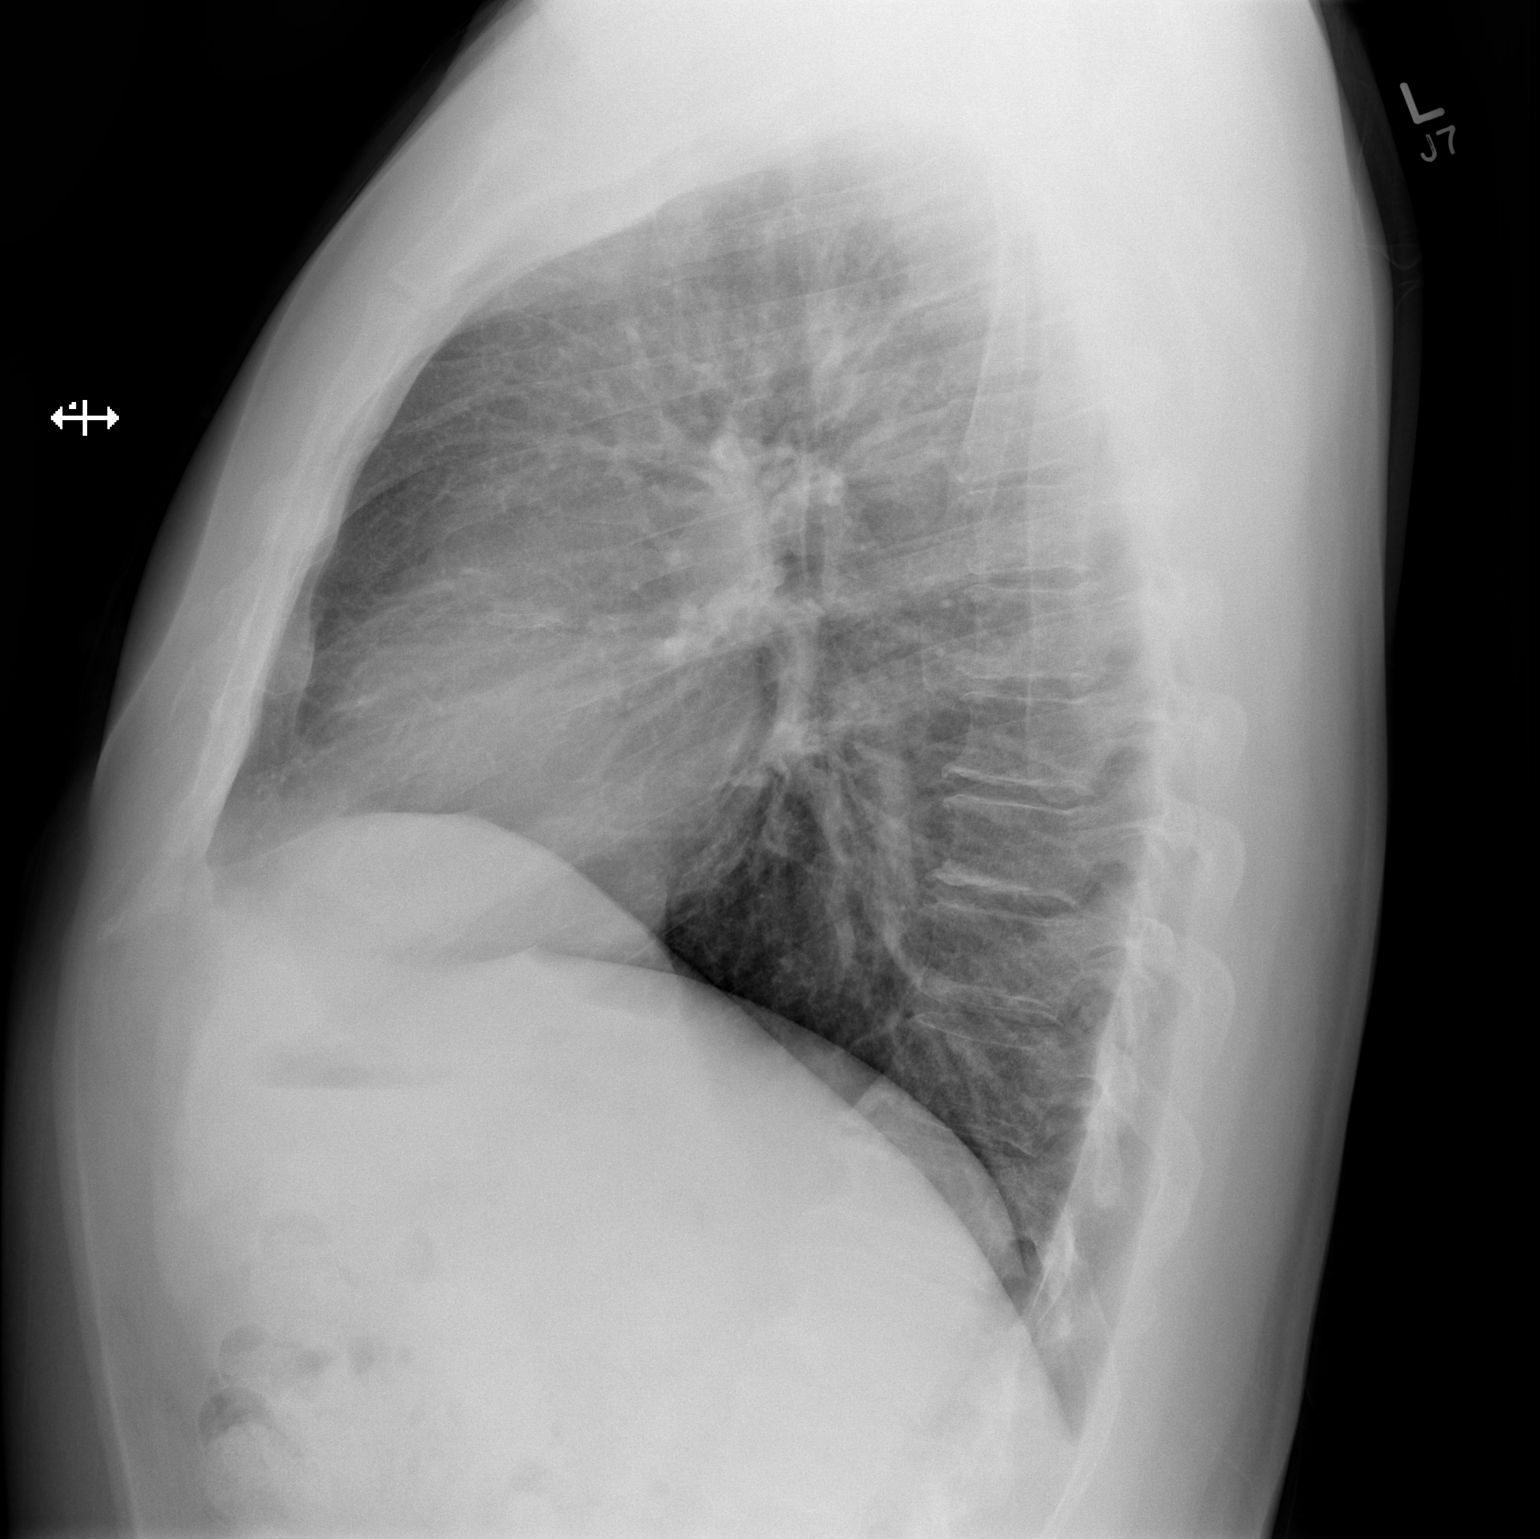

[2 of 2 positions shown; findings below may reference images not displayed]

FINDINGS: The heart size and mediastinal contours are within normal limits.
Both lungs are clear. The visualized skeletal structures are
unremarkable. Degenerative changes of the spine .
IMPRESSION: No active cardiopulmonary disease.

## 2015-03-13 MED ORDER — LISINOPRIL 10 MG PO TABS: 10.0000 mg | ORAL_TABLET | Freq: Every day | ORAL | Status: DC

## 2015-03-13 MED ORDER — METOPROLOL TARTRATE 25 MG PO TABS: 25.0000 mg | ORAL_TABLET | Freq: Two times a day (BID) | ORAL | Status: DC

## 2015-03-13 MED ORDER — NITROGLYCERIN 0.4 MG SL SUBL: 0.4000 mg | SUBLINGUAL_TABLET | SUBLINGUAL | Status: DC | PRN

## 2015-03-13 MED ORDER — ATORVASTATIN CALCIUM 80 MG PO TABS: 40.0000 mg | ORAL_TABLET | Freq: Every day | ORAL | Status: DC

## 2015-03-13 NOTE — Patient Instructions (Signed)
Medication Instructions:  Your physician has recommended you make the following change in your medication:  1. INCREASE Lisinopril to  take one by mouth daily  Labwork: Your physician recommends that you have lab work today: CMP and LIPID  Testing/Procedures: No new orders.   Follow-Up: Your physician wants you to follow-up in: 1 YEAR with Dr Excell Seltzer.  You will receive a reminder letter in the mail two months in advance. If you don't receive a letter, please call our office to schedule the follow-up appointment.   Any Other Special Instructions Will Be Listed Below (If Applicable).

## 2015-03-13 NOTE — Addendum Note (Signed)
Addended by: Channing Mutters on: 03/13/2015 05:34 PM   Modules accepted: Orders

## 2015-03-13 NOTE — Progress Notes (Signed)
Cardiology Office Note Date:  03/13/2015   ID:  Mario Underwood, DOB 05-15-1961, MRN [id number]  PCP:  Mario Anes, MD  Cardiologist:  Mario Mocha, MD    Chief Complaint  Patient presents with  . Coronary Artery Disease    History of Present Illness: Mario Underwood is a 54 y.o. male who presents for follow-up evaluation.  The patient is followed for coronary artery disease. He presented initially in 2012 with an acute inferior wall MI and was treated with a drug-eluting stent in the right coronary artery. He had staged PCI of the proximal LAD. He's had no recurrent ischemic event since that time. His last nuclear scan was in 2013 and this demonstrated a fixed defect in the inferobasal and apical walls without significant ischemia. His left ventricular ejection fraction was 50%.  The patient is doing well. No CP, dyspnea, or edema. No complaints today. Knee pain has resolved since surgery. Not eating as well as in past and has gained some weight.  Past Medical History  Diagnosis Date  . CAD (coronary artery disease)     A.  Inferior STEMI 12/12/10, treated with a Xience DES;  B.  Staged PCI of the LAD with a Promus DES;  C.  EF 55%, inferior AK  . Hypertension   . Hyperlipemia   . Osteoarthritis     Right knee TKR pending with Dr. Alvan Dame  . Myocardial infarction 2012    2 stents placed  . Asthma     once.week    Past Surgical History  Procedure Laterality Date  . Coronary stent placement  2012  . Hernia repair      left inguinal  . Arthroscopic repair acl Right   . Urology procedure    . Total knee arthroplasty Right 10/07/2013    Procedure: RIGHT TOTAL KNEE ARTHROPLASTY;  Surgeon: Vickey Huger, MD;  Location: [facility];  Service: Orthopedics;  Laterality: Right;    Current Outpatient Prescriptions  Medication Sig Dispense Refill  . albuterol (PROVENTIL HFA;VENTOLIN HFA) 108 (90 BASE) MCG/ACT inhaler Inhale 1 puff into the lungs 2 (two) times daily as needed for  wheezing. 1 Inhaler 2  . aspirin EC 81 MG tablet Take 81 mg by mouth daily.    Marland Kitchen atorvastatin (LIPITOR) 80 MG tablet Take 0.5 tablets (40 mg total) by mouth daily. 30 tablet 11  . Glucosamine-Chondroitin-MSM 500-200-150 MG TABS Take 1 tablet by mouth daily.     Marland Kitchen lisinopril (PRINIVIL,ZESTRIL) 10 MG tablet Take 1 tablet (10 mg total) by mouth daily. 30 tablet 11  . metoprolol tartrate (LOPRESSOR) 25 MG tablet Take 1 tablet (25 mg total) by mouth 2 (two) times daily. 60 tablet 11  . Multiple Vitamin (DAILY MULTIVITAMIN PO) Take 1 tablet by mouth daily.     . nitroGLYCERIN (NITROSTAT) 0.4 MG SL tablet Place 1 tablet (0.4 mg total) under the tongue every 5 (five) minutes as needed for chest pain. 25 tablet 3   No current facility-administered medications for this visit.    Allergies:   Penicillins   Social History:  The patient  reports that he has never smoked. His smokeless tobacco use includes Chew. He reports that he drinks alcohol. He reports that he does not use illicit drugs.   Family History:  The patient's  family history includes Coronary artery disease in his sister; Heart disease in his mother.    ROS:  Please see the history of present illness.  All other systems are reviewed and  negative.    PHYSICAL EXAM: VS:  BP 128/90 mmHg  Pulse 72  Ht 6' (1.829 m)  Wt 225 lb (102.059 kg)  BMI 30.51 kg/m2 , BMI Body mass index is 30.51 kg/(m^2). GEN: Well nourished, well developed, in no acute distress HEENT: normal Neck: no JVD, no masses. No carotid bruits Cardiac: RRR without murmur or gallop                Respiratory:  clear to auscultation bilaterally, normal work of breathing GI: soft, nontender, nondistended, + BS MS: no deformity or atrophy Ext: no pretibial edema, pedal pulses 2+= bilaterally Skin: warm and dry, no rash Neuro:  Strength and sensation are intact Psych: euthymic mood, full affect  EKG:  EKG is ordered today. The ekg ordered today shows NSR, 72 bpm,  inferior MI age undetermined  Recent Labs: No results found for requested labs within last 365 days.   Lipid Panel     Component Value Date/Time   CHOL 126 02/28/2014 0826   TRIG 128.0 02/28/2014 0826   HDL 35.40* 02/28/2014 0826   CHOLHDL 4 02/28/2014 0826   VLDL 25.6 02/28/2014 0826   LDLCALC 65 02/28/2014 0826   LDLDIRECT  12/22/2010 0345    82 (NOTE) ATP III Classification (LDL):      < 100        mg/dL         Optimal     100 - 129     mg/dL         Near or Above Optimal     130 - 159     mg/dL         Borderline High     160 - 189     mg/dL         High      > 190        mg/dL         Very  High       Wt Readings from Last 3 Encounters:  03/13/15 225 lb (102.059 kg)  02/04/14 213 lb 6.4 oz (96.798 kg)  09/30/13 216 lb 6.4 oz (98.158 kg)    ASSESSMENT AND PLAN: 1.  CAD, native vessel: the patient is stable without symptoms of angina. We discussed the importance of refill is seen on lifestyle modification with diet and exercise. His medications will be continued with the helmet change being an increase in his lisinopril dose (see below)  2. Hyperlipidemia: recheck lipids and LFTs today. The patient is on high intensity statin therapy with atorvastatin.  3. HTN: blood pressure is borderline. I'm going to increase lisinopril to 10 mg daily. He will focus on diet, sodium restriction, and weight loss.   Current medicines are reviewed with the patient today.  The patient does not have concerns regarding medicines.  Labs/ tests ordered today include:   Orders Placed This Encounter  Procedures  . Comp Met (CMET)  . Lipid panel  . EKG 12-Lead    Disposition:   FU one year  Signed, Mario Mocha, MD  03/13/2015 4:35 PM    [facility], [place], [place]  [address] Phone: [phone]; Fax: [phone]

## 2015-03-14 LAB — COMPREHENSIVE METABOLIC PANEL
ALK PHOS: 92 U/L (ref 40–115)
ALT: 25 U/L (ref 9–46)
AST: 24 U/L (ref 10–35)
Albumin: 4.3 g/dL (ref 3.6–5.1)
BUN: 12 mg/dL (ref 7–25)
CO2: 23 mmol/L (ref 20–31)
Calcium: 9.5 mg/dL (ref 8.6–10.3)
Chloride: 105 mmol/L (ref 98–110)
Creat: 0.78 mg/dL (ref 0.70–1.33)
Glucose, Bld: 80 mg/dL (ref 65–99)
POTASSIUM: 3.7 mmol/L (ref 3.5–5.3)
Sodium: 138 mmol/L (ref 135–146)
TOTAL PROTEIN: 6.9 g/dL (ref 6.1–8.1)
Total Bilirubin: 0.7 mg/dL (ref 0.2–1.2)

## 2015-03-14 LAB — LIPID PANEL
CHOLESTEROL: 131 mg/dL (ref 125–200)
HDL: 31 mg/dL — AB (ref >=40)
LDL Cholesterol: 75 mg/dL (ref <130)
Total CHOL/HDL Ratio: 4.2 Ratio (ref <=5.0)
Triglycerides: 124 mg/dL (ref <150)
VLDL: 25 mg/dL (ref <30)

## 2015-11-24 ENCOUNTER — Other Ambulatory Visit: Payer: Self-pay | Admitting: Cardiovascular Disease

## 2015-11-24 ENCOUNTER — Other Ambulatory Visit: Payer: Self-pay

## 2015-11-24 DIAGNOSIS — I10 Essential (primary) hypertension: Secondary | ICD-10-CM

## 2015-11-24 DIAGNOSIS — I252 Old myocardial infarction: Secondary | ICD-10-CM

## 2015-11-24 DIAGNOSIS — E785 Hyperlipidemia, unspecified: Secondary | ICD-10-CM

## 2015-11-24 MED ORDER — METOPROLOL TARTRATE 25 MG PO TABS: 25.0000 mg | ORAL_TABLET | Freq: Two times a day (BID) | ORAL | Status: DC

## 2016-02-15 ENCOUNTER — Other Ambulatory Visit: Payer: Self-pay

## 2016-02-15 DIAGNOSIS — I10 Essential (primary) hypertension: Secondary | ICD-10-CM

## 2016-02-15 DIAGNOSIS — I252 Old myocardial infarction: Secondary | ICD-10-CM

## 2016-02-15 DIAGNOSIS — E785 Hyperlipidemia, unspecified: Secondary | ICD-10-CM

## 2016-02-15 MED ORDER — LISINOPRIL 10 MG PO TABS: 10.0000 mg | ORAL_TABLET | Freq: Every day | ORAL | 0 refills | Status: DC

## 2016-02-15 NOTE — Telephone Encounter (Signed)
Mario Underwood   lisinopril (PRINIVIL,ZESTRIL) 10 MG tablet Take 1 tablet (10 mg total) by mouth daily  3. HTN: blood pressure is borderline. I'm going to increase lisinopril to 10 mg daily. He will focus on diet, sodium restriction, and weight loss.   Medication Instructions:  Your physician has recommended you make the following change in your medication:  1. INCREASE Lisinopril to 10mg  take one by mouth daily

## 2016-02-19 ENCOUNTER — Encounter: Payer: Self-pay | Admitting: Cardiovascular Disease

## 2016-02-19 ENCOUNTER — Ambulatory Visit (INDEPENDENT_AMBULATORY_CARE_PROVIDER_SITE_OTHER): Payer: Commercial Managed Care - PPO | Admitting: Cardiovascular Disease

## 2016-02-19 DIAGNOSIS — I10 Essential (primary) hypertension: Secondary | ICD-10-CM

## 2016-02-19 DIAGNOSIS — E785 Hyperlipidemia, unspecified: Secondary | ICD-10-CM | POA: Diagnosis not present

## 2016-02-19 DIAGNOSIS — I252 Old myocardial infarction: Secondary | ICD-10-CM | POA: Diagnosis not present

## 2016-02-19 LAB — COMPREHENSIVE METABOLIC PANEL
ALT: 24 U/L (ref 9–46)
AST: 24 U/L (ref 10–35)
Albumin: 4.5 g/dL (ref 3.6–5.1)
Alkaline Phosphatase: 103 U/L (ref 40–115)
BILIRUBIN TOTAL: 0.7 mg/dL (ref 0.2–1.2)
BUN: 12 mg/dL (ref 7–25)
CO2: 25 mmol/L (ref 20–31)
CREATININE: 0.98 mg/dL (ref 0.70–1.33)
Calcium: 9.8 mg/dL (ref 8.6–10.3)
Chloride: 105 mmol/L (ref 98–110)
GLUCOSE: 87 mg/dL (ref 65–99)
Potassium: 4.4 mmol/L (ref 3.5–5.3)
SODIUM: 139 mmol/L (ref 135–146)
Total Protein: 7.1 g/dL (ref 6.1–8.1)

## 2016-02-19 LAB — LIPID PANEL
Cholesterol: 136 mg/dL (ref 125–200)
HDL: 40 mg/dL (ref >=40)
LDL CALC: 66 mg/dL (ref <130)
Total CHOL/HDL Ratio: 3.4 Ratio (ref <=5.0)
Triglycerides: 150 mg/dL — ABNORMAL HIGH (ref <150)
VLDL: 30 mg/dL (ref <30)

## 2016-02-19 MED ORDER — ATORVASTATIN CALCIUM 80 MG PO TABS: 40.0000 mg | ORAL_TABLET | Freq: Every day | ORAL | 11 refills | Status: DC

## 2016-02-19 MED ORDER — METOPROLOL TARTRATE 25 MG PO TABS: 25.0000 mg | ORAL_TABLET | Freq: Two times a day (BID) | ORAL | 11 refills | Status: DC

## 2016-02-19 MED ORDER — LISINOPRIL 10 MG PO TABS: 10.0000 mg | ORAL_TABLET | Freq: Every day | ORAL | 11 refills | Status: DC

## 2016-02-19 NOTE — Patient Instructions (Signed)
Medication Instructions:  Your physician recommends that you continue on your current medications as directed. Please refer to the Current Medication list given to you today.  Labwork: Your physician recommends that you have lab work today: CMP and LIPID  Testing/Procedures: No new orders.   Follow-Up: Your physician wants you to follow-up in: 1 YEAR with Dr Cooper.  You will receive a reminder letter in the mail two months in advance. If you don't receive a letter, please call our office to schedule the follow-up appointment.   Any Other Special Instructions Will Be Listed Below (If Applicable).     If you need a refill on your cardiac medications before your next appointment, please call your pharmacy.   

## 2016-02-19 NOTE — Progress Notes (Signed)
Cardiology Office Note Date:  02/21/2016   ID:  Mario Underwood, DOB [birth year], MRN [id number]  PCP:  Lillard Anes, MD  Cardiologist:  Sherren Mocha, MD    Chief Complaint  Patient presents with  . Follow-up    CAD     History of Present Illness: Mario Underwood is a 55 y.o. male who presents for follow-up evaluation.  The patient is followed for coronary artery disease. He presented initially in 2012 with an acute inferior wall MI and was treated with a drug-eluting stent in the right coronary artery. He had staged PCI of the proximal LAD. He's had no recurrent ischemic event since that time. His last nuclear scan was in 2013 and this demonstrated a fixed defect in the inferobasal and apical walls without significant ischemia. His left ventricular ejection fraction was 50%.  He is doing well and has no cardiac related complaints today. Today, he denies symptoms of palpitations, chest pain, shortness of breath, orthopnea, PND, lower extremity edema, dizziness, or syncope.    Past Medical History:  Diagnosis Date  . Asthma    once.week  . CAD (coronary artery disease)    A.  Inferior STEMI 12/12/10, treated with a Xience DES;  B.  Staged PCI of the LAD with a Promus DES;  C.  EF 55%, inferior AK  . Hyperlipemia   . Hypertension   . Myocardial infarction [facility]) 2012   2 stents placed  . Osteoarthritis    Right knee TKR pending with Dr. Alvan Dame    Past Surgical History:  Procedure Laterality Date  . ARTHROSCOPIC REPAIR ACL Right   . CORONARY STENT PLACEMENT  2012  . HERNIA REPAIR     left inguinal  . TOTAL KNEE ARTHROPLASTY Right 10/07/2013   Procedure: RIGHT TOTAL KNEE ARTHROPLASTY;  Surgeon: Vickey Huger, MD;  Location: [facility];  Service: Orthopedics;  Laterality: Right;  . urology procedure      Current Outpatient Prescriptions  Medication Sig Dispense Refill  . albuterol (PROVENTIL HFA;VENTOLIN HFA) 108 (90 BASE) MCG/ACT inhaler Inhale 1 puff into the lungs 2  (two) times daily as needed for wheezing. 1 Inhaler 2  . aspirin EC 81 MG tablet Take 81 mg by mouth daily.    Marland Kitchen atorvastatin (LIPITOR) 80 MG tablet Take 0.5 tablets (40 mg total) by mouth daily. 30 tablet 11  . Glucosamine-Chondroitin-MSM 500-200-150 MG TABS Take 1 tablet by mouth daily.     Marland Kitchen lisinopril (PRINIVIL,ZESTRIL) 10 MG tablet Take 1 tablet (10 mg total) by mouth daily. 30 tablet 11  . metoprolol tartrate (LOPRESSOR) 25 MG tablet Take 1 tablet (25 mg total) by mouth 2 (two) times daily. 60 tablet 11  . Multiple Vitamin (DAILY MULTIVITAMIN PO) Take 1 tablet by mouth daily.     . nitroGLYCERIN (NITROSTAT) 0.4 MG SL tablet Place 1 tablet (0.4 mg total) under the tongue every 5 (five) minutes as needed for chest pain. 25 tablet 3   No current facility-administered medications for this visit.     Allergies:   Penicillins   Social History:  The patient  reports that he has never smoked. His smokeless tobacco use includes Chew. He reports that he drinks alcohol. He reports that he does not use drugs.   Family History:  The patient's family history includes Coronary artery disease in his sister; Heart disease in his mother.    ROS:  Please see the history of present illness.  All other systems are reviewed and negative.  PHYSICAL EXAM: VS:  BP 128/84   Pulse 64   Ht 6' (1.829 m)   Wt 227 lb 12.8 oz (103.3 kg)   BMI 30.90 kg/m  , BMI Body mass index is 30.9 kg/m. GEN: Well nourished, well developed, in no acute distress  HEENT: normal  Neck: no JVD, no masses. No carotid bruits Cardiac: RRR without murmur or gallop                Respiratory:  clear to auscultation bilaterally, normal work of breathing GI: soft, nontender, nondistended, + BS MS: no deformity or atrophy  Ext: no pretibial edema, pedal pulses 2+= bilaterally Skin: warm and dry, no rash Neuro:  Strength and sensation are intact Psych: euthymic mood, full affect  EKG:  EKG is ordered today. The ekg ordered  today shows NSR 64 bpm, moderate criteria for LVH, age indeterminate inferior infarct  Recent Labs: 02/19/2016: ALT 24; BUN 12; Creat 0.98; Potassium 4.4; Sodium 139   Lipid Panel     Component Value Date/Time   CHOL 136 02/19/2016 1021   TRIG 150 (H) 02/19/2016 1021   HDL 40 02/19/2016 1021   CHOLHDL 3.4 02/19/2016 1021   VLDL 30 02/19/2016 1021   LDLCALC 66 02/19/2016 1021   LDLDIRECT  12/22/2010 0345    82 (NOTE) ATP III Classification (LDL):      < 100        mg/dL         Optimal     100 - 129     mg/dL         Near or Above Optimal     130 - 159     mg/dL         Borderline High     160 - 189     mg/dL         High      > 190        mg/dL         Very  High       Wt Readings from Last 3 Encounters:  02/19/16 227 lb 12.8 oz (103.3 kg)  03/13/15 225 lb (102.1 kg)  02/04/14 213 lb 6.4 oz (96.8 kg)    ASSESSMENT AND PLAN: 1.  CAD, native vessel, without angina: pt doing well on current Rx. Medications reviewed and no changes suggested. Discussed weight loss strategies and lifestyle modification.  2. HTN: BP controlled. He is motivated to lose weight to help with BP.  3. Hyperlipidemia: on a high-intensity statin drug. Check lipids/LFT's today.  Current medicines are reviewed with the patient today.  The patient does not have concerns regarding medicines.  Labs/ tests ordered today include:   Orders Placed This Encounter  Procedures  . Comp Met (CMET)  . Lipid panel  . EKG 12-Lead   Disposition:   FU one year  Signed, Sherren Mocha, MD  02/21/2016 10:17 PM    [facility] [place], [place], [place]  [address] Phone: [phone]; Fax: [phone]

## 2016-03-14 ENCOUNTER — Other Ambulatory Visit: Payer: Self-pay | Admitting: Cardiovascular Disease

## 2016-03-14 DIAGNOSIS — I252 Old myocardial infarction: Secondary | ICD-10-CM

## 2016-03-14 DIAGNOSIS — I10 Essential (primary) hypertension: Secondary | ICD-10-CM

## 2016-03-14 DIAGNOSIS — E785 Hyperlipidemia, unspecified: Secondary | ICD-10-CM

## 2016-04-07 ENCOUNTER — Ambulatory Visit: Payer: Commercial Managed Care - PPO | Admitting: Cardiovascular Disease

## 2016-09-13 ENCOUNTER — Other Ambulatory Visit: Payer: Self-pay | Admitting: Cardiovascular Disease

## 2016-09-13 DIAGNOSIS — E785 Hyperlipidemia, unspecified: Secondary | ICD-10-CM

## 2016-09-13 DIAGNOSIS — I252 Old myocardial infarction: Secondary | ICD-10-CM

## 2016-09-13 DIAGNOSIS — I10 Essential (primary) hypertension: Secondary | ICD-10-CM

## 2017-01-30 ENCOUNTER — Telehealth: Payer: Self-pay | Admitting: Cardiovascular Disease

## 2017-01-30 NOTE — Telephone Encounter (Signed)
Left message informing dtr that Leotis ShamesLauren is not in the office today but will return her call when she returns.

## 2017-01-30 NOTE — Telephone Encounter (Signed)
New message    Pt wife is calling asking for a call back from Lauren. She did not say what it was about.

## 2017-02-01 NOTE — Telephone Encounter (Signed)
I spoke with the pt and he called the office to make his yearly appointment with Dr Excell Seltzerooper. Appointment booked for 02/02/17.

## 2017-02-02 ENCOUNTER — Ambulatory Visit (INDEPENDENT_AMBULATORY_CARE_PROVIDER_SITE_OTHER): Payer: Commercial Managed Care - PPO | Admitting: Cardiovascular Disease

## 2017-02-02 ENCOUNTER — Encounter: Payer: Self-pay | Admitting: Cardiovascular Disease

## 2017-02-02 VITALS — BP 124/76 | HR 82 | Ht 72.0 in | Wt 233.0 lb

## 2017-02-02 DIAGNOSIS — E782 Mixed hyperlipidemia: Secondary | ICD-10-CM | POA: Diagnosis not present

## 2017-02-02 DIAGNOSIS — I10 Essential (primary) hypertension: Secondary | ICD-10-CM | POA: Diagnosis not present

## 2017-02-02 DIAGNOSIS — I252 Old myocardial infarction: Secondary | ICD-10-CM

## 2017-02-02 DIAGNOSIS — I25810 Atherosclerosis of coronary artery bypass graft(s) without angina pectoris: Secondary | ICD-10-CM | POA: Diagnosis not present

## 2017-02-02 MED ORDER — METOPROLOL TARTRATE 25 MG PO TABS: 25.0000 mg | ORAL_TABLET | Freq: Two times a day (BID) | ORAL | 11 refills | Status: DC

## 2017-02-02 MED ORDER — ATORVASTATIN CALCIUM 80 MG PO TABS: 40.0000 mg | ORAL_TABLET | Freq: Every day | ORAL | 11 refills | Status: DC

## 2017-02-02 MED ORDER — LISINOPRIL 10 MG PO TABS: 10.0000 mg | ORAL_TABLET | Freq: Every day | ORAL | 11 refills | Status: DC

## 2017-02-02 MED ORDER — NITROGLYCERIN 0.4 MG SL SUBL: 0.4000 mg | SUBLINGUAL_TABLET | SUBLINGUAL | 1 refills | Status: DC | PRN

## 2017-02-02 NOTE — Progress Notes (Signed)
Cardiology Office Note Date:  02/02/2017   ID:  Mario Underwood, DOB 21-Dec-1960, MRN [id number]  PCP:  Lillard Anes, MD  Cardiologist:  Sherren Mocha, MD    Chief Complaint  Patient presents with  . Follow-up    CAD     History of Present Illness: Mario Underwood is a 56 y.o. male who presents for follow-up evaluation.  The patient is followed for coronary artery disease. He presented initially in 2012 with an acute inferior wall MI and was treated with a drug-eluting stent in the right coronary artery. He had staged PCI of the proximal LAD. He's had no recurrent ischemic event since that time.  The patient is here alone today. He is doing very well and has no cardiac-related complaints. He specifically denies chest pain, chest pressure, shortness of breath, heart palpitations, lightheadedness, or leg swelling.   Past Medical History:  Diagnosis Date  . Asthma    once.week  . CAD (coronary artery disease)    A.  Inferior STEMI 12/12/10, treated with a Xience DES;  B.  Staged PCI of the LAD with a Promus DES;  C.  EF 55%, inferior AK  . Hyperlipemia   . Hypertension   . Myocardial infarction [facility]) 2012   2 stents placed  . Osteoarthritis    Right knee TKR pending with Dr. Alvan Dame    Past Surgical History:  Procedure Laterality Date  . ARTHROSCOPIC REPAIR ACL Right   . CORONARY STENT PLACEMENT  2012  . HERNIA REPAIR     left inguinal  . TOTAL KNEE ARTHROPLASTY Right 10/07/2013   Procedure: RIGHT TOTAL KNEE ARTHROPLASTY;  Surgeon: Vickey Huger, MD;  Location: [place];  Service: Orthopedics;  Laterality: Right;  . urology procedure      Current Outpatient Prescriptions  Medication Sig Dispense Refill  . albuterol (PROVENTIL HFA;VENTOLIN HFA) 108 (90 BASE) MCG/ACT inhaler Inhale 1 puff into the lungs 2 (two) times daily as needed for wheezing. 1 Inhaler 2  . aspirin EC 81 MG tablet Take 81 mg by mouth daily.    Marland Kitchen atorvastatin (LIPITOR) 80 MG tablet Take 0.5 tablets  (40 mg total) by mouth daily. 30 tablet 11  . Glucosamine-Chondroitin-MSM 500-200-150 MG TABS Take 1 tablet by mouth daily.     Marland Kitchen lisinopril (PRINIVIL,ZESTRIL) 10 MG tablet Take 1 tablet (10 mg total) by mouth daily. 30 tablet 11  . metoprolol tartrate (LOPRESSOR) 25 MG tablet Take 1 tablet (25 mg total) by mouth 2 (two) times daily. 60 tablet 11  . Multiple Vitamin (DAILY MULTIVITAMIN PO) Take 1 tablet by mouth daily.     . nitroGLYCERIN (NITROSTAT) 0.4 MG SL tablet Place 1 tablet (0.4 mg total) under the tongue every 5 (five) minutes as needed for chest pain. 25 tablet 1   No current facility-administered medications for this visit.     Allergies:   Penicillins   Social History:  The patient  reports that he has never smoked. His smokeless tobacco use includes Chew. He reports that he drinks alcohol. He reports that he does not use drugs.   Family History:  The patient's  family history includes Coronary artery disease in his sister; Heart disease in his mother.    ROS:  Please see the history of present illness.  Otherwise, review of systems is positive for knee pain.  All other systems are reviewed and negative.    PHYSICAL EXAM: VS:  BP 124/76   Pulse 82   Ht 6' (  1.829 m)   Wt 233 lb (105.7 kg)   SpO2 98%   BMI 31.60 kg/m  , BMI Body mass index is 31.6 kg/m. GEN: Well nourished, well developed, in no acute distress  HEENT: normal  Neck: no JVD, no masses. No carotid bruits Cardiac: RRR without murmur or gallop                Respiratory:  clear to auscultation bilaterally, normal work of breathing GI: soft, nontender, nondistended, + BS MS: no deformity or atrophy  Ext: no pretibial edema, pedal pulses 2+= bilaterally Skin: warm and dry, no rash Neuro:  Strength and sensation are intact Psych: euthymic mood, full affect  EKG:  EKG is ordered today. The ekg ordered today shows normal sinus rhythm 82 bpm, age-indeterminate inferior infarct, left ventricular  hypertrophy.  Recent Labs: 02/19/2016: ALT 24; BUN 12; Creat 0.98; Potassium 4.4; Sodium 139   Lipid Panel     Component Value Date/Time   CHOL 136 02/19/2016 1021   TRIG 150 (H) 02/19/2016 1021   HDL 40 02/19/2016 1021   CHOLHDL 3.4 02/19/2016 1021   VLDL 30 02/19/2016 1021   LDLCALC 66 02/19/2016 1021   LDLDIRECT  12/22/2010 0345    82 (NOTE) ATP III Classification (LDL):      < 100        mg/dL         Optimal     100 - 129     mg/dL         Near or Above Optimal     130 - 159     mg/dL         Borderline High     160 - 189     mg/dL         High      > 190        mg/dL         Very  High       Wt Readings from Last 3 Encounters:  02/02/17 233 lb (105.7 kg)  02/19/16 227 lb 12.8 oz (103.3 kg)  03/13/15 225 lb (102.1 kg)     ASSESSMENT AND PLAN: 1.  CAD, native vessel, without symptoms of angina: The patient is doing well on his current medical regimen with no functional limitation. We discussed lifestyle modification with a focus on weight loss and dietary changes.  2. Hypertension: Blood pressure is well controlled on lisinopril  3. Hyperlipidemia: The patient continues on atorvastatin 40 mg daily. His lipids will be checked today. The importance of diet, exercise, and lifestyle modification are reviewed with the patient today.  Current medicines are reviewed with the patient today.  The patient does not have concerns regarding medicines.  Labs/ tests ordered today include:   Orders Placed This Encounter  Procedures  . Lipid panel  . Comp Met (CMET)    Disposition:   FU one year  Signed, Sherren Mocha, MD  02/02/2017 5:19 PM    [address], [place], [place]  [address] Phone: [phone]; Fax: [phone]

## 2017-02-02 NOTE — Patient Instructions (Addendum)
Medication Instructions:  Your physician recommends that you continue on your current medications as directed. Please refer to the Current Medication list given to you today.  Labwork: Your physician recommends that you have lab work today: LIPID and CMP  Testing/Procedures: No new orders.   Follow-Up: Your physician wants you to follow-up in: 1 YEAR with Dr Cooper. You will receive a reminder letter in the mail two months in advance. If you don't receive a letter, please call our office to schedule the follow-up appointment.   Any Other Special Instructions Will Be Listed Below (If Applicable).     If you need a refill on your cardiac medications before your next appointment, please call your pharmacy.   

## 2017-02-03 LAB — COMPREHENSIVE METABOLIC PANEL
A/G RATIO: 2 (ref 1.2–2.2)
ALBUMIN: 4.5 g/dL (ref 3.5–5.5)
ALK PHOS: 115 IU/L (ref 39–117)
ALT: 41 IU/L (ref 0–44)
AST: 31 IU/L (ref 0–40)
BUN / CREAT RATIO: 16 (ref 9–20)
BUN: 16 mg/dL (ref 6–24)
Bilirubin Total: 0.8 mg/dL (ref 0.0–1.2)
CALCIUM: 9.8 mg/dL (ref 8.7–10.2)
CO2: 21 mmol/L (ref 20–29)
Chloride: 102 mmol/L (ref 96–106)
Creatinine, Ser: 1 mg/dL (ref 0.76–1.27)
GFR calc Af Amer: 98 mL/min/{1.73_m2} (ref >59)
GFR, EST NON AFRICAN AMERICAN: 85 mL/min/{1.73_m2} (ref >59)
GLOBULIN, TOTAL: 2.3 g/dL (ref 1.5–4.5)
Glucose: 79 mg/dL (ref 65–99)
POTASSIUM: 4.6 mmol/L (ref 3.5–5.2)
SODIUM: 141 mmol/L (ref 134–144)
Total Protein: 6.8 g/dL (ref 6.0–8.5)

## 2017-02-03 LAB — LIPID PANEL
CHOLESTEROL TOTAL: 166 mg/dL (ref 100–199)
Chol/HDL Ratio: 4.2 ratio (ref 0.0–5.0)
HDL: 40 mg/dL (ref >39)
LDL CALC: 92 mg/dL (ref 0–99)
TRIGLYCERIDES: 170 mg/dL — AB (ref 0–149)
VLDL CHOLESTEROL CAL: 34 mg/dL (ref 5–40)

## 2017-02-06 NOTE — Addendum Note (Signed)
Addended by: Etheleen MayhewOX, HEATHER C on: 02/06/2017 07:30 AM   Modules accepted: Orders

## 2017-03-16 ENCOUNTER — Other Ambulatory Visit: Payer: Self-pay | Admitting: Cardiovascular Disease

## 2017-03-16 DIAGNOSIS — I252 Old myocardial infarction: Secondary | ICD-10-CM

## 2017-03-16 DIAGNOSIS — E785 Hyperlipidemia, unspecified: Secondary | ICD-10-CM

## 2017-03-16 DIAGNOSIS — I10 Essential (primary) hypertension: Secondary | ICD-10-CM

## 2017-05-01 ENCOUNTER — Ambulatory Visit: Payer: Commercial Managed Care - PPO | Admitting: Cardiovascular Disease

## 2017-07-28 ENCOUNTER — Telehealth: Payer: Self-pay | Admitting: Cardiovascular Disease

## 2017-07-28 ENCOUNTER — Telehealth: Payer: Self-pay | Admitting: *Deleted

## 2017-07-28 NOTE — Telephone Encounter (Signed)
Walk in pt form-Clearance Dropped off placed in Triage box to be addressed.

## 2017-07-28 NOTE — Telephone Encounter (Signed)
   Primary Cardiologist: Tonny BollmanMichael Cooper, MD  Chart reviewed as part of pre-operative protocol coverage. Patient was contacted 07/28/2017 in reference to pre-operative risk assessment for pending surgery as outlined below.  Mario Underwood was last seen on 02/02/17  By Dr. Excell Seltzerooper.   Since that day, Mario Underwood has done well. He is able to walk about 25K steps every day despite significant L knee pain (scheudule for replacement surgery) No angina or dyspnea. Ok to hold ASA 81mg  5-7 days prior to surgery if needed.   Therefore, based on ACC/AHA guidelines, the patient would be at acceptable risk for the planned procedure without further cardiovascular testing.   I will route this recommendation to the requesting party via Epic fax function and remove from pre-op pool.  Please call with questions.  AhuimanuBhavinkumar Tylen Leverich, [place] 07/28/2017, 2:36 PM

## 2017-07-28 NOTE — Telephone Encounter (Signed)
   [facility] Pre-operative Risk Assessment    Request for surgical clearance:  1. What type of surgery is being performed? Major orthopaedic procedure   2. When is this surgery scheduled? Not scheduled   3. Are there any medications that need to be held prior to surgery and how long?Please indicate if there are any special instructions regarding patient's anticoagulation.   4. Practice name and name of physician performing surgery? [facility]--Dr. Ronnie Derby   5. What is your office phone and fax number? Phone- [phone]. Fax -[phone]  6. Anesthesia type (None, local, MAC, general) ? Not noted.    _________________________________________________________________   (provider comments below)  Clearance form requests patient's BMI.    Pt dropped form off in office and would like to be called once clearance completed.

## 2017-07-28 NOTE — Telephone Encounter (Signed)
What kind of procedure? Any anesthesia?

## 2017-08-01 ENCOUNTER — Telehealth: Payer: Self-pay | Admitting: Cardiovascular Disease

## 2017-08-01 NOTE — Telephone Encounter (Signed)
New Message   Victorino DikeJennifer patients wife is calling about surgical clearance form they dropped off last week. Please call.

## 2017-08-01 NOTE — Telephone Encounter (Signed)
Informed patient's wife that the clearance was faxed last Friday, but it will be faxed again. She was grateful for assistance.

## 2017-08-07 ENCOUNTER — Other Ambulatory Visit: Payer: Self-pay | Admitting: Orthopedic Surgery

## 2017-09-13 ENCOUNTER — Encounter (HOSPITAL_COMMUNITY): Payer: Self-pay

## 2017-09-13 NOTE — Pre-Procedure Instructions (Signed)
Sherie DonKenneth R Veenstra  09/13/2017      [id number] - [place], [place] - [address] [facility] [address] [address] [place] [id number] Phone: [phone] Fax: [phone]    Your procedure is scheduled on March 4.  [facility].M[facility]  Call this number if you have problems the morning of surgery:  [phone]   Remember:  Do not eat food or drink liquids after midnight.  Take these medicines the morning of surgery with A SIP OF WATER Albuterol in haler if needed, Metoprolol tartrate (Lopressor), Nitrostat if needed  Bring your inhalers with you on the day of surgery.   Stop aspirin as directed by your Dr.  Stop taking BC's, Goody's, Herbal medications, Ibuprofen, Advil, Motrin, Aleve, Vitamins, Fish Oil   Do not wear jewelry, make-up or nail polish.  Do not wear lotions, powders, or perfumes, or deodorant.  Do not shave 48 hours prior to surgery.  Men may shave face and neck.  Do not bring valuables to the hospital.  [facility] is not responsible for any belongings or valuables.  Contacts, dentures or bridgework may not be worn into surgery.  Leave your suitcase in the car.  After surgery it may be brought to your room.  For patients admitted to the hospital, discharge time will be determined by your treatment team.  Patients discharged the day of surgery will not be allowed to drive home.    Special instructions:  [place] - Preparing for Surgery  Before surgery, you can play an important role.  Because skin is not sterile, your skin needs to be as free of germs as possible.  You can reduce the number of germs on you skin by washing with CHG (chlorahexidine gluconate) soap before surgery.  CHG is an antiseptic cleaner which kills germs and bonds with the skin to continue killing germs even after washing.  Please DO NOT use if you have an allergy to CHG or antibacterial soaps.  If your skin becomes reddened/irritated  stop using the CHG and inform your nurse when you arrive at Short Stay.  Do not shave (including legs and underarms) for at least 48 hours prior to the first CHG shower.  You may shave your face.  Please follow these instructions carefully:   1.  Shower with CHG Soap the night before surgery and the                                morning of Surgery.  2.  If you choose to wash your hair, wash your hair first as usual with your       normal shampoo.  3.  After you shampoo, rinse your hair and body thoroughly to remove the                      Shampoo.  4.  Use CHG as you would any other liquid soap.  You can apply chg directly       to the skin and wash gently with scrungie or a clean washcloth.  5.  Apply the CHG Soap to your body ONLY FROM THE NECK DOWN.        Do not use on open wounds or open sores.  Avoid contact with your eyes,       ears, mouth and genitals (private parts).  Wash genitals (private parts)       with your normal soap.  6.  Wash thoroughly, paying special attention to the area where your surgery        will be performed.  7.  Thoroughly rinse your body with warm water from the neck down.  8.  DO NOT shower/wash with your normal soap after using and rinsing off       the CHG Soap.  9.  Pat yourself dry with a clean towel.            10.  Wear clean pajamas.            11.  Place clean sheets on your bed the night of your first shower and do not        sleep with pets.  Day of Surgery  Do not apply any lotions/deoderants the morning of surgery.  Please wear clean clothes to the hospital/surgery center.     Please read over the following fact sheets that you were given. Pain Booklet, Coughing and Deep Breathing, MRSA Information and Surgical Site Infection Prevention

## 2017-09-14 ENCOUNTER — Other Ambulatory Visit: Payer: Self-pay

## 2017-09-14 ENCOUNTER — Encounter (HOSPITAL_COMMUNITY)
Admission: RE | Admit: 2017-09-14 | Discharge: 2017-09-14 | Disposition: A | Discharge status: Home / Self Care | Payer: Commercial Managed Care - PPO | Source: Ambulatory Visit | Attending: Orthopedic Surgery | Admitting: Orthopedic Surgery

## 2017-09-14 ENCOUNTER — Encounter (HOSPITAL_COMMUNITY): Payer: Self-pay

## 2017-09-14 ENCOUNTER — Telehealth: Payer: Self-pay | Admitting: Cardiovascular Disease

## 2017-09-14 DIAGNOSIS — G8929 Other chronic pain: Secondary | ICD-10-CM | POA: Diagnosis present

## 2017-09-14 DIAGNOSIS — M25562 Pain in left knee: Secondary | ICD-10-CM | POA: Diagnosis present

## 2017-09-14 HISTORY — DX: Pneumonia, unspecified organism: J18.9

## 2017-09-14 LAB — COMPREHENSIVE METABOLIC PANEL
ALT: 23 U/L (ref 17–63)
ANION GAP: 10 (ref 5–15)
AST: 24 U/L (ref 15–41)
Albumin: 4 g/dL (ref 3.5–5.0)
Alkaline Phosphatase: 98 U/L (ref 38–126)
BUN: 14 mg/dL (ref 6–20)
CHLORIDE: 107 mmol/L (ref 101–111)
CO2: 21 mmol/L — AB (ref 22–32)
Calcium: 9.2 mg/dL (ref 8.9–10.3)
Creatinine, Ser: 0.86 mg/dL (ref 0.61–1.24)
GFR calc non Af Amer: >60 mL/min (ref >60)
Glucose, Bld: 95 mg/dL (ref 65–99)
Potassium: 4.4 mmol/L (ref 3.5–5.1)
SODIUM: 138 mmol/L (ref 135–145)
Total Bilirubin: 0.8 mg/dL (ref 0.3–1.2)
Total Protein: 6.8 g/dL (ref 6.5–8.1)

## 2017-09-14 LAB — CBC WITH DIFFERENTIAL/PLATELET
Basophils Absolute: 0 10*3/uL (ref 0.0–0.1)
Basophils Relative: 0 %
EOS ABS: 0.1 10*3/uL (ref 0.0–0.7)
EOS PCT: 1 %
HCT: 46.7 % (ref 39.0–52.0)
Hemoglobin: 15.3 g/dL (ref 13.0–17.0)
LYMPHS ABS: 2.3 10*3/uL (ref 0.7–4.0)
Lymphocytes Relative: 18 %
MCH: 30.6 pg (ref 26.0–34.0)
MCHC: 32.8 g/dL (ref 30.0–36.0)
MCV: 93.4 fL (ref 78.0–100.0)
Monocytes Absolute: 1 10*3/uL (ref 0.1–1.0)
Monocytes Relative: 8 %
Neutro Abs: 9.8 10*3/uL — ABNORMAL HIGH (ref 1.7–7.7)
Neutrophils Relative %: 73 %
PLATELETS: 228 10*3/uL (ref 150–400)
RBC: 5 MIL/uL (ref 4.22–5.81)
RDW: 13.8 % (ref 11.5–15.5)
WBC: 13.2 10*3/uL — AB (ref 4.0–10.5)

## 2017-09-14 LAB — SURGICAL PCR SCREEN
MRSA, PCR: NEGATIVE
STAPHYLOCOCCUS AUREUS: NEGATIVE

## 2017-09-14 NOTE — Progress Notes (Addendum)
States he goes to urgent care for his PCP Cardiologist is Dr. Excell Seltzerooper Denies cough, fever, or chest pain. Stress test noted 2013 Card cath 2012 Pt states he will call dr Cooper's office to see when to stop taking aspirin.

## 2017-09-14 NOTE — Telephone Encounter (Signed)
New message   Pt's wife Victorino DikeJennifer is calling about the pt surgical clearance, says they recvd instructions for his metoprolol but wants to know if he should stop his aspirin 81 mg 1 week prior or the night before. Please call Says ok to leave message

## 2017-09-14 NOTE — Telephone Encounter (Signed)
Per 1/4 phone encounter, "Ok to hold ASA 81mg  5-7 days prior to surgery if needed." Reviewed with patient's wife. She was grateful for call and agrees with treatment plan.

## 2017-09-15 NOTE — Progress Notes (Signed)
Anesthesia Chart Review:  Pt is a 57 year old male scheduled for L total knee arthroplasty on 3/4 2019 with Dannielle HuhSteve Lucey, MD  - PCP is Brent BullaLawrence Perry, MD - Cardiologist is Tonny BollmanMichael Cooper, MD. Last office visit 02/02/17; 1 year f/u recommended. Pt cleared for surgery 07/28/17 by Manson PasseyBhavinkumar Bhagat, PA.   PMH includes:  CAD (MI, DES to RCA and to LAD 2012), HTN, hyperlipidemia, asthma. Never smoker. Current smokeless tobacco user. BMI 32.5. S/p R TKA 10/07/13  Medications include: albuterol, ASA 81mg , lipitor, lisinopril, metoprolol. Pt to hold ASA 5-7 days before surgery.   BP 137/77   Pulse 75   Temp 36.8 C   Resp 20   Ht 6' (1.829 m)   Wt 239 lb 1.6 oz (108.5 kg)   SpO2 99%   BMI 32.43 kg/m   Preoperative labs reviewed.    EKG 02/02/17: NSR. Inferior infarct, age undetermined. LVH.   Nuclear stress test 11/15/11:  - Abnormal stress nuclear study with small, moderate intensity, fixed inferobasal and apical defects consistent with prior inferobasal infarct and apical thinning; no ischemia. - LV Ejection Fraction: 50%.  LV Wall Motion:  Inferobasal hypokinesis.  Cardiac cath 12/21/10:  1. RCA: 50% proximal, diffuse midportion plaquing, distal total occlusion. Gives off PDA and at least 4 posterolateral branches. S/p DES to RCA 2. LM patent 3. LAD: high diagonal/ramus intermedius branch with 50-60% proximal stenosis. D2 40% stenosis. Beyond D2, LAD has 80% eccentric long segment stenosis. S/p DES to LAD on 12/23/10.  4. CX: small distribution. Two OM branches with minor diffuse plaque.   If no changes, I anticipate pt can proceed with surgery as scheduled.   Rica Mastngela Mychele Seyller, FNP-BC [facility] Phone: [phone] 09/15/2017 2:52 PM

## 2017-09-22 MED ORDER — BUPIVACAINE LIPOSOME 1.3 % IJ SUSP
20.0000 mL | INTRAMUSCULAR | Status: AC
  Administered 2017-09-25: 20 mL
  Filled 2017-09-22: qty 20

## 2017-09-22 MED ORDER — CLINDAMYCIN PHOSPHATE 900 MG/50ML IV SOLN
900.0000 mg | INTRAVENOUS | Status: AC
  Administered 2017-09-25: 900 mg via INTRAVENOUS
  Filled 2017-09-22: qty 50

## 2017-09-22 MED ORDER — GABAPENTIN 300 MG PO CAPS
300.0000 mg | ORAL_CAPSULE | Freq: Once | ORAL | Status: AC
  Administered 2017-09-25: 300 mg via ORAL
  Filled 2017-09-22: qty 1

## 2017-09-22 MED ORDER — DEXAMETHASONE SODIUM PHOSPHATE 10 MG/ML IJ SOLN
8.0000 mg | Freq: Once | INTRAMUSCULAR | Status: AC
  Administered 2017-09-25: 8 mg via INTRAVENOUS
  Filled 2017-09-22: qty 1

## 2017-09-22 MED ORDER — ACETAMINOPHEN 500 MG PO TABS
1000.0000 mg | ORAL_TABLET | Freq: Once | ORAL | Status: AC
  Administered 2017-09-25: 1000 mg via ORAL
  Filled 2017-09-22: qty 2

## 2017-09-22 MED ORDER — TRANEXAMIC ACID 1000 MG/10ML IV SOLN
1000.0000 mg | INTRAVENOUS | Status: AC
  Administered 2017-09-25: 1000 mg via INTRAVENOUS
  Filled 2017-09-22: qty 1100

## 2017-09-24 NOTE — Anesthesia Preprocedure Evaluation (Addendum)
Anesthesia Evaluation  Patient identified by MRN, date of birth, ID band Patient awake    Reviewed: Allergy & Precautions, H&P , NPO status , Patient's Chart, lab work & pertinent test results, reviewed documented beta blocker date and time   History of Anesthesia Complications Negative for: history of anesthetic complications  Airway Mallampati: II  TM Distance: >3 FB Neck ROM: Full    Dental  (+) Dental Advisory Given, Teeth Intact   Pulmonary asthma ,    breath sounds clear to auscultation       Cardiovascular Exercise Tolerance: Good hypertension, Pt. on home beta blockers and Pt. on medications + CAD, + Past MI and + Cardiac Stents   Rhythm:Regular Rate:Normal  Rest stress myoview - 11/15/11 - Abnormal stress nuclear study with small, moderate intensity, fixed inferobasal and apical defects consistent with prior inferobasal infarct and apical thinning; no ischemia.  LV Ejection Fraction: 50%.  LV Wall Motion:  Inferobasal hypokinesis.    Neuro/Psych negative neurological ROS  negative psych ROS   GI/Hepatic negative GI ROS, Neg liver ROS,   Endo/Other  Obesity  Renal/GU negative Renal ROS     Musculoskeletal  (+) Arthritis ,   Abdominal   Peds  Hematology negative hematology ROS (+)   Anesthesia Other Findings Anaphylaxis to PCN  Reproductive/Obstetrics                            Anesthesia Physical  Anesthesia Plan  ASA: III  Anesthesia Plan: Spinal   Post-op Pain Management:  Regional for Post-op pain   Induction:   PONV Risk Score and Plan: 1 and Treatment may vary due to age or medical condition and Propofol infusion  Airway Management Planned: Simple Face Mask and Natural Airway  Additional Equipment: None  Intra-op Plan:   Post-operative Plan:   Informed Consent: I have reviewed the patients History and Physical, chart, labs and discussed the procedure including  the risks, benefits and alternatives for the proposed anesthesia with the patient or authorized representative who has indicated his/her understanding and acceptance.   Dental advisory given and Dental Advisory Given  Plan Discussed with: CRNA and Anesthesiologist  Anesthesia Plan Comments:        Anesthesia Quick Evaluation

## 2017-09-25 ENCOUNTER — Encounter (HOSPITAL_COMMUNITY): Payer: Self-pay

## 2017-09-25 ENCOUNTER — Ambulatory Visit (HOSPITAL_COMMUNITY): Payer: Commercial Managed Care - PPO | Admitting: Anesthesiology

## 2017-09-25 ENCOUNTER — Observation Stay (HOSPITAL_COMMUNITY)
Admission: RE | Admit: 2017-09-25 | Discharge: 2017-09-26 | Disposition: A | Discharge status: Home / Self Care | Payer: Commercial Managed Care - PPO | Source: Ambulatory Visit | Attending: Orthopedic Surgery | Admitting: Orthopedic Surgery

## 2017-09-25 ENCOUNTER — Ambulatory Visit (HOSPITAL_COMMUNITY): Payer: Commercial Managed Care - PPO | Admitting: Emergency Medicine

## 2017-09-25 ENCOUNTER — Encounter (HOSPITAL_COMMUNITY)
Admission: RE | Disposition: A | Discharge status: Home / Self Care | Payer: Self-pay | Source: Ambulatory Visit | Attending: Orthopedic Surgery

## 2017-09-25 DIAGNOSIS — M1712 Unilateral primary osteoarthritis, left knee: Principal | ICD-10-CM | POA: Insufficient documentation

## 2017-09-25 DIAGNOSIS — I252 Old myocardial infarction: Secondary | ICD-10-CM | POA: Insufficient documentation

## 2017-09-25 DIAGNOSIS — E785 Hyperlipidemia, unspecified: Secondary | ICD-10-CM | POA: Diagnosis not present

## 2017-09-25 DIAGNOSIS — J45909 Unspecified asthma, uncomplicated: Secondary | ICD-10-CM | POA: Insufficient documentation

## 2017-09-25 DIAGNOSIS — Z79899 Other long term (current) drug therapy: Secondary | ICD-10-CM | POA: Diagnosis not present

## 2017-09-25 DIAGNOSIS — Z7982 Long term (current) use of aspirin: Secondary | ICD-10-CM | POA: Diagnosis not present

## 2017-09-25 DIAGNOSIS — I2510 Atherosclerotic heart disease of native coronary artery without angina pectoris: Secondary | ICD-10-CM | POA: Diagnosis not present

## 2017-09-25 DIAGNOSIS — Z880 Allergy status to penicillin: Secondary | ICD-10-CM | POA: Insufficient documentation

## 2017-09-25 DIAGNOSIS — Z96659 Presence of unspecified artificial knee joint: Secondary | ICD-10-CM

## 2017-09-25 DIAGNOSIS — Z955 Presence of coronary angioplasty implant and graft: Secondary | ICD-10-CM | POA: Diagnosis not present

## 2017-09-25 DIAGNOSIS — Z8249 Family history of ischemic heart disease and other diseases of the circulatory system: Secondary | ICD-10-CM | POA: Diagnosis not present

## 2017-09-25 DIAGNOSIS — I10 Essential (primary) hypertension: Secondary | ICD-10-CM | POA: Diagnosis not present

## 2017-09-25 HISTORY — PX: TOTAL KNEE ARTHROPLASTY: SHX125

## 2017-09-25 SURGERY — ARTHROPLASTY, KNEE, TOTAL
Anesthesia: Spinal | Site: Knee | Laterality: Left

## 2017-09-25 MED ORDER — PHENYLEPHRINE HCL 10 MG/ML IJ SOLN
INTRAMUSCULAR | Status: DC | PRN
  Administered 2017-09-25: 50 ug/min via INTRAVENOUS

## 2017-09-25 MED ORDER — ATORVASTATIN CALCIUM 40 MG PO TABS
40.0000 mg | ORAL_TABLET | Freq: Every day | ORAL | Status: DC
  Administered 2017-09-25: 40 mg via ORAL
  Filled 2017-09-25: qty 1

## 2017-09-25 MED ORDER — ZOLPIDEM TARTRATE 5 MG PO TABS: 5.0000 mg | ORAL_TABLET | Freq: Every evening | ORAL | Status: DC | PRN

## 2017-09-25 MED ORDER — LISINOPRIL 10 MG PO TABS
10.0000 mg | ORAL_TABLET | Freq: Every day | ORAL | Status: DC
  Administered 2017-09-26: 10 mg via ORAL
  Filled 2017-09-25: qty 1

## 2017-09-25 MED ORDER — PROPOFOL 10 MG/ML IV BOLUS
INTRAVENOUS | Status: DC | PRN
  Administered 2017-09-25 (×2): 20 mg via INTRAVENOUS

## 2017-09-25 MED ORDER — FLEET ENEMA 7-19 GM/118ML RE ENEM: 1.0000 | ENEMA | Freq: Once | RECTAL | Status: DC | PRN

## 2017-09-25 MED ORDER — FENTANYL CITRATE (PF) 250 MCG/5ML IJ SOLN
INTRAMUSCULAR | Status: AC
  Filled 2017-09-25: qty 5

## 2017-09-25 MED ORDER — SODIUM CHLORIDE 0.9 % IJ SOLN
INTRAMUSCULAR | Status: DC | PRN
  Administered 2017-09-25: 20 mL

## 2017-09-25 MED ORDER — 0.9 % SODIUM CHLORIDE (POUR BTL) OPTIME
TOPICAL | Status: DC | PRN
  Administered 2017-09-25: 1000 mL

## 2017-09-25 MED ORDER — LACTATED RINGERS IV SOLN
INTRAVENOUS | Status: DC | PRN
  Administered 2017-09-25 (×2): via INTRAVENOUS

## 2017-09-25 MED ORDER — SENNOSIDES-DOCUSATE SODIUM 8.6-50 MG PO TABS: 1.0000 | ORAL_TABLET | Freq: Every evening | ORAL | Status: DC | PRN

## 2017-09-25 MED ORDER — ACETAMINOPHEN 500 MG PO TABS
1000.0000 mg | ORAL_TABLET | Freq: Four times a day (QID) | ORAL | Status: AC
  Administered 2017-09-25 – 2017-09-26 (×4): 1000 mg via ORAL
  Filled 2017-09-25 (×4): qty 2

## 2017-09-25 MED ORDER — TRANEXAMIC ACID 1000 MG/10ML IV SOLN
1000.0000 mg | Freq: Once | INTRAVENOUS | Status: AC
  Administered 2017-09-25: 1000 mg via INTRAVENOUS
  Filled 2017-09-25 (×2): qty 10

## 2017-09-25 MED ORDER — CLINDAMYCIN PHOSPHATE 600 MG/50ML IV SOLN
600.0000 mg | Freq: Four times a day (QID) | INTRAVENOUS | Status: AC
  Administered 2017-09-25 (×2): 600 mg via INTRAVENOUS
  Filled 2017-09-25 (×2): qty 50

## 2017-09-25 MED ORDER — BISACODYL 5 MG PO TBEC: 5.0000 mg | DELAYED_RELEASE_TABLET | Freq: Every day | ORAL | Status: DC | PRN

## 2017-09-25 MED ORDER — PROPOFOL 500 MG/50ML IV EMUL
INTRAVENOUS | Status: DC | PRN
  Administered 2017-09-25: 75 ug/kg/min via INTRAVENOUS

## 2017-09-25 MED ORDER — HYDROMORPHONE HCL 1 MG/ML IJ SOLN: 0.5000 mg | INTRAMUSCULAR | Status: DC | PRN

## 2017-09-25 MED ORDER — METOPROLOL TARTRATE 25 MG PO TABS
25.0000 mg | ORAL_TABLET | Freq: Two times a day (BID) | ORAL | Status: DC
  Administered 2017-09-25 – 2017-09-26 (×2): 25 mg via ORAL
  Filled 2017-09-25 (×2): qty 1

## 2017-09-25 MED ORDER — SODIUM CHLORIDE 0.9 % IR SOLN
Status: DC | PRN
  Administered 2017-09-25: 3000 mL

## 2017-09-25 MED ORDER — METHOCARBAMOL 1000 MG/10ML IJ SOLN
500.0000 mg | Freq: Four times a day (QID) | INTRAVENOUS | Status: DC | PRN
  Filled 2017-09-25: qty 5

## 2017-09-25 MED ORDER — CHLORHEXIDINE GLUCONATE 4 % EX LIQD: 60.0000 mL | Freq: Once | CUTANEOUS | Status: DC

## 2017-09-25 MED ORDER — BUPIVACAINE-EPINEPHRINE (PF) 0.5% -1:200000 IJ SOLN
INTRAMUSCULAR | Status: DC | PRN
  Administered 2017-09-25: 30 mL via PERINEURAL

## 2017-09-25 MED ORDER — DIPHENHYDRAMINE HCL 12.5 MG/5ML PO ELIX: 12.5000 mg | ORAL_SOLUTION | ORAL | Status: DC | PRN

## 2017-09-25 MED ORDER — BUPIVACAINE-EPINEPHRINE (PF) 0.5% -1:200000 IJ SOLN
INTRAMUSCULAR | Status: AC
  Filled 2017-09-25: qty 30

## 2017-09-25 MED ORDER — MENTHOL 3 MG MT LOZG: 1.0000 | LOZENGE | OROMUCOSAL | Status: DC | PRN

## 2017-09-25 MED ORDER — NITROGLYCERIN 0.4 MG SL SUBL: 0.4000 mg | SUBLINGUAL_TABLET | SUBLINGUAL | Status: DC | PRN

## 2017-09-25 MED ORDER — ALUM & MAG HYDROXIDE-SIMETH 200-200-20 MG/5ML PO SUSP: 30.0000 mL | ORAL | Status: DC | PRN

## 2017-09-25 MED ORDER — PHENOL 1.4 % MT LIQD: 1.0000 | OROMUCOSAL | Status: DC | PRN

## 2017-09-25 MED ORDER — ONDANSETRON HCL 4 MG/2ML IJ SOLN: 4.0000 mg | Freq: Four times a day (QID) | INTRAMUSCULAR | Status: DC | PRN

## 2017-09-25 MED ORDER — BUPIVACAINE-EPINEPHRINE (PF) 0.25% -1:200000 IJ SOLN
INTRAMUSCULAR | Status: DC | PRN
  Administered 2017-09-25: 30 mL via PERINEURAL

## 2017-09-25 MED ORDER — DEXAMETHASONE SODIUM PHOSPHATE 10 MG/ML IJ SOLN
10.0000 mg | Freq: Once | INTRAMUSCULAR | Status: AC
  Administered 2017-09-26: 10 mg via INTRAVENOUS
  Filled 2017-09-25: qty 1

## 2017-09-25 MED ORDER — METHOCARBAMOL 500 MG PO TABS
500.0000 mg | ORAL_TABLET | Freq: Four times a day (QID) | ORAL | Status: DC | PRN
  Administered 2017-09-25 – 2017-09-26 (×3): 500 mg via ORAL
  Filled 2017-09-25 (×5): qty 1

## 2017-09-25 MED ORDER — GABAPENTIN 300 MG PO CAPS
300.0000 mg | ORAL_CAPSULE | Freq: Three times a day (TID) | ORAL | Status: DC
  Administered 2017-09-25 – 2017-09-26 (×4): 300 mg via ORAL
  Filled 2017-09-25 (×4): qty 1

## 2017-09-25 MED ORDER — ALBUTEROL SULFATE (2.5 MG/3ML) 0.083% IN NEBU: 3.0000 mL | INHALATION_SOLUTION | Freq: Two times a day (BID) | RESPIRATORY_TRACT | Status: DC | PRN

## 2017-09-25 MED ORDER — METOCLOPRAMIDE HCL 5 MG/ML IJ SOLN: 5.0000 mg | Freq: Three times a day (TID) | INTRAMUSCULAR | Status: DC | PRN

## 2017-09-25 MED ORDER — ASPIRIN EC 325 MG PO TBEC
325.0000 mg | DELAYED_RELEASE_TABLET | Freq: Two times a day (BID) | ORAL | Status: DC
  Administered 2017-09-26: 325 mg via ORAL
  Filled 2017-09-25 (×2): qty 1

## 2017-09-25 MED ORDER — BUPIVACAINE IN DEXTROSE 0.75-8.25 % IT SOLN
INTRATHECAL | Status: DC | PRN
  Administered 2017-09-25: 2 mL via INTRATHECAL

## 2017-09-25 MED ORDER — ONDANSETRON HCL 4 MG PO TABS: 4.0000 mg | ORAL_TABLET | Freq: Four times a day (QID) | ORAL | Status: DC | PRN

## 2017-09-25 MED ORDER — FENTANYL CITRATE (PF) 100 MCG/2ML IJ SOLN
INTRAMUSCULAR | Status: DC | PRN
  Administered 2017-09-25: 50 ug via INTRAVENOUS
  Administered 2017-09-25: 25 ug via INTRAVENOUS

## 2017-09-25 MED ORDER — DOCUSATE SODIUM 100 MG PO CAPS
100.0000 mg | ORAL_CAPSULE | Freq: Two times a day (BID) | ORAL | Status: DC
  Administered 2017-09-25 – 2017-09-26 (×2): 100 mg via ORAL
  Filled 2017-09-25 (×2): qty 1

## 2017-09-25 MED ORDER — LIDOCAINE HCL (CARDIAC) 20 MG/ML IV SOLN
INTRAVENOUS | Status: DC | PRN
  Administered 2017-09-25: 20 mg via INTRATRACHEAL

## 2017-09-25 MED ORDER — TRAMADOL HCL 50 MG PO TABS
50.0000 mg | ORAL_TABLET | Freq: Four times a day (QID) | ORAL | Status: DC
  Administered 2017-09-25 – 2017-09-26 (×5): 50 mg via ORAL
  Filled 2017-09-25 (×5): qty 1

## 2017-09-25 MED ORDER — METOCLOPRAMIDE HCL 5 MG PO TABS: 5.0000 mg | ORAL_TABLET | Freq: Three times a day (TID) | ORAL | Status: DC | PRN

## 2017-09-25 MED ORDER — MIDAZOLAM HCL 2 MG/2ML IJ SOLN
INTRAMUSCULAR | Status: AC
  Filled 2017-09-25: qty 2

## 2017-09-25 MED ORDER — PANTOPRAZOLE SODIUM 40 MG PO TBEC
40.0000 mg | DELAYED_RELEASE_TABLET | Freq: Every day | ORAL | Status: DC
  Administered 2017-09-25 – 2017-09-26 (×2): 40 mg via ORAL
  Filled 2017-09-25 (×2): qty 1

## 2017-09-25 MED ORDER — MIDAZOLAM HCL 5 MG/5ML IJ SOLN
INTRAMUSCULAR | Status: DC | PRN
  Administered 2017-09-25: 2 mg via INTRAVENOUS

## 2017-09-25 MED ORDER — OXYCODONE HCL 5 MG PO TABS
5.0000 mg | ORAL_TABLET | ORAL | Status: DC | PRN
  Administered 2017-09-25 – 2017-09-26 (×4): 10 mg via ORAL
  Filled 2017-09-25 (×4): qty 2

## 2017-09-25 SURGICAL SUPPLY — 70 items
BANDAGE ACE 4X5 VEL STRL LF (GAUZE/BANDAGES/DRESSINGS) ×3 IMPLANT
BANDAGE ACE 6X5 VEL STRL LF (GAUZE/BANDAGES/DRESSINGS) ×3 IMPLANT
BANDAGE ESMARK 6X9 LF (GAUZE/BANDAGES/DRESSINGS) ×1 IMPLANT
BLADE SAGITTAL 13X1.27X60 (BLADE) ×2 IMPLANT
BLADE SAGITTAL 13X1.27X60MM (BLADE) ×1
BLADE SAW SGTL 83.5X18.5 (BLADE) ×3 IMPLANT
BLADE SURG 10 STRL SS (BLADE) ×3 IMPLANT
BNDG ESMARK 6X9 LF (GAUZE/BANDAGES/DRESSINGS) ×3
BOWL SMART MIX CTS (DISPOSABLE) ×3 IMPLANT
CEMENT BONE SIMPLEX SPEEDSET (Cement) ×6 IMPLANT
CLOSURE STERI-STRIP 1/2X4 (GAUZE/BANDAGES/DRESSINGS) ×1
CLOSURE WOUND 1/2 X4 (GAUZE/BANDAGES/DRESSINGS) ×1
CLSR STERI-STRIP ANTIMIC 1/2X4 (GAUZE/BANDAGES/DRESSINGS) ×2 IMPLANT
COMP POLY PATELLA 35 DIA 9.0 (Joint) ×3 IMPLANT
COMPONENT PLY PTLLA 35 DIA 9.0 (Joint) ×1 IMPLANT
COVER SURGICAL LIGHT HANDLE (MISCELLANEOUS) ×3 IMPLANT
CUFF TOURNIQUET SINGLE 34IN LL (TOURNIQUET CUFF) ×3 IMPLANT
DRAPE EXTREMITY T 121X128X90 (DRAPE) ×3 IMPLANT
DRAPE HALF SHEET 40X57 (DRAPES) ×3 IMPLANT
DRAPE INCISE IOBAN 66X45 STRL (DRAPES) ×6 IMPLANT
DRAPE U-SHAPE 47X51 STRL (DRAPES) ×3 IMPLANT
DRSG AQUACEL AG ADV 3.5X 6 (GAUZE/BANDAGES/DRESSINGS) ×3 IMPLANT
DRSG AQUACEL AG ADV 3.5X10 (GAUZE/BANDAGES/DRESSINGS) ×3 IMPLANT
DURAPREP 26ML APPLICATOR (WOUND CARE) ×6 IMPLANT
ELECT REM PT RETURN 9FT ADLT (ELECTROSURGICAL) ×3
ELECTRODE REM PT RTRN 9FT ADLT (ELECTROSURGICAL) ×1 IMPLANT
FEMUR  CMT CCR STD SZ9 L KNEE (Knees) ×2 IMPLANT
FEMUR CMT CCR STD SZ9 L KNEE (Knees) ×1 IMPLANT
FEMUR CMTD CCR STD SZ9 L KNEE (Knees) ×1 IMPLANT
GLOVE BIOGEL M 7.0 STRL (GLOVE) IMPLANT
GLOVE BIOGEL PI IND STRL 7.5 (GLOVE) IMPLANT
GLOVE BIOGEL PI IND STRL 8.5 (GLOVE) ×1 IMPLANT
GLOVE BIOGEL PI INDICATOR 7.5 (GLOVE)
GLOVE BIOGEL PI INDICATOR 8.5 (GLOVE) ×2
GLOVE SURG ORTHO 8.0 STRL STRW (GLOVE) ×6 IMPLANT
GOWN STRL REUS W/ TWL LRG LVL3 (GOWN DISPOSABLE) ×1 IMPLANT
GOWN STRL REUS W/ TWL XL LVL3 (GOWN DISPOSABLE) ×2 IMPLANT
GOWN STRL REUS W/TWL 2XL LVL3 (GOWN DISPOSABLE) ×3 IMPLANT
GOWN STRL REUS W/TWL LRG LVL3 (GOWN DISPOSABLE) ×2
GOWN STRL REUS W/TWL XL LVL3 (GOWN DISPOSABLE) ×4
HANDPIECE INTERPULSE COAX TIP (DISPOSABLE) ×2
HOOD PEEL AWAY FACE SHEILD DIS (HOOD) ×9 IMPLANT
INSERT TIBIAL SZ EF/6-9 18 LT (Insert) ×3 IMPLANT
KIT BASIN OR (CUSTOM PROCEDURE TRAY) ×3 IMPLANT
KIT ROOM TURNOVER OR (KITS) ×3 IMPLANT
MANIFOLD NEPTUNE II (INSTRUMENTS) ×3 IMPLANT
NEEDLE 18GX1X1/2 (RX/OR ONLY) (NEEDLE) IMPLANT
NEEDLE 22X1 1/2 (OR ONLY) (NEEDLE) ×6 IMPLANT
NS IRRIG 1000ML POUR BTL (IV SOLUTION) ×3 IMPLANT
PACK TOTAL JOINT (CUSTOM PROCEDURE TRAY) ×3 IMPLANT
PAD ARMBOARD 7.5X6 YLW CONV (MISCELLANEOUS) ×6 IMPLANT
SET HNDPC FAN SPRY TIP SCT (DISPOSABLE) ×1 IMPLANT
STEM TIBIA 5 DEG SZ F L KNEE (Knees) ×1 IMPLANT
STRIP CLOSURE SKIN 1/2X4 (GAUZE/BANDAGES/DRESSINGS) ×2 IMPLANT
SUCTION FRAZIER HANDLE 10FR (MISCELLANEOUS)
SUCTION TUBE FRAZIER 10FR DISP (MISCELLANEOUS) IMPLANT
SUT BONE WAX W31G (SUTURE) ×3 IMPLANT
SUT MNCRL AB 3-0 PS2 18 (SUTURE) ×3 IMPLANT
SUT VIC AB 0 CTB1 27 (SUTURE) ×3 IMPLANT
SUT VIC AB 1 CT1 27 (SUTURE) ×4
SUT VIC AB 1 CT1 27XBRD ANBCTR (SUTURE) ×2 IMPLANT
SUT VIC AB 2-0 CT1 27 (SUTURE) ×4
SUT VIC AB 2-0 CT1 TAPERPNT 27 (SUTURE) ×2 IMPLANT
SUT VLOC 180 0 24IN GS25 (SUTURE) ×3 IMPLANT
SYR 20CC LL (SYRINGE) ×6 IMPLANT
TIBIA STEM 5 DEG SZ F L KNEE (Knees) ×3 IMPLANT
TOWEL OR 17X24 6PK STRL BLUE (TOWEL DISPOSABLE) ×3 IMPLANT
TOWEL OR 17X26 10 PK STRL BLUE (TOWEL DISPOSABLE) ×3 IMPLANT
TRAY CATH 16FR W/PLASTIC CATH (SET/KITS/TRAYS/PACK) IMPLANT
WRAP KNEE MAXI GEL POST OP (GAUZE/BANDAGES/DRESSINGS) ×3 IMPLANT

## 2017-09-25 NOTE — Progress Notes (Addendum)
Patient arrived to 5n24, alert and oriented from PACU. Report given via telephone from Maunal[place]Mark, [place]. IV Infusing, scd's on, CPM machine functioning to left knee, noted to have knee wrapped in ace wrap that is clean/dry/intact.Reports no pain at the moment. Wife at bedside, oriented to room and staff, will continue to monitor.

## 2017-09-25 NOTE — Evaluation (Signed)
Physical Therapy Evaluation Patient Details Name: Mario Underwood MRN: [id number] DOB: 09/14/1960 Today's Date: 09/25/2017   History of Present Illness  Pt is a 57 y/o male s/p elective L TKA. PMH inlucdes CAD s/p stent placement, STEMI, HTN, asthma, and R TKA.   Clinical Impression  Pt is s/p surgery above with deficits below. Pt tolerated gait well and required min to min guard A for mobility with RW. Supine HEP initiated and reviewed knee precautions. Will continue to follow acutely to maximize functional mobility independence and safety.     Follow Up Recommendations Follow surgeon's recommendation for DC plan and follow-up therapies;Supervision for mobility/OOB    Equipment Recommendations  None recommended by PT    Recommendations for Other Services       Precautions / Restrictions Precautions Precautions: Knee Precaution Booklet Issued: Yes (comment) Precaution Comments: Reviewed knee precautions with pt.  Restrictions Weight Bearing Restrictions: Yes LLE Weight Bearing: Weight bearing as tolerated      Mobility  Bed Mobility Overal bed mobility: Needs Assistance Bed Mobility: Sit to Supine       Sit to supine: Min assist   General bed mobility comments: Min A for LLE lift assist for return to supine.   Transfers Overall transfer level: Needs assistance Equipment used: Rolling walker (2 wheeled) Transfers: Sit to/from Stand Sit to Stand: Min assist         General transfer comment: Min A for lift assist and steadying assist. Demonstrated safe hand placement.   Ambulation/Gait Ambulation/Gait assistance: Min assist;Min guard Ambulation Distance (Feet): 75 Feet Assistive device: Rolling walker (2 wheeled) Gait Pattern/deviations: Step-to pattern;Decreased step length - right;Decreased step length - left;Decreased weight shift to left;Antalgic Gait velocity: Decreased  Gait velocity interpretation: Below normal speed for age/gender General Gait Details:  Slow, antalgic gait. Demonstrating decreased weightshift to LLE. Verbal cues for sequencing using RW.   Stairs            Wheelchair Mobility    Modified Rankin (Stroke Patients Only)       Balance Overall balance assessment: Needs assistance Sitting-balance support: No upper extremity supported;Feet supported Sitting balance-Leahy Scale: Good     Standing balance support: Bilateral upper extremity supported;During functional activity Standing balance-Leahy Scale: Poor Standing balance comment: Reliant on BUE support                              Pertinent Vitals/Pain Pain Assessment: 0-10 Pain Score: 8  Pain Location: L knee  Pain Descriptors / Indicators: Aching;Operative site guarding Pain Intervention(s): Limited activity within patient's tolerance;Monitored during session;Repositioned;Patient requesting pain meds-RN notified    Home Living Family/patient expects to be discharged to:: Private residence Living Arrangements: Spouse/significant other Available Help at Discharge: Family;Available 24 hours/day Type of Home: House Home Access: Stairs to enter Entrance Stairs-Rails: Right Entrance Stairs-Number of Steps: 2 Home Layout: One level Home Equipment: Shower seat - built in;Grab bars - tub/shower;Walker - 2 wheels;Bedside commode;Cane - single point      Prior Function Level of Independence: Independent               Hand Dominance   Dominant Hand: Right    Extremity/Trunk Assessment   Upper Extremity Assessment Upper Extremity Assessment: Overall WFL for tasks assessed    Lower Extremity Assessment Lower Extremity Assessment: LLE deficits/detail LLE Deficits / Details: Reports some decreased sensation throughout LLE. Deficits consistent with post op pain and weakness. Able to perform ther  ex below.     Cervical / Trunk Assessment Cervical / Trunk Assessment: Normal  Communication   Communication: No difficulties  Cognition  Arousal/Alertness: Awake/alert Behavior During Therapy: WFL for tasks assessed/performed Overall Cognitive Status: Within Functional Limits for tasks assessed                                        General Comments General comments (skin integrity, edema, etc.): Pt's wife present during session.     Exercises Total Joint Exercises Ankle Circles/Pumps: AROM;Both;20 reps Quad Sets: AROM;Left;10 reps Towel Squeeze: AROM;Both;10 reps Heel Slides: AROM;Left;10 reps   Assessment/Plan    PT Assessment Patient needs continued PT services  PT Problem List Decreased strength;Decreased range of motion;Decreased balance;Decreased mobility;Decreased knowledge of use of DME;Decreased knowledge of precautions;Pain       PT Treatment Interventions DME instruction;Gait training;Stair training;Functional mobility training;Therapeutic activities;Therapeutic exercise;Balance training;Neuromuscular re-education;Patient/family education    PT Goals (Current goals can be found in the Care Plan section)  Acute Rehab PT Goals Patient Stated Goal: to go home tomorrow  PT Goal Formulation: With patient Time For Goal Achievement: 10/09/17 Potential to Achieve Goals: Good    Frequency 7X/week   Barriers to discharge        Co-evaluation               AM-PAC PT "6 Clicks" Daily Activity  Outcome Measure Difficulty turning over in bed (including adjusting bedclothes, sheets and blankets)?: A Little Difficulty moving from lying on back to sitting on the side of the bed? : Unable Difficulty sitting down on and standing up from a chair with arms (e.g., wheelchair, bedside commode, etc,.)?: Unable Help needed moving to and from a bed to chair (including a wheelchair)?: A Little Help needed walking in hospital room?: A Little Help needed climbing 3-5 steps with a railing? : A Little 6 Click Score: 14    End of Session Equipment Utilized During Treatment: Gait belt Activity  Tolerance: Patient tolerated treatment well Patient left: in bed;with call bell/phone within reach;with family/visitor present Nurse Communication: Mobility status;Patient requests pain meds PT Visit Diagnosis: Other abnormalities of gait and mobility (R26.89);Pain Pain - Right/Left: Left Pain - part of body: Knee    Time: 1610-9604 PT Time Calculation (min) (ACUTE ONLY): 34 min   Charges:   PT Evaluation $PT Eval Low Complexity: 1 Low PT Treatments $Gait Training: 8-22 mins   PT G Codes:        Gladys Damme, PT, DPT  Acute Rehabilitation Services  Pager: [phone]   Lehman Prom 09/25/2017, 1:49 PM

## 2017-09-25 NOTE — Transfer of Care (Signed)
Immediate Anesthesia Transfer of Care Note  Patient: DMARIO RUSSOM  Procedure(s) Performed: LEFT TOTAL KNEE ARTHROPLASTY (Left Knee)  Patient Location: PACU  Anesthesia Type:MAC and Spinal  Level of Consciousness: awake, alert  and oriented  Airway & Oxygen Therapy: Patient Spontanous Breathing  Post-op Assessment: Report given to RN, Post -op Vital signs reviewed and stable and Patient moving all extremities X 4  Post vital signs: Reviewed and stable  Last Vitals:  Vitals:   09/25/17 0542  BP: (!) 148/93  Pulse: 69  Resp: 20  Temp: 36.9 C  SpO2: 97%    Last Pain:  Vitals:   09/25/17 0551  TempSrc:   PainSc: 5       Patients Stated Pain Goal: 2 (28/41/32 4401)  Complications: No apparent anesthesia complications

## 2017-09-25 NOTE — Progress Notes (Signed)
Orthopedic Tech Progress Note Patient Details:  Mario Underwood 01/05/1961 [id number]  CPM Left Knee CPM Left Knee: On Left Knee Flexion (Degrees): 90 Left Knee Extension (Degrees): 0 Additional Comments: foot roll  Post Interventions Patient Tolerated: Well Instructions Provided: Care of device, Adjustment of device  Saul FordyceJennifer C Reigan Underwood 09/25/2017, 9:46 AM

## 2017-09-25 NOTE — Anesthesia Procedure Notes (Signed)
Anesthesia Regional Block: Adductor canal block   Pre-Anesthetic Checklist: ,, timeout performed, Correct Patient, Correct Site, Correct Laterality, Correct Procedure, Correct Position, site marked, Risks and benefits discussed,  Surgical consent,  Pre-op evaluation,  At surgeon's request and post-op pain management  Laterality: Left  Prep: chloraprep       Needles:  Injection technique: Single-shot  Needle Type: Echogenic Needle     Needle Length: 10cm  Needle Gauge: 21     Additional Needles:   Narrative:  Start time: 09/25/2017 6:58 AM End time: 09/25/2017 7:01 AM Injection made incrementally with aspirations every 5 mL.  Performed by: Personally  Anesthesiologist: Beryle LatheBrock, Thomas E, MD  Additional Notes: No pain on injection. No increased resistance to injection. Injection made in 5cc increments. Good needle visualization. Patient tolerated the procedure well.

## 2017-09-25 NOTE — H&P (Signed)
Mario Underwood MRN:  [id number] DOB/SEX:  02/18/1961/male  CHIEF COMPLAINT:  Painful left Knee  HISTORY: Patient is a 57 y.o. male presented with a history of pain in the left knee. Onset of symptoms was gradual starting a few years ago with gradually worsening course since that time. Patient has been treated conservatively with over-the-counter NSAIDs and activity modification. Patient currently rates pain in the knee at 10 out of 10 with activity. There is pain at night.  PAST MEDICAL HISTORY: Patient Active Problem List   Diagnosis Date Noted  . 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 History of acute inferior wall MI 01/06/2011  . CAD (coronary artery disease)   . Hypertension   . Hyperlipemia   . Osteoarthritis    Past Medical History:  Diagnosis Date  . Asthma    once.week  . CAD (coronary artery disease)    A.  Inferior STEMI 12/12/10, treated with a Xience DES;  B.  Staged PCI of the LAD with a Promus DES;  C.  EF 55%, inferior AK  . Hyperlipemia   . Hypertension   . Myocardial infarction [facility]) 2012   2 stents placed  . Osteoarthritis    Right knee TKR pending with Dr. Charlann Boxerlin  . Pneumonia    Past Surgical History:  Procedure Laterality Date  . ARTHROSCOPIC REPAIR ACL Right   . CORONARY STENT PLACEMENT  2012  . HERNIA REPAIR     left inguinal  . TOTAL KNEE ARTHROPLASTY Right 10/07/2013   Procedure: RIGHT TOTAL KNEE ARTHROPLASTY;  Surgeon: Dannielle HuhSteve Lucey, MD;  Location: [facility] OR;  Service: Orthopedics;  Laterality: Right;  . urology procedure       MEDICATIONS:   Medications Prior to Admission  Medication Sig Dispense Refill Last Dose  . aspirin EC 81 MG tablet Take 81 mg by mouth daily.   09/18/2017  . atorvastatin (LIPITOR) 80 MG tablet Take 0.5 tablets (40 mg total) by mouth daily. 30 tablet 11 09/24/2017 at Unknown time  . Glucosamine-Chondroitin-MSM 500-200-150 MG TABS Take 1 tablet by mouth daily.    09/18/2017  . ibuprofen (ADVIL,MOTRIN) 200 MG tablet Take 400 mg by  mouth every 6 (six) hours as needed for headache or moderate pain.   Past Month at Unknown time  . lisinopril (PRINIVIL,ZESTRIL) 10 MG tablet Take 1 tablet (10 mg total) by mouth daily. 30 tablet 11 09/25/2017 at 0415  . metoprolol tartrate (LOPRESSOR) 25 MG tablet Take 1 tablet (25 mg total) by mouth 2 (two) times daily. 60 tablet 11 09/25/2017 at 0415  . Multiple Vitamin (DAILY MULTIVITAMIN PO) Take 1 tablet by mouth daily.    09/18/2017  . nitroGLYCERIN (NITROSTAT) 0.4 MG SL tablet Place 1 tablet (0.4 mg total) under the tongue every 5 (five) minutes as needed for chest pain. 25 tablet 1   . Omega-3 Fatty Acids (FISH OIL PO) Take 1 capsule by mouth daily.   09/18/2017  . albuterol (PROVENTIL HFA;VENTOLIN HFA) 108 (90 BASE) MCG/ACT inhaler Inhale 1 puff into the lungs 2 (two) times daily as needed for wheezing. 1 Inhaler 2 More than a month at Unknown time    ALLERGIES:   Allergies  Allergen Reactions  . Penicillins Hives, Swelling, Rash and Other (See Comments)    SWELLING REACTION UNSPECIFIED  PATIENT HAS HAD A PCN REACTION WITH IMMEDIATE RASH, FACIAL/TONGUE/THROAT SWELLING, SOB, OR LIGHTHEADEDNESS WITH HYPOTENSION:  #  #  #  YES  #  #  #   Has patient had a  PCN reaction causing severe rash involving mucus membranes or skin necrosis: No Had a PCN reaction that required hospitalization:  #  #  #  NO , but Needed to go to [facility] OFFICE # # # Has patient had a PCN reaction occurring within the last 10 years: No      REVIEW OF SYSTEMS:  A comprehensive review of systems was negative except for: Musculoskeletal: positive for arthralgias and bone pain   FAMILY HISTORY:   Family History  Problem Relation Age of Onset  . Coronary artery disease Sister   . Heart disease Mother     SOCIAL HISTORY:   Social History   Tobacco Use  . Smoking status: Never Smoker  . Smokeless tobacco: Current User    Types: Chew  Substance Use Topics  . Alcohol use: Yes    Comment: weekends      EXAMINATION:  Vital signs in last 24 hours: Temp:  [98.5 F (36.9 C)] 98.5 F (36.9 C) (03/04 0542) Pulse Rate:  [69] 69 (03/04 0542) Resp:  [20] 20 (03/04 0542) BP: (148)/(93) 148/93 (03/04 0542) SpO2:  [97 %] 97 % (03/04 0542) Weight:  [108.4 kg (239 lb)-108.5 kg (239 lb 1.6 oz)] 108.4 kg (239 lb) (03/04 0542)  BP (!) 148/93   Pulse 69   Temp 98.5 F (36.9 C) (Oral)   Resp 20   Ht 6' (1.829 m)   Wt 108.4 kg (239 lb)   SpO2 97%   BMI 32.41 kg/m   General Appearance:    Alert, cooperative, no distress, appears stated age  Head:    Normocephalic, without obvious abnormality, atraumatic  Eyes:    PERRL, conjunctiva/corneas clear, EOM's intact, fundi    benign, both eyes       Ears:    Normal TM's and external ear canals, both ears  Nose:   Nares normal, septum midline, mucosa normal, no drainage    or sinus tenderness  Throat:   Lips, mucosa, and tongue normal; teeth and gums normal  Neck:   Supple, symmetrical, trachea midline, no adenopathy;       thyroid:  No enlargement/tenderness/nodules; no carotid   bruit or JVD  Back:     Symmetric, no curvature, ROM normal, no CVA tenderness  Lungs:     Clear to auscultation bilaterally, respirations unlabored  Chest wall:    No tenderness or deformity  Heart:    Regular rate and rhythm, S1 and S2 normal, no murmur, rub   or gallop  Abdomen:     Soft, non-tender, bowel sounds active all four quadrants,    no masses, no organomegaly  Genitalia:    Normal male without lesion, discharge or tenderness  Rectal:    Normal tone, normal prostate, no masses or tenderness;   guaiac negative stool  Extremities:   Extremities normal, atraumatic, no cyanosis or edema  Pulses:   2+ and symmetric all extremities  Skin:   Skin color, texture, turgor normal, no rashes or lesions  Lymph nodes:   Cervical, supraclavicular, and axillary nodes normal  Neurologic:   CNII-XII intact. Normal strength, sensation and reflexes      throughout     Musculoskeletal:  ROM 0-120, Ligaments intact,  Imaging Review Plain radiographs demonstrate severe degenerative joint disease of the left knee. The overall alignment is neutral. The bone quality appears to be excellent for age and reported activity level.  Assessment/Plan: Primary osteoarthritis, left knee   The patient history, physical examination and imaging studies  are consistent with advanced degenerative joint disease of the left knee. The patient has failed conservative treatment.  The clearance notes were reviewed.  After discussion with the patient it was felt that Total Knee Replacement was indicated. The procedure,  risks, and benefits of total knee arthroplasty were presented and reviewed. The risks including but not limited to aseptic loosening, infection, blood clots, vascular injury, stiffness, patella tracking problems complications among others were discussed. The patient acknowledged the explanation, agreed to proceed with the plan.  Guy Sandifer 09/25/2017, 6:16 AM

## 2017-09-25 NOTE — Anesthesia Procedure Notes (Signed)
Spinal  Patient location during procedure: OR Start time: 09/25/2017 7:39 AM End time: 09/25/2017 7:43 AM Staffing Anesthesiologist: Beryle LatheBrock, Thomas E, MD Performed: anesthesiologist  Preanesthetic Checklist Completed: patient identified, surgical consent, pre-op evaluation, timeout performed, IV checked, risks and benefits discussed and monitors and equipment checked Spinal Block Patient position: sitting Prep: DuraPrep Patient monitoring: heart rate, cardiac monitor, continuous pulse ox and blood pressure Approach: midline Location: L3-4 Injection technique: single-shot Needle Needle type: Pencan  Needle gauge: 24 G Additional Notes Functioning IV was confirmed and monitors were applied. Sterile prep and drape, including hand hygiene, mask, and sterile gloves were used. The patient was positioned and the spine was prepped. The skin was anesthetized with lidocaine. Free flow of clear CSF was obtained prior to injecting local anesthetic into the CSF. The spinal needle aspirated freely following injection. The needle was carefully withdrawn. The patient tolerated the procedure well. Consent was obtained prior to the procedure with all questions answered and concerns addressed. Risks including, but not limited to, bleeding, infection, nerve damage, paralysis, failed block, inadequate analgesia, allergic reaction, high spinal, itching, and headache were discussed and the patient wished to proceed.  Leslye Peerhomas Brock, MD

## 2017-09-25 NOTE — Anesthesia Postprocedure Evaluation (Signed)
Anesthesia Post Note  Patient: Mario Underwood  Procedure(s) Performed: LEFT TOTAL KNEE ARTHROPLASTY (Left Knee)     Patient location during evaluation: PACU Anesthesia Type: Spinal Level of consciousness: awake and alert Pain management: pain level controlled Vital Signs Assessment: post-procedure vital signs reviewed and stable Respiratory status: spontaneous breathing and respiratory function stable Cardiovascular status: blood pressure returned to baseline and stable Postop Assessment: spinal receding and no apparent nausea or vomiting Anesthetic complications: no    Last Vitals:  Vitals:   09/25/17 0953 09/25/17 0957  BP: 117/85   Pulse: 67   Resp: 18   Temp:    SpO2: 99% 95%    Last Pain:  Vitals:   09/25/17 0957  TempSrc:   PainSc: 0-No pain    LLE Motor Response: Purposeful movement (09/25/17 0957)   RLE Motor Response: Purposeful movement (09/25/17 0957)   L Sensory Level: L4-Anterior knee, lower leg (09/25/17 0957) R Sensory Level: S1-Sole of foot, small toes (09/25/17 0957)  Beryle Lathehomas E Tait Balistreri

## 2017-09-26 DIAGNOSIS — M1712 Unilateral primary osteoarthritis, left knee: Secondary | ICD-10-CM | POA: Diagnosis not present

## 2017-09-26 LAB — BASIC METABOLIC PANEL
Anion gap: 10 (ref 5–15)
BUN: 15 mg/dL (ref 6–20)
CHLORIDE: 104 mmol/L (ref 101–111)
CO2: 23 mmol/L (ref 22–32)
CREATININE: 1 mg/dL (ref 0.61–1.24)
Calcium: 9.1 mg/dL (ref 8.9–10.3)
Glucose, Bld: 113 mg/dL — ABNORMAL HIGH (ref 65–99)
POTASSIUM: 4.7 mmol/L (ref 3.5–5.1)
Sodium: 137 mmol/L (ref 135–145)

## 2017-09-26 LAB — CBC
HCT: 44.3 % (ref 39.0–52.0)
HEMOGLOBIN: 14.3 g/dL (ref 13.0–17.0)
MCH: 30.6 pg (ref 26.0–34.0)
MCHC: 32.3 g/dL (ref 30.0–36.0)
MCV: 94.9 fL (ref 78.0–100.0)
Platelets: 231 10*3/uL (ref 150–400)
RBC: 4.67 MIL/uL (ref 4.22–5.81)
RDW: 14.4 % (ref 11.5–15.5)
WBC: 16.8 10*3/uL — ABNORMAL HIGH (ref 4.0–10.5)

## 2017-09-26 LAB — GLUCOSE, CAPILLARY: GLUCOSE-CAPILLARY: 118 mg/dL — AB (ref 65–99)

## 2017-09-26 MED ORDER — OXYCODONE HCL 10 MG PO TABS: 10.0000 mg | ORAL_TABLET | ORAL | 0 refills | Status: DC | PRN

## 2017-09-26 MED ORDER — METHOCARBAMOL 500 MG PO TABS: 500.0000 mg | ORAL_TABLET | Freq: Four times a day (QID) | ORAL | 0 refills | Status: DC | PRN

## 2017-09-26 MED ORDER — ASPIRIN 325 MG PO TBEC: 325.0000 mg | DELAYED_RELEASE_TABLET | Freq: Two times a day (BID) | ORAL | 0 refills | Status: DC

## 2017-09-26 NOTE — Progress Notes (Signed)
SPORTS MEDICINE AND JOINT REPLACEMENT  Mario SpurlingStephen Lucey, MD    Mario Nancyolby Donell Tomkins, PA-C [address], [place], [place]  [id number]                             [phone]   PROGRESS NOTE  Subjective:  negative for Chest Pain  negative for Shortness of Breath  negative for Nausea/Vomiting   negative for Calf Pain  negative for Bowel Movement   Tolerating Diet: yes         Patient reports pain as 4 on 0-10 scale.    Objective: Vital signs in last 24 hours:    Patient Vitals for the past 24 hrs:  BP Temp Temp src Pulse Resp SpO2  09/26/17 0545 121/83 98.4 F (36.9 C) Oral 71 19 99 %  09/26/17 0024 118/75 98.6 F (37 C) Oral (!) 57 16 98 %  09/25/17 1900 129/82 98.9 F (37.2 C) Oral 87 16 95 %  09/25/17 1300 125/78 98 F (36.7 C) Oral 80 16 99 %  09/25/17 1020 - (!) 97 F (36.1 C) - (!) 49 16 99 %  09/25/17 1015 - - - (!) 58 18 94 %  09/25/17 1000 - - - (!) 48 18 (!) 87 %  09/25/17 0957 - - - - - 95 %  09/25/17 0953 117/85 - - 67 18 99 %  09/25/17 0945 - - - (!) 59 15 95 %  09/25/17 0922 107/68 97.7 F (36.5 C) - 65 19 98 %    @flow {1959:LAST@   Intake/Output from previous day:   03/04 0701 - 03/05 0700 In: 1933.6 [P.O.:600; I.V.:1173.6] Out: 2050 [Urine:2000]   Intake/Output this shift:   03/04 1901 - 03/05 0700 In: -  Out: 1000 [Urine:1000]   Intake/Output      03/04 0701 - 03/05 0700   P.O. 600   I.V. (mL/kg) 1173.6 (10.8)   IV Piggyback 160   Total Intake(mL/kg) 1933.6 (17.8)   Urine (mL/kg/hr) 2000 (0.8)   Blood 50   Total Output 2050   Net -116.4          LABORATORY DATA: Recent Labs    09/26/17 0519  WBC 16.8*  HGB 14.3  HCT 44.3  PLT 231   No results for input(s): NA, K, CL, CO2, BUN, CREATININE, GLUCOSE, CALCIUM in the last 168 hours. Lab Results  Component Value Date   INR 1.08 09/30/2013   INR 1.01 12/23/2010   INR (HH) 12/20/2010    6.93 REPEATED TO VERIFY SPECIMEN CHECKED FOR CLOTS CRITICAL RESULT CALLED TO, READ BACK BY AND  VERIFIED WITH: T TOMLINSON,RN 12/20/10 AT 2127 BY J HICKS    Examination:  General appearance: alert, cooperative and no distress Extremities: extremities normal, atraumatic, no cyanosis or edema  Wound Exam: clean, dry, intact   Drainage:  None: wound tissue dry  Motor Exam: Quadriceps and Hamstrings Intact  Sensory Exam: Superficial Peroneal, Deep Peroneal and Tibial normal   Assessment:    1 Day Post-Op  Procedure(s) (LRB): LEFT TOTAL KNEE ARTHROPLASTY (Left)  ADDITIONAL DIAGNOSIS:  Active Problems:   S/P total knee replacement    Plan: Physical Therapy as ordered Weight Bearing as Tolerated (WBAT)  DVT Prophylaxis:  Aspirin  DISCHARGE PLAN: Home  DISCHARGE NEEDS: HHPT   Patient doing well, expected D/C home today         Mario SandiferColby Alan Zephaniah Underwood 09/26/2017, 6:59 AM

## 2017-09-26 NOTE — Progress Notes (Signed)
Discharge instructions, RX's and follow up appts explained and provided to patient verbalized understanding. Home health and equipment set up for patient. Patient left floor via wheelchair no c/o pain.  Samaj Wessells, Kae HellerMiranda Lynn, RN

## 2017-09-26 NOTE — Plan of Care (Signed)
  Completed/Met Education: Knowledge of General Education information will improve 09/26/2017 1610 - Completed/Met by Emmaline Life, RN Health Behavior/Discharge Planning: Ability to manage health-related needs will improve 09/26/2017 1610 - Completed/Met by Emmaline Life, RN Clinical Measurements: Ability to maintain clinical measurements within normal limits will improve 09/26/2017 1610 - Completed/Met by Emmaline Life, RN Will remain free from infection 09/26/2017 1610 - Completed/Met by Emmaline Life, RN Diagnostic test results will improve 09/26/2017 1610 - Completed/Met by Emmaline Life, RN Respiratory complications will improve 09/26/2017 1610 - Completed/Met by Emmaline Life, RN Cardiovascular complication will be avoided 09/26/2017 1610 - Completed/Met by Emmaline Life, RN Activity: Risk for activity intolerance will decrease 09/26/2017 1610 - Completed/Met by Emmaline Life, RN Nutrition: Adequate nutrition will be maintained 09/26/2017 1610 - Completed/Met by Emmaline Life, RN Coping: Level of anxiety will decrease 09/26/2017 1610 - Completed/Met by Emmaline Life, RN Elimination: Will not experience complications related to bowel motility 09/26/2017 1610 - Completed/Met by Emmaline Life, RN Will not experience complications related to urinary retention 09/26/2017 1610 - Completed/Met by Emmaline Life, RN Pain Managment: General experience of comfort will improve 09/26/2017 1610 - Completed/Met by Emmaline Life, RN Safety: Ability to remain free from injury will improve 09/26/2017 1610 - Completed/Met by Emmaline Life, RN Skin Integrity: Risk for impaired skin integrity will decrease 09/26/2017 1610 - Completed/Met by Emmaline Life, RN Clinical Measurements: Postoperative complications will be avoided or minimized 09/26/2017 1610 - Completed/Met by Emmaline Life, RN Skin Integrity: Demonstration of wound healing without infection  will improve 09/26/2017 1610 - Completed/Met by Emmaline Life, RN

## 2017-09-26 NOTE — Progress Notes (Signed)
Physical Therapy Treatment Patient Details Name: Mario Underwood MRN: [id number] DOB: [birth year] Today's Date: 09/26/2017    History of Present Illness Pt is a 57 y/o male s/p elective L TKA. PMH inlucdes CAD s/p stent placement, STEMI, HTN, asthma, and R TKA.     PT Comments    Patient is making good progress with PT.  From a mobility standpoint anticipate patient will be ready for DC home when medically ready.    Follow Up Recommendations  Follow surgeon's recommendation for DC plan and follow-up therapies;Supervision for mobility/OOB     Equipment Recommendations  None recommended by PT    Recommendations for Other Services       Precautions / Restrictions Precautions Precautions: Knee Precaution Comments: knee precautions reviewed with pt Restrictions Weight Bearing Restrictions: Yes LLE Weight Bearing: Weight bearing as tolerated    Mobility  Bed Mobility Overal bed mobility: Needs Assistance Bed Mobility: Supine to Sit     Supine to sit: Supervision Sit to supine: Supervision   General bed mobility comments: supervision for safety  Transfers Overall transfer level: Needs assistance Equipment used: Rolling walker (2 wheeled) Transfers: Sit to/from Stand Sit to Stand: Min guard         General transfer comment: cues for safe hand placement  Ambulation/Gait Ambulation/Gait assistance: Supervision Ambulation Distance (Feet): 200 Feet Assistive device: Rolling walker (2 wheeled) Gait Pattern/deviations: Decreased weight shift to left;Step-through pattern;Decreased stance time - left;Antalgic Gait velocity: Decreased    General Gait Details: cues for L quad activation during stance phase, L heel strike, and posture   Stairs Stairs: Yes   Stair Management: One rail Right;Step to pattern;Forwards Number of Stairs: 3 General stair comments: cues for sequencing and technique   Wheelchair Mobility    Modified Rankin (Stroke Patients Only)        Balance Overall balance assessment: Needs assistance Sitting-balance support: No upper extremity supported;Feet supported Sitting balance-Leahy Scale: Good     Standing balance support: Bilateral upper extremity supported;During functional activity Standing balance-Leahy Scale: Fair                              Cognition Arousal/Alertness: Awake/alert Behavior During Therapy: WFL for tasks assessed/performed Overall Cognitive Status: Within Functional Limits for tasks assessed                                        Exercises Total Joint Exercises Quad Sets: AROM;Left;10 reps Short Arc Quad: AROM;Left;10 reps Heel Slides: AROM;Left;10 reps Hip ABduction/ADduction: AROM;Left;10 reps Straight Leg Raises: AROM;Left;10 reps Long Arc Quad: AROM;Left;10 reps Knee Flexion: AROM;Left;10 reps;Seated;Other (comment)(10 sec holds) Goniometric ROM: 0-90    General Comments        Pertinent Vitals/Pain Pain Assessment: Faces Faces Pain Scale: Hurts little more(6 with therex) Pain Location: L thigh and posterior L knee Pain Descriptors / Indicators: Guarding;Sore;Grimacing Pain Intervention(s): Limited activity within patient's tolerance;Monitored during session;Repositioned;RN gave pain meds during session    Home Living                      Prior Function            PT Goals (current goals can now be found in the care plan section) Acute Rehab PT Goals Patient Stated Goal: to go home tomorrow  PT Goal Formulation: With patient Time For  Goal Achievement: 10/09/17 Potential to Achieve Goals: Good Progress towards PT goals: Progressing toward goals    Frequency    7X/week      PT Plan Current plan remains appropriate    Co-evaluation              AM-PAC PT "6 Clicks" Daily Activity  Outcome Measure  Difficulty turning over in bed (including adjusting bedclothes, sheets and blankets)?: None Difficulty moving from lying on  back to sitting on the side of the bed? : A Little Difficulty sitting down on and standing up from a chair with arms (e.g., wheelchair, bedside commode, etc,.)?: Unable Help needed moving to and from a bed to chair (including a wheelchair)?: A Little Help needed walking in hospital room?: A Little Help needed climbing 3-5 steps with a railing? : A Little 6 Click Score: 17    End of Session Equipment Utilized During Treatment: Gait belt Activity Tolerance: Patient tolerated treatment well Patient left: in bed;with call bell/phone within reach;in CPM;with family/visitor present Nurse Communication: Mobility status PT Visit Diagnosis: Other abnormalities of gait and mobility (R26.89);Pain Pain - Right/Left: Left Pain - part of body: Knee     Time: 1136-1209 PT Time Calculation (min) (ACUTE ONLY): 33 min  Charges:  $Gait Training: 8-22 mins $Therapeutic Exercise: 8-22 mins                    G Codes:       Erline LevineKellyn Latandra Loureiro, PTA Pager: [phone]     Carolynne EdouardKellyn R Raistlin Gum 09/26/2017, 1:13 PM

## 2017-09-26 NOTE — Discharge Summary (Signed)
SPORTS MEDICINE & JOINT REPLACEMENT   Georgena Spurling, MD   Laurier Nancy, PA-C [address] [place], [place], [place]  [address]                             [phone]  PATIENT ID: Mario Underwood        MRN:  [id number]          DOB/AGE: 02-13-1961 / 57 y.o.    DISCHARGE SUMMARY  ADMISSION DATE:    09/25/2017 DISCHARGE DATE:   09/26/2017   ADMISSION DIAGNOSIS: primary osteoarthritis left knee    DISCHARGE DIAGNOSIS:  primary osteoarthritis left knee    ADDITIONAL DIAGNOSIS: Active Problems:   S/P total knee replacement  Past Medical History:  Diagnosis Date  . Asthma    once.week  . CAD (coronary artery disease)    A.  Inferior STEMI 12/12/10, treated with a Xience DES;  B.  Staged PCI of the LAD with a Promus DES;  C.  EF 55%, inferior AK  . Hyperlipemia   . Hypertension   . Myocardial infarction [facility]) 2012   2 stents placed  . Osteoarthritis    Right knee TKR pending with Dr. Charlann Boxer  . Pneumonia     PROCEDURE: Procedure(s): LEFT TOTAL KNEE ARTHROPLASTY on 09/25/2017  CONSULTS:    HISTORY:  See H&P in chart  HOSPITAL COURSE:  Mario Underwood is a 57 y.o. admitted on 09/25/2017 and found to have a diagnosis of primary osteoarthritis left knee.  After appropriate laboratory studies were obtained  they were taken to the operating room on 09/25/2017 and underwent Procedure(s): LEFT TOTAL KNEE ARTHROPLASTY.   They were given perioperative antibiotics:  Anti-infectives (From admission, onward)   Start     Dose/Rate Route Frequency Ordered Stop   09/25/17 1330  clindamycin (CLEOCIN) IVPB 600 mg     600 mg 100 mL/hr over 30 Minutes Intravenous Every 6 hours 09/25/17 1032 09/25/17 1925   09/25/17 0600  clindamycin (CLEOCIN) IVPB 900 mg     900 mg 100 mL/hr over 30 Minutes Intravenous To ShortStay Surgical 09/22/17 1144 09/25/17 0801    .  Patient given tranexamic acid IV or topical and exparel intra-operatively.  Tolerated the procedure well.    POD# 1: Vital signs were  stable.  Patient denied Chest pain, shortness of breath, or calf pain.  Patient was started on Lovenox 30 mg subcutaneously twice daily at 8am.  Consults to PT, OT, and care management were made.  The patient was weight bearing as tolerated.  CPM was placed on the operative leg 0-90 degrees for 6-8 hours a day. When out of the CPM, patient was placed in the foam block to achieve full extension. Incentive spirometry was taught.  Dressing was changed.       POD #2, Continued  PT for ambulation and exercise program.  IV saline locked.  O2 discontinued.    The remainder of the hospital course was dedicated to ambulation and strengthening.   The patient was discharged on 1 Day Post-Op in  Good condition.  Blood products given:none  DIAGNOSTIC STUDIES: Recent vital signs:  Patient Vitals for the past 24 hrs:  BP Temp Temp src Pulse Resp SpO2  09/26/17 0925 126/85 - - 73 - -  09/26/17 0545 121/83 98.4 F (36.9 C) Oral 71 19 99 %  09/26/17 0024 118/75 98.6 F (37 C) Oral (!) 57 16 98 %  09/25/17 1900  129/82 98.9 F (37.2 C) Oral 87 16 95 %  09/25/17 1300 125/78 98 F (36.7 C) Oral 80 16 99 %       Recent laboratory studies: Recent Labs    09/26/17 0519  WBC 16.8*  HGB 14.3  HCT 44.3  PLT 231   Recent Labs    09/26/17 0519  NA 137  K 4.7  CL 104  CO2 23  BUN 15  CREATININE 1.00  GLUCOSE 113*  CALCIUM 9.1   Lab Results  Component Value Date   INR 1.08 09/30/2013   INR 1.01 12/23/2010   INR (HH) 12/20/2010    6.93 REPEATED TO VERIFY SPECIMEN CHECKED FOR CLOTS CRITICAL RESULT CALLED TO, READ BACK BY AND VERIFIED WITH: T TOMLINSON,RN 12/20/10 AT 2127 BY J HICKS     Recent Radiographic Studies :  No results found.  DISCHARGE INSTRUCTIONS: Discharge Instructions    CPM   Complete by:  As directed    Continuous passive motion machine (CPM):      Use the CPM from 0 to 90 for 4-6 hours per day.      You may increase by 10 per day.  You may break it up into 2 or 3 sessions  per day.      Use CPM for 2 weeks or until you are told to stop.   Call MD / Call 911   Complete by:  As directed    If you experience chest pain or shortness of breath, CALL 911 and be transported to the hospital emergency room.  If you develope a fever above 101 F, pus (white drainage) or increased drainage or redness at the wound, or calf pain, call your surgeon's office.   Constipation Prevention   Complete by:  As directed    Drink plenty of fluids.  Prune juice may be helpful.  You may use a stool softener, such as Colace (over the counter) 100 mg twice a day.  Use MiraLax (over the counter) for constipation as needed.   Diet - low sodium heart healthy   Complete by:  As directed    Discharge instructions   Complete by:  As directed    INSTRUCTIONS AFTER JOINT REPLACEMENT   Remove items at home which could result in a fall. This includes throw rugs or furniture in walking pathways ICE to the affected joint every three hours while awake for 30 minutes at a time, for at least the first 3-5 days, and then as needed for pain and swelling.  Continue to use ice for pain and swelling. You may notice swelling that will progress down to the foot and ankle.  This is normal after surgery.  Elevate your leg when you are not up walking on it.   Continue to use the breathing machine you got in the hospital (incentive spirometer) which will help keep your temperature down.  It is common for your temperature to cycle up and down following surgery, especially at night when you are not up moving around and exerting yourself.  The breathing machine keeps your lungs expanded and your temperature down.   DIET:  As you were doing prior to hospitalization, we recommend a well-balanced diet.  DRESSING / WOUND CARE / SHOWERING  Keep the surgical dressing until follow up.  The dressing is water proof, so you can shower without any extra covering.  IF THE DRESSING FALLS OFF or the wound gets wet inside, change the  dressing with sterile gauze.  Please  use good hand washing techniques before changing the dressing.  Do not use any lotions or creams on the incision until instructed by your surgeon.    ACTIVITY  Increase activity slowly as tolerated, but follow the weight bearing instructions below.   No driving for 6 weeks or until further direction given by your physician.  You cannot drive while taking narcotics.  No lifting or carrying greater than 10 lbs. until further directed by your surgeon. Avoid periods of inactivity such as sitting longer than an hour when not asleep. This helps prevent blood clots.  You may return to work once you are authorized by your doctor.     WEIGHT BEARING   Weight bearing as tolerated with assist device (walker, cane, etc) as directed, use it as long as suggested by your surgeon or therapist, typically at least 4-6 weeks.   EXERCISES  Results after joint replacement surgery are often greatly improved when you follow the exercise, range of motion and muscle strengthening exercises prescribed by your doctor. Safety measures are also important to protect the joint from further injury. Any time any of these exercises cause you to have increased pain or swelling, decrease what you are doing until you are comfortable again and then slowly increase them. If you have problems or questions, call your caregiver or physical therapist for advice.   Rehabilitation is important following a joint replacement. After just a few days of immobilization, the muscles of the leg can become weakened and shrink (atrophy).  These exercises are designed to build up the tone and strength of the thigh and leg muscles and to improve motion. Often times heat used for twenty to thirty minutes before working out will loosen up your tissues and help with improving the range of motion but do not use heat for the first two weeks following surgery (sometimes heat can increase post-operative swelling).    These exercises can be done on a training (exercise) mat, on the floor, on a table or on a bed. Use whatever works the best and is most comfortable for you.    Use music or television while you are exercising so that the exercises are a pleasant break in your day. This will make your life better with the exercises acting as a break in your routine that you can look forward to.   Perform all exercises about fifteen times, three times per day or as directed.  You should exercise both the operative leg and the other leg as well.   Exercises include:   Quad Sets - Tighten up the muscle on the front of the thigh (Quad) and hold for 5-10 seconds.   Straight Leg Raises - With your knee straight (if you were given a brace, keep it on), lift the leg to 60 degrees, hold for 3 seconds, and slowly lower the leg.  Perform this exercise against resistance later as your leg gets stronger.  Leg Slides: Lying on your back, slowly slide your foot toward your buttocks, bending your knee up off the floor (only go as far as is comfortable). Then slowly slide your foot back down until your leg is flat on the floor again.  Angel Wings: Lying on your back spread your legs to the side as far apart as you can without causing discomfort.  Hamstring Strength:  Lying on your back, push your heel against the floor with your leg straight by tightening up the muscles of your buttocks.  Repeat, but this time bend your  knee to a comfortable angle, and push your heel against the floor.  You may put a pillow under the heel to make it more comfortable if necessary.   A rehabilitation program following joint replacement surgery can speed recovery and prevent re-injury in the future due to weakened muscles. Contact your doctor or a physical therapist for more information on knee rehabilitation.    CONSTIPATION  Constipation is defined medically as fewer than three stools per week and severe constipation as less than one stool per week.   Even if you have a regular bowel pattern at home, your normal regimen is likely to be disrupted due to multiple reasons following surgery.  Combination of anesthesia, postoperative narcotics, change in appetite and fluid intake all can affect your bowels.   YOU MUST use at least one of the following options; they are listed in order of increasing strength to get the job done.  They are all available over the counter, and you may need to use some, POSSIBLY even all of these options:    Drink plenty of fluids (prune juice may be helpful) and high fiber foods Colace 100 mg by mouth twice a day  Senokot for constipation as directed and as needed Dulcolax (bisacodyl), take with full glass of water  Miralax (polyethylene glycol) once or twice a day as needed.  If you have tried all these things and are unable to have a bowel movement in the first 3-4 days after surgery call either your surgeon or your primary doctor.    If you experience loose stools or diarrhea, hold the medications until you stool forms back up.  If your symptoms do not get better within 1 week or if they get worse, check with your doctor.  If you experience "the worst abdominal pain ever" or develop nausea or vomiting, please contact the office immediately for further recommendations for treatment.   ITCHING:  If you experience itching with your medications, try taking only a single pain pill, or even half a pain pill at a time.  You can also use Benadryl over the counter for itching or also to help with sleep.   TED HOSE STOCKINGS:  Use stockings on both legs until for at least 2 weeks or as directed by physician office. They may be removed at night for sleeping.  MEDICATIONS:  See your medication summary on the "After Visit Summary" that nursing will review with you.  You may have some home medications which will be placed on hold until you complete the course of blood thinner medication.  It is important for you to complete the  blood thinner medication as prescribed.  PRECAUTIONS:  If you experience chest pain or shortness of breath - call 911 immediately for transfer to the hospital emergency department.   If you develop a fever greater that 101 F, purulent drainage from wound, increased redness or drainage from wound, foul odor from the wound/dressing, or calf pain - CONTACT YOUR SURGEON.                                                   FOLLOW-UP APPOINTMENTS:  If you do not already have a post-op appointment, please call the office for an appointment to be seen by your surgeon.  Guidelines for how soon to be seen are listed in your "After Visit Summary",  but are typically between 1-4 weeks after surgery.  OTHER INSTRUCTIONS:   Knee Replacement:  Do not place pillow under knee, focus on keeping the knee straight while resting. CPM instructions: 0-90 degrees, 2 hours in the morning, 2 hours in the afternoon, and 2 hours in the evening. Place foam block, curve side up under heel at all times except when in CPM or when walking.  DO NOT modify, tear, cut, or change the foam block in any way.  MAKE SURE YOU:  Understand these instructions.  Get help right away if you are not doing well or get worse.    Thank you for letting us be a part of your medical care team.  It is a privilege we respect greatly.  We hope these instructions will help you stay on track for a fast and full recovery!   Increase activity slowly as tolerated   Complete by:  As directed       DISCHARGE MEDICATIONS:   Allergies as of 09/26/2017      Reactions   Penicillins Hives, Swelling, Rash, Other (See Comments)   SWELLING REACTION UNSPECIFIED  PATIENT HAS HAD A PCN REACTION WITH IMMEDIATE RASH, FACIAL/TONGUE/THROAT SWELLING, SOB, OR LIGHTHEADEDNESS WITH HYPOTENSION:  #  #  #  YES  #  #  #   Has patient had a PCN reaction causing severe rash involving mucus membranes or skin necrosis: No Had a PCN reaction that required hospitalization:  #  #  #   NO , but Needed to go to MD's OFFICE # # # Has patient had a PCN reaction occurring within the last 10 years: No      Medication List    STOP taking these medications   ibuprofen 200 MG tablet Commonly known as:  ADVIL,MOTRIN   nitroGLYCERIN 0.4 MG SL tablet Commonly known as:  NITROSTAT     TAKE these medications   albuterol 108 (90 Base) MCG/ACT inhaler Commonly known as:  PROVENTIL HFA;VENTOLIN HFA Inhale 1 puff into the lungs 2 (two) times daily as needed for wheezing.   aspirin 325 MG EC tablet Take 1 tablet (325 mg total) by mouth 2 (two) times daily. What changed:    medication strength  how much to take  when to take this   atorvastatin 80 MG tablet Commonly known as:  LIPITOR Take 0.5 tablets (40 mg total) by mouth daily.   DAILY MULTIVITAMIN PO Take 1 tablet by mouth daily.   FISH OIL PO Take 1 capsule by mouth daily.   Glucosamine-Chondroitin-MSM 500-200-150 MG Tabs Take 1 tablet by mouth daily.   lisinopril 10 MG tablet Commonly known as:  PRINIVIL,ZESTRIL Take 1 tablet (10 mg total) by mouth daily.   methocarbamol 500 MG tablet Commonly known as:  ROBAXIN Take 1-2 tablets (500-1,000 mg total) by mouth every 6 (six) hours as needed for muscle spasms.   metoprolol tartrate 25 MG tablet Commonly known as:  LOPRESSOR Take 1 tablet (25 mg total) by mouth 2 (two) times daily.   Oxycodone HCl 10 MG Tabs Take 1 tablet (10 mg total) by mouth every 4 (four) hours as needed for moderate pain (pain score 4-6).            Durable Medical Equipment  (From admission, onward)        Start     Ordered   09/25/17 1033  DME Walker rolling  Once    Question:  Patient needs a walker to treat with the following condition  Answer:  S/P total knee replacement   09/25/17 1032   09/25/17 1033  DME 3 n 1  Once     09/25/17 1032   09/25/17 1033  DME Bedside commode  Once    Question:  Patient needs a bedside commode to treat with the following condition   Answer:  S/P total knee replacement   09/25/17 1032      FOLLOW UP VISIT:    DISPOSITION: HOME VS. SNF  CONDITION:  Good   Guy Sandifer 09/26/2017, 12:53 PM

## 2017-09-26 NOTE — Progress Notes (Signed)
Physical Therapy Treatment Patient Details Name: Mario Underwood MRN: [id number] DOB: 08/26/1960 Today's Date: 09/26/2017    History of Present Illness Pt is a 57 y/o male s/p elective L TKA. PMH inlucdes CAD s/p stent placement, STEMI, HTN, asthma, and R TKA.     PT Comments    Patient is progressing well toward mobility goals and tolerated gait and stair training well. Continue to progress as tolerated.   Follow Up Recommendations  Follow surgeon's recommendation for DC plan and follow-up therapies;Supervision for mobility/OOB     Equipment Recommendations  None recommended by PT    Recommendations for Other Services       Precautions / Restrictions Precautions Precautions: Knee Precaution Comments: knee precautions reviewed with pt Restrictions Weight Bearing Restrictions: Yes LLE Weight Bearing: Weight bearing as tolerated    Mobility  Bed Mobility Overal bed mobility: Needs Assistance Bed Mobility: Supine to Sit;Sit to Supine     Supine to sit: Supervision Sit to supine: Supervision   General bed mobility comments: supervision for safety  Transfers Overall transfer level: Needs assistance Equipment used: Rolling walker (2 wheeled) Transfers: Sit to/from Stand Sit to Stand: Min guard         General transfer comment: cues for safe hand placement  Ambulation/Gait Ambulation/Gait assistance: Supervision Ambulation Distance (Feet): 200 Feet Assistive device: Rolling walker (2 wheeled) Gait Pattern/deviations: Step-to pattern;Decreased step length - right;Decreased weight shift to left;Antalgic;Step-through pattern;Decreased stance time - left Gait velocity: Decreased    General Gait Details: cues for posture, L heel strike/toe off, and step length symmetry; pt with improved step through pattern   Stairs Stairs: Yes   Stair Management: One rail Right;Step to pattern;Forwards Number of Stairs: 3 General stair comments: cues for sequencing and technique    Wheelchair Mobility    Modified Rankin (Stroke Patients Only)       Balance Overall balance assessment: Needs assistance Sitting-balance support: No upper extremity supported;Feet supported Sitting balance-Leahy Scale: Good     Standing balance support: Bilateral upper extremity supported;During functional activity Standing balance-Leahy Scale: Poor                              Cognition Arousal/Alertness: Awake/alert Behavior During Therapy: WFL for tasks assessed/performed Overall Cognitive Status: Within Functional Limits for tasks assessed                                        Exercises      General Comments        Pertinent Vitals/Pain Pain Assessment: Faces Faces Pain Scale: Hurts little more Pain Location: L thigh Pain Descriptors / Indicators: Guarding;Sore Pain Intervention(s): Limited activity within patient's tolerance;Monitored during session;Repositioned    Home Living                      Prior Function            PT Goals (current goals can now be found in the care plan section) Acute Rehab PT Goals Patient Stated Goal: to go home tomorrow  PT Goal Formulation: With patient Time For Goal Achievement: 10/09/17 Potential to Achieve Goals: Good Progress towards PT goals: Progressing toward goals    Frequency    7X/week      PT Plan Current plan remains appropriate    Co-evaluation  AM-PAC PT "6 Clicks" Daily Activity  Outcome Measure  Difficulty turning over in bed (including adjusting bedclothes, sheets and blankets)?: None Difficulty moving from lying on back to sitting on the side of the bed? : A Little Difficulty sitting down on and standing up from a chair with arms (e.g., wheelchair, bedside commode, etc,.)?: Unable Help needed moving to and from a bed to chair (including a wheelchair)?: A Little Help needed walking in hospital room?: A Little Help needed climbing 3-5  steps with a railing? : A Little 6 Click Score: 17    End of Session Equipment Utilized During Treatment: Gait belt Activity Tolerance: Patient tolerated treatment well Patient left: in bed;with call bell/phone within reach;in CPM;with family/visitor present Nurse Communication: Mobility status PT Visit Diagnosis: Other abnormalities of gait and mobility (R26.89);Pain Pain - Right/Left: Left Pain - part of body: Knee     Time: 0944-1000 PT Time Calculation (min) (ACUTE ONLY): 16 min  Charges:  $Gait Training: 8-22 mins                    G Codes:       Erline Levine, PTA Pager: [phone]     Carolynne Edouard 09/26/2017, 10:10 AM

## 2017-09-26 NOTE — Plan of Care (Signed)
  Completed/Met Education: Knowledge of General Education information will improve 09/26/2017 1610 - Completed/Met by Florence Yeung L, RN Health Behavior/Discharge Planning: Ability to manage health-related needs will improve 09/26/2017 1610 - Completed/Met by Asser Lucena L, RN Clinical Measurements: Ability to maintain clinical measurements within normal limits will improve 09/26/2017 1610 - Completed/Met by Newel Oien L, RN Will remain free from infection 09/26/2017 1610 - Completed/Met by Dynasty Holquin L, RN Diagnostic test results will improve 09/26/2017 1610 - Completed/Met by Mckinley Adelstein L, RN Respiratory complications will improve 09/26/2017 1610 - Completed/Met by Joshoa Shawler L, RN Cardiovascular complication will be avoided 09/26/2017 1610 - Completed/Met by Sheyanne Munley L, RN Activity: Risk for activity intolerance will decrease 09/26/2017 1610 - Completed/Met by Sha Burling L, RN Nutrition: Adequate nutrition will be maintained 09/26/2017 1610 - Completed/Met by Tuck Dulworth L, RN Coping: Level of anxiety will decrease 09/26/2017 1610 - Completed/Met by Klein Willcox L, RN Elimination: Will not experience complications related to bowel motility 09/26/2017 1610 - Completed/Met by Tom Ragsdale L, RN Will not experience complications related to urinary retention 09/26/2017 1610 - Completed/Met by Anjulie Dipierro L, RN Pain Managment: General experience of comfort will improve 09/26/2017 1610 - Completed/Met by Jazzmyne Rasnick L, RN Safety: Ability to remain free from injury will improve 09/26/2017 1610 - Completed/Met by Lamica Mccart L, RN Skin Integrity: Risk for impaired skin integrity will decrease 09/26/2017 1610 - Completed/Met by Askia Hazelip L, RN Clinical Measurements: Postoperative complications will be avoided or minimized 09/26/2017 1610 - Completed/Met by Trannie Bardales L, RN Skin Integrity: Demonstration of wound healing without infection  will improve 09/26/2017 1610 - Completed/Met by Jovanny Stephanie L, RN   

## 2017-09-27 ENCOUNTER — Encounter (HOSPITAL_COMMUNITY): Payer: Self-pay | Admitting: Orthopedic Surgery

## 2017-09-27 NOTE — Op Note (Signed)
TOTAL KNEE REPLACEMENT OPERATIVE NOTE:  09/25/2017  6:20 AM  PATIENT:  Mario Underwood  57 y.o. male  PRE-OPERATIVE DIAGNOSIS:  primary osteoarthritis left knee  POST-OPERATIVE DIAGNOSIS:  primary osteoarthritis left knee  PROCEDURE:  Procedure(s): LEFT TOTAL KNEE ARTHROPLASTY  SURGEON:  Surgeon(s): Dannielle Huh, MD  PHYSICIAN ASSISTANT: Laurier Nancy, [facility]  ANESTHESIA:   spinal  DRAINS: Hemovac  SPECIMEN: None  COUNTS:  Correct  TOURNIQUET:   Total Tourniquet Time Documented: Thigh (Left) - 44 minutes Total: Thigh (Left) - 44 minutes   DICTATION:  Indication for procedure:    The patient is a 57 y.o. male who has failed conservative treatment for primary osteoarthritis left knee.  Informed consent was obtained prior to anesthesia. The risks versus benefits of the operation were explain and in a way the patient can, and did, understand.   On the implant demand matching protocol, this patient scored 14.  Therefore, this patient did receive a polyethylene insert with vitamin E which is a high demand implant.  Description of procedure:     The patient was taken to the operating room and placed under anesthesia.  The patient was positioned in the usual fashion taking care that all body parts were adequately padded and/or protected.  I foley catheter was not placed.  A tourniquet was applied and the leg prepped and draped in the usual sterile fashion.  The extremity was exsanguinated with the esmarch and tourniquet inflated to 350 mmHg.  Pre-operative range of motion was normal.  The knee was in 5 degree of mild varus.  A midline incision approximately 6-7 inches long was made with a #10 blade.  A new blade was used to make a parapatellar arthrotomy going 2-3 cm into the quadriceps tendon, over the patella, and alongside the medial aspect of the patellar tendon.  A synovectomy was then performed with the #10 blade and forceps. I then elevated the deep MCL off the medial tibial  metaphysis subperiosteally around to the semimembranosus attachment.    I everted the patella and used calipers to measure patellar thickness.  I used the reamer to ream down to appropriate thickness to recreate the native thickness.  I then removed excess bone with the rongeur and sagittal saw.  I used the appropriately sized template and drilled the three lug holes.  I then put the trial in place and measured the thickness with the calipers to ensure recreation of the native thickness.  The trial was then removed and the patella subluxed and the knee brought into flexion.  A homan retractor was place to retract and protect the patella and lateral structures.  A Z-retractor was place medially to protect the medial structures.  The extra-medullary alignment system was used to make cut the tibial articular surface perpendicular to the anamotic axis of the tibia and in 3 degrees of posterior slope.  The cut surface and alignment jig was removed.  I then used the intramedullary alignment guide to make a 6 valgus cut on the distal femur.  I then marked out the epicondylar axis on the distal femur.  The posterior condylar axis measured 3 degrees.  I then used the anterior referencing sizer and measured the femur to be a size 9.  The 4-In-1 cutting block was screwed into place in external rotation matching the posterior condylar angle, making our cuts perpendicular to the epicondylar axis.  Anterior, posterior and chamfer cuts were made with the sagittal saw.  The cutting block and cut pieces were  removed.  A lamina spreader was placed in 90 degrees of flexion.  The ACL, PCL, menisci, and posterior condylar osteophytes were removed.  A 18 mm spacer blocked was found to offer good flexion and extension gap balance after mild in degree releasing.   The scoop retractor was then placed and the femoral finishing block was pinned in place.  The small sagittal saw was used as well as the lug drill to finish the femur.   The block and cut surfaces were removed and the medullary canal hole filled with autograft bone from the cut pieces.  The tibia was delivered forward in deep flexion and external rotation.  A size F tray was selected and pinned into place centered on the medial 1/3 of the tibial tubercle.  The reamer and keel was used to prepare the tibia through the tray.    I then trialed with the size 9 femur, size F tibia, a 18 mm insert and the 35 patella.  I had excellent flexion/extension gap balance, excellent patella tracking.  Flexion was full and beyond 120 degrees; extension was zero.  These components were chosen and the staff opened them to me on the back table while the knee was lavaged copiously and the cement mixed.  The soft tissue was infiltrated with 60cc of exparel 1.3% through a 21 gauge needle.  I cemented in the components and removed all excess cement.  The polyethylene tibial component was snapped into place and the knee placed in extension while cement was hardening.  The capsule was infilltrated with 30cc of .25% Marcaine with epinephrine.  A hemovac was place in the joint exiting superolaterally.  A pain pump was place superomedially superficial to the arthrotomy.  Once the cement was hard, the tourniquet was let down.  Hemostasis was obtained.  The arthrotomy was closed with figure-8 #1 vicryl sutures.  The deep soft tissues were closed with #0 vicryls and the subcuticular layer closed with a running #2-0 vicryl.  The skin was reapproximated and closed with skin staples.  The wound was dressed with xeroform, 4 x4's, 2 ABD sponges, a single layer of webril and a TED stocking.   The patient was then awakened, extubated, and taken to the recovery room in stable condition.  BLOOD LOSS:  300cc DRAINS: 1 hemovac, 1 pain catheter COMPLICATIONS:  None.  PLAN OF CARE: Admit for overnight observation  PATIENT DISPOSITION:  PACU - hemodynamically stable.   Delay start of Pharmacological VTE agent  (>24hrs) due to surgical blood loss or risk of bleeding:  not applicable  Please fax a copy of this op note to my office at [phone] (please only include page 1 and 2 of the Case Information op note)

## 2017-09-28 ENCOUNTER — Encounter (HOSPITAL_COMMUNITY): Payer: Self-pay | Admitting: Orthopedic Surgery

## 2018-02-11 ENCOUNTER — Other Ambulatory Visit: Payer: Self-pay | Admitting: Cardiovascular Disease

## 2018-02-11 DIAGNOSIS — I252 Old myocardial infarction: Secondary | ICD-10-CM

## 2018-02-11 DIAGNOSIS — I10 Essential (primary) hypertension: Secondary | ICD-10-CM

## 2018-03-03 ENCOUNTER — Other Ambulatory Visit: Payer: Self-pay | Admitting: Cardiovascular Disease

## 2018-03-03 DIAGNOSIS — I252 Old myocardial infarction: Secondary | ICD-10-CM

## 2018-03-03 DIAGNOSIS — I10 Essential (primary) hypertension: Secondary | ICD-10-CM

## 2018-05-11 ENCOUNTER — Encounter: Payer: Self-pay | Admitting: Cardiovascular Disease

## 2018-05-11 ENCOUNTER — Ambulatory Visit: Payer: Commercial Managed Care - PPO | Admitting: Cardiovascular Disease

## 2018-05-11 VITALS — BP 122/88 | HR 68 | Ht 72.0 in | Wt 232.0 lb

## 2018-05-11 DIAGNOSIS — I10 Essential (primary) hypertension: Secondary | ICD-10-CM | POA: Diagnosis not present

## 2018-05-11 DIAGNOSIS — I25810 Atherosclerosis of coronary artery bypass graft(s) without angina pectoris: Secondary | ICD-10-CM

## 2018-05-11 DIAGNOSIS — E782 Mixed hyperlipidemia: Secondary | ICD-10-CM | POA: Diagnosis not present

## 2018-05-11 LAB — LIPID PANEL
CHOL/HDL RATIO: 3.2 ratio (ref 0.0–5.0)
CHOLESTEROL TOTAL: 117 mg/dL (ref 100–199)
HDL: 37 mg/dL — ABNORMAL LOW (ref >39)
LDL CALC: 62 mg/dL (ref 0–99)
TRIGLYCERIDES: 88 mg/dL (ref 0–149)
VLDL CHOLESTEROL CAL: 18 mg/dL (ref 5–40)

## 2018-05-11 LAB — COMPREHENSIVE METABOLIC PANEL
A/G RATIO: 1.8 (ref 1.2–2.2)
ALK PHOS: 102 IU/L (ref 39–117)
ALT: 22 IU/L (ref 0–44)
AST: 23 IU/L (ref 0–40)
Albumin: 4.4 g/dL (ref 3.5–5.5)
BUN/Creatinine Ratio: 14 (ref 9–20)
BUN: 14 mg/dL (ref 6–24)
Bilirubin Total: 0.3 mg/dL (ref 0.0–1.2)
CHLORIDE: 103 mmol/L (ref 96–106)
CO2: 20 mmol/L (ref 20–29)
Calcium: 9.5 mg/dL (ref 8.7–10.2)
Creatinine, Ser: 1 mg/dL (ref 0.76–1.27)
GFR calc Af Amer: 98 mL/min/{1.73_m2} (ref >59)
GFR, EST NON AFRICAN AMERICAN: 84 mL/min/{1.73_m2} (ref >59)
GLOBULIN, TOTAL: 2.4 g/dL (ref 1.5–4.5)
Glucose: 88 mg/dL (ref 65–99)
POTASSIUM: 4.2 mmol/L (ref 3.5–5.2)
SODIUM: 141 mmol/L (ref 134–144)
Total Protein: 6.8 g/dL (ref 6.0–8.5)

## 2018-05-11 LAB — CBC WITH DIFFERENTIAL/PLATELET
BASOS ABS: 0 10*3/uL (ref 0.0–0.2)
BASOS: 0 %
EOS (ABSOLUTE): 0.3 10*3/uL (ref 0.0–0.4)
Eos: 5 %
HEMATOCRIT: 45.5 % (ref 37.5–51.0)
HEMOGLOBIN: 15.7 g/dL (ref 13.0–17.7)
IMMATURE GRANS (ABS): 0 10*3/uL (ref 0.0–0.1)
IMMATURE GRANULOCYTES: 0 %
Lymphocytes Absolute: 2.3 10*3/uL (ref 0.7–3.1)
Lymphs: 35 %
MCH: 30.8 pg (ref 26.6–33.0)
MCHC: 34.5 g/dL (ref 31.5–35.7)
MCV: 89 fL (ref 79–97)
MONOS ABS: 1.1 10*3/uL — AB (ref 0.1–0.9)
Monocytes: 17 %
NEUTROS PCT: 43 %
Neutrophils Absolute: 3 10*3/uL (ref 1.4–7.0)
Platelets: 216 10*3/uL (ref 150–450)
RBC: 5.09 x10E6/uL (ref 4.14–5.80)
RDW: 12.9 % (ref 12.3–15.4)
WBC: 6.8 10*3/uL (ref 3.4–10.8)

## 2018-05-11 NOTE — Progress Notes (Signed)
Cardiology Office Note:    Date:  05/11/2018   ID:  Mario Underwood, DOB 07/22/1961, MRN [id number]  PCP:  Patient, No Pcp Per  Cardiologist:  Tonny Bollman, MD  Electrophysiologist:  None   Referring MD: No ref. provider found   Chief Complaint  Patient presents with  . Coronary Artery Disease    History of Present Illness:    Mario Underwood is a 57 y.o. male with a hx of coronary artery disease presented for follow-up evaluation.  The patient initially presented in 2012 with an acute inferior wall MI treated with a drug-eluting stent in the right coronary artery followed by staged PCI of the proximal LAD.  He has had no recurrent ischemic events.  The patient is here alone today.  He had a very stressful year.  He has had to put his father in a nursing home because of progressive decline.  This is been pretty tough on him and his family.  He also had left knee surgery this spring and has not recovered as well as he did from his right knee surgery.  He is gained some weight because of inactivity.  He has not had any cardiac-related complaints.  He specifically denies chest pain, chest pressure, or shortness of breath.  No edema, orthopnea, or PND.  No heart palpitations.  Past Medical History:  Diagnosis Date  . Asthma    once.week  . CAD (coronary artery disease)    A.  Inferior STEMI 12/12/10, treated with a Xience DES;  B.  Staged PCI of the LAD with a Promus DES;  C.  EF 55%, inferior AK  . Hyperlipemia   . Hypertension   . Myocardial infarction [facility]) 2012   2 stents placed  . Osteoarthritis    Right knee TKR pending with Dr. Charlann Boxer  . Pneumonia     Past Surgical History:  Procedure Laterality Date  . ARTHROSCOPIC REPAIR ACL Right   . CORONARY STENT PLACEMENT  2012  . HERNIA REPAIR     left inguinal  . TOTAL KNEE ARTHROPLASTY Right 10/07/2013   Procedure: RIGHT TOTAL KNEE ARTHROPLASTY;  Surgeon: Dannielle Huh, MD;  Location: [facility] OR;  Service: Orthopedics;  Laterality:  Right;  . TOTAL KNEE ARTHROPLASTY Left 09/25/2017   Procedure: LEFT TOTAL KNEE ARTHROPLASTY;  Surgeon: Dannielle Huh, MD;  Location: [facility] OR;  Service: Orthopedics;  Laterality: Left;  . urology procedure      Current Medications: Current Meds  Medication Sig  . albuterol (PROVENTIL HFA;VENTOLIN HFA) 108 (90 BASE) MCG/ACT inhaler Inhale 1 puff into the lungs 2 (two) times daily as needed for wheezing.  Marland Kitchen aspirin EC 81 MG tablet Take 81 mg by mouth daily.  Marland Kitchen atorvastatin (LIPITOR) 80 MG tablet TAKE 1/2 TABLET(=40MG  ) BY MOUTH DAILY  . Glucosamine-Chondroitin-MSM 500-200-150 MG TABS Take 1 tablet by mouth daily.   Marland Kitchen lisinopril (PRINIVIL,ZESTRIL) 10 MG tablet TAKE 1 TABLET BY MOUTH EVERY DAY  . metoprolol tartrate (LOPRESSOR) 25 MG tablet TAKE 1 TABLET BY MOUTH TWICE A DAY  . Multiple Vitamin (DAILY MULTIVITAMIN PO) Take 1 tablet by mouth daily.   . Omega-3 Fatty Acids (FISH OIL PO) Take 1 capsule by mouth daily.  . [DISCONTINUED] aspirin EC 325 MG EC tablet Take 1 tablet (325 mg total) by mouth 2 (two) times daily.  . [DISCONTINUED] methocarbamol (ROBAXIN) 500 MG tablet Take 1-2 tablets (500-1,000 mg total) by mouth every 6 (six) hours as needed for muscle spasms.  . [DISCONTINUED] oxyCODONE 10  MG TABS Take 1 tablet (10 mg total) by mouth every 4 (four) hours as needed for moderate pain (pain score 4-6).     Allergies:   Penicillins   Social History   Socioeconomic History  . Marital status: Married    Spouse name: Not on file  . Number of children: Not on file  . Years of education: Not on file  . Highest education level: Not on file  Occupational History  . Not on file  Social Needs  . Financial resource strain: Not on file  . Food insecurity:    Worry: Not on file    Inability: Not on file  . Transportation needs:    Medical: Not on file    Non-medical: Not on file  Tobacco Use  . Smoking status: Never Smoker  . Smokeless tobacco: Current User    Types: Chew  Substance and  Sexual Activity  . Alcohol use: Yes    Comment: weekends  . Drug use: No  . Sexual activity: Yes  Lifestyle  . Physical activity:    Days per week: Not on file    Minutes per session: Not on file  . Stress: Not on file  Relationships  . Social connections:    Talks on phone: Not on file    Gets together: Not on file    Attends religious service: Not on file    Active member of club or organization: Not on file    Attends meetings of clubs or organizations: Not on file    Relationship status: Not on file  Other Topics Concern  . Not on file  Social History Narrative  . Not on file     Family History: The patient's family history includes Coronary artery disease in his sister; Heart disease in his mother.  ROS:   Please see the history of present illness.    Positive for cough, wheezing (under treatment for an upper respiratory infection).  All other systems reviewed and are negative.  EKGs/Labs/Other Studies Reviewed:    EKG:  EKG is ordered today.  The ekg ordered today demonstrates normal sinus rhythm with sinus arrhythmia 68 bpm, left ventricular hypertrophy, age-indeterminate inferior infarct.  Recent Labs: 09/14/2017: ALT 23 09/26/2017: BUN 15; Creatinine, Ser 1.00; Hemoglobin 14.3; Platelets 231; Potassium 4.7; Sodium 137  Recent Lipid Panel    Component Value Date/Time   CHOL 166 02/02/2017 1620   TRIG 170 (H) 02/02/2017 1620   HDL 40 02/02/2017 1620   CHOLHDL 4.2 02/02/2017 1620   CHOLHDL 3.4 02/19/2016 1021   VLDL 30 02/19/2016 1021   LDLCALC 92 02/02/2017 1620   LDLDIRECT  12/22/2010 0345    82 (NOTE) ATP III Classification (LDL):      < 100        mg/dL         Optimal     161 - 129     mg/dL         Near or Above Optimal     130 - 159     mg/dL         Borderline High     160 - 189     mg/dL         High      > 096        mg/dL         Very  High     Physical Exam:    VS:  BP 122/88   Pulse 68  Ht 6' (1.829 m)   Wt 232 lb (105.2 kg)   BMI 31.46  kg/m     Wt Readings from Last 3 Encounters:  05/11/18 232 lb (105.2 kg)  09/25/17 239 lb (108.4 kg)  09/14/17 239 lb 1.6 oz (108.5 kg)     GEN:  Well nourished, well developed in no acute distress HEENT: Normal NECK: No JVD; No carotid bruits LYMPHATICS: No lymphadenopathy CARDIAC: RRR, no murmurs, rubs, gallops RESPIRATORY:  Clear to auscultation without rales, wheezing or rhonchi  ABDOMEN: Soft, non-tender, non-distended MUSCULOSKELETAL:  No edema; No deformity  SKIN: Warm and dry NEUROLOGIC:  Alert and oriented x 3 PSYCHIATRIC:  Normal affect   ASSESSMENT:    1. Coronary artery disease involving coronary bypass graft of native heart without angina pectoris   2. Mixed hyperlipidemia   3. Essential hypertension    PLAN:    In order of problems listed above:  1. The patient is stable with no symptoms of angina.  He remains on aspirin 81 mg for antiplatelet therapy, atorvastatin for lipid-lowering, and a beta-blocker.  No changes are made in his regimen today. 2. Patient is due for a lipid panel.  He is fasting today.  He remains on atorvastatin 40 mg daily.  His last lipids are reviewed.  We discussed lifestyle modification at length today.  He understands the need for diet and increased exercise. 3. Blood pressure treated with lisinopril and metoprolol.  He will work on lifestyle modification.   Medication Adjustments/Labs and Tests Ordered: Current medicines are reviewed at length with the patient today.  Concerns regarding medicines are outlined above.  Orders Placed This Encounter  Procedures  . Comprehensive metabolic panel  . CBC with Differential/Platelet  . Lipid panel  . EKG 12-Lead   No orders of the defined types were placed in this encounter.   Patient Instructions  Medication Instructions:  Your provider recommends that you continue on your current medications as directed. Please refer to the Current Medication list given to you today.     Labwork: TODAY: CMET, CBC, lipids  Testing/Procedures: None  Follow-Up: Your provider wants you to follow-up in: 1 year with Dr. Excell Seltzer. You will receive a reminder letter in the mail two months in advance. If you Underwood't receive a letter, please call our office to schedule the follow-up appointment.    Any Other Special Instructions Will Be Listed Below (If Applicable).     If you need a refill on your cardiac medications before your next appointment, please call your pharmacy.      Signed, Tonny Bollman, MD  05/11/2018 1:19 PM    [facility]

## 2018-05-11 NOTE — Patient Instructions (Signed)
Medication Instructions:  Your provider recommends that you continue on your current medications as directed. Please refer to the Current Medication list given to you today.    Labwork: TODAY: CMET, CBC, lipids  Testing/Procedures: None  Follow-Up: Your provider wants you to follow-up in: 1 year with Dr. Cooper. You will receive a reminder letter in the mail two months in advance. If you don't receive a letter, please call our office to schedule the follow-up appointment.    Any Other Special Instructions Will Be Listed Below (If Applicable).     If you need a refill on your cardiac medications before your next appointment, please call your pharmacy.   

## 2018-06-10 ENCOUNTER — Other Ambulatory Visit: Payer: Self-pay | Admitting: Cardiovascular Disease

## 2018-06-10 DIAGNOSIS — I10 Essential (primary) hypertension: Secondary | ICD-10-CM

## 2018-06-10 DIAGNOSIS — I252 Old myocardial infarction: Secondary | ICD-10-CM

## 2018-08-08 ENCOUNTER — Other Ambulatory Visit: Payer: Self-pay | Admitting: Cardiovascular Disease

## 2018-08-08 DIAGNOSIS — I252 Old myocardial infarction: Secondary | ICD-10-CM

## 2018-08-08 DIAGNOSIS — I10 Essential (primary) hypertension: Secondary | ICD-10-CM

## 2018-08-08 NOTE — Telephone Encounter (Signed)
Pt's pharmacy is requesting a refill on pt's medication Nitroglycerin. This medication was D/C in the hospital. Would Dr. Excell Seltzer like to reorder this medication. Please address

## 2018-09-07 ENCOUNTER — Other Ambulatory Visit: Payer: Self-pay | Admitting: Cardiovascular Disease

## 2018-09-07 DIAGNOSIS — I10 Essential (primary) hypertension: Secondary | ICD-10-CM

## 2018-09-07 DIAGNOSIS — I252 Old myocardial infarction: Secondary | ICD-10-CM

## 2018-12-18 ENCOUNTER — Telehealth: Payer: Self-pay | Admitting: *Deleted

## 2018-12-18 NOTE — Telephone Encounter (Signed)
Call from patient's wife. Needs follow up with Dr. Excell Seltzer.  Last seen 10/19.  She reports he had MI at age 58 and got 2 stents (2012) Over this past weekend in [place] had another MI [facility]) Emergent cath ,100% blockage of one of the previously stented arteries. Has 4 stents in this artery now.   Has records.    Home now.  Please call her [phone].

## 2018-12-18 NOTE — Telephone Encounter (Signed)
[place]. He would like to have evaluation by Dr. Excell Seltzer and review his records.  Scheduled him 6/17 for face to face visit with Dr. Excell Seltzer. He will bring records for review. He was grateful for assistance.

## 2019-01-08 ENCOUNTER — Telehealth: Payer: Self-pay

## 2019-01-08 NOTE — Telephone Encounter (Signed)
Called to screen patient for visit tomorrow. He understands to wear a mask. He understands there is a no Teaching laboratory technician. He understands to call if any of the below answers change prior to the visit tomorrow.  COVID-19 Pre-Screening Questions:  . In the past 7 to 10 days have you had a cough,  shortness of breath, headache, congestion, fever (100 or greater) body aches, chills, sore throat, or sudden loss of taste or sense of smell? NO . Have you been around anyone with known Covid 19? NO . Have you been around anyone who is awaiting Covid 19 test results in the past 7 to 10 days? NO . Have you been around anyone who has been exposed to Covid 19, or has mentioned symptoms of Covid 19 within the past 7 to 10 days? NO

## 2019-01-09 ENCOUNTER — Other Ambulatory Visit: Payer: Self-pay

## 2019-01-09 ENCOUNTER — Encounter: Payer: Self-pay | Admitting: Cardiovascular Disease

## 2019-01-09 ENCOUNTER — Ambulatory Visit: Payer: Commercial Managed Care - PPO | Admitting: Cardiovascular Disease

## 2019-01-09 VITALS — BP 126/78 | HR 75 | Ht 72.0 in | Wt 224.4 lb

## 2019-01-09 DIAGNOSIS — I2510 Atherosclerotic heart disease of native coronary artery without angina pectoris: Secondary | ICD-10-CM

## 2019-01-09 DIAGNOSIS — E782 Mixed hyperlipidemia: Secondary | ICD-10-CM

## 2019-01-09 MED ORDER — PRASUGREL HCL 10 MG PO TABS: 10.0000 mg | ORAL_TABLET | Freq: Every day | ORAL | 11 refills | Status: DC

## 2019-01-09 NOTE — Progress Notes (Signed)
Cardiology Office Note:    Date:  01/09/2019   ID:  Mario Underwood, DOB June 15, 1961, MRN [id number]  PCP:  Patient, No Pcp Per  Cardiologist:  Sherren Mocha, MD  Electrophysiologist:  None   Referring MD: No ref. provider found   Chief Complaint  Patient presents with   Chest Pain    History of Present Illness:    Mario Underwood is a 58 y.o. male with a hx of coronary artery disease, presenting for follow-up evaluation after recent acute MI.  The patient has been followed for many years.  He initially presented in 2012 with an acute inferior wall MI treated with a drug-eluting stent in the RCA followed by staged PCI of the proximal LAD.  He had done very well with no recurrent events until last month when he presented with a recurrent acute inferior STEMI.  He was then impending cardiogenic shock with complete heart block and was found to have very late stent thrombosis of the RCA.  His LAD stent was patent with no stenosis and there was no disease noted in the left mainstem or left circumflex.  He underwent primary PCI of the RCA and multiple drug-eluting stents were placed.  I have reviewed the catheterization report which describes a 2.25 x 24 mm Synergy DES in the posterior lateral branch, a 3.0 x 38 mm Synergy DES in the distal RCA, a 3.5 x 38 mm Synergy DES in the mid RCA and a 3.5 x 24 mm Synergy DES in the proximal RCA all of this was postdilated with a 3.5 mm noncompliant balloon to 20 atm.  There is TIMI-3 flow at the completion of the procedure and the patient was discharged after about 24 hours of observation.  The patient has done well since his hospitalization.  All of this was performed at [facility] in [place], [place].  He is back home with no further problems except for some generalized fatigue.  He denies any recurrence of chest pain or pressure.  He denies shortness of breath, orthopnea, or PND.  He was discharged on aspirin and Effient.   He denies any bleeding or bruising problems.  Past Medical History:  Diagnosis Date   Asthma    once.week   CAD (coronary artery disease)    A.  Inferior STEMI 12/12/10, treated with a Xience DES;  B.  Staged PCI of the LAD with a Promus DES;  C.  EF 55%, inferior AK   Hyperlipemia    Hypertension    Myocardial infarction [facility]) 2012   2 stents placed   Osteoarthritis    Right knee TKR pending with Dr. Alvan Dame   Pneumonia     Past Surgical History:  Procedure Laterality Date   ARTHROSCOPIC REPAIR ACL Right    CORONARY STENT PLACEMENT  2012   HERNIA REPAIR     left inguinal   TOTAL KNEE ARTHROPLASTY Right 10/07/2013   Procedure: RIGHT TOTAL KNEE ARTHROPLASTY;  Surgeon: Vickey Huger, MD;  Location: [place];  Service: Orthopedics;  Laterality: Right;   TOTAL KNEE ARTHROPLASTY Left 09/25/2017   Procedure: LEFT TOTAL KNEE ARTHROPLASTY;  Surgeon: Vickey Huger, MD;  Location: [place];  Service: Orthopedics;  Laterality: Left;   urology procedure      Current Medications: Current Meds  Medication Sig   albuterol (PROVENTIL HFA;VENTOLIN HFA) 108 (90 BASE) MCG/ACT inhaler Inhale 1 puff into the lungs 2 (two) times daily as needed for wheezing.   aspirin EC  81 MG tablet Take 81 mg by mouth daily.   atorvastatin (LIPITOR) 80 MG tablet TAKE 1/2 TABLET BY MOUTH DAILY   Glucosamine-Chondroitin-MSM 500-200-150 MG TABS Take 1 tablet by mouth daily.    lisinopril (PRINIVIL,ZESTRIL) 10 MG tablet TAKE 1 TABLET BY MOUTH EVERY DAY   metoprolol tartrate (LOPRESSOR) 25 MG tablet TAKE 1 TABLET BY MOUTH TWICE A DAY   Multiple Vitamin (DAILY MULTIVITAMIN PO) Take 1 tablet by mouth daily.    nitroGLYCERIN (NITROSTAT) 0.4 MG SL tablet PLACE 1 TABLET (0.4 MG TOTAL) UNDER THE TONGUE EVERY 5 (FIVE) MINUTES AS NEEDED FOR CHEST PAIN.   Omega-3 Fatty Acids (FISH OIL PO) Take 1 capsule by mouth daily.   prasugrel (EFFIENT) 10 MG TABS tablet Take 1 tablet (10 mg total) by mouth daily.    [DISCONTINUED] prasugrel (EFFIENT) 10 MG TABS tablet Take 10 mg by mouth daily.     Allergies:   Penicillins   Social History   Socioeconomic History   Marital status: Married    Spouse name: Not on file   Number of children: Not on file   Years of education: Not on file   Highest education level: Not on file  Occupational History   Not on file  Social Needs   Financial resource strain: Not on file   Food insecurity    Worry: Not on file    Inability: Not on file   Transportation needs    Medical: Not on file    Non-medical: Not on file  Tobacco Use   Smoking status: Never Smoker   Smokeless tobacco: Current User    Types: Chew  Substance and Sexual Activity   Alcohol use: Yes    Comment: weekends   Drug use: No   Sexual activity: Yes  Lifestyle   Physical activity    Days per week: Not on file    Minutes per session: Not on file   Stress: Not on file  Relationships   Social connections    Talks on phone: Not on file    Gets together: Not on file    Attends religious service: Not on file    Active member of club or organization: Not on file    Attends meetings of clubs or organizations: Not on file    Relationship status: Not on file  Other Topics Concern   Not on file  Social History Narrative   Not on file     Family History: The patient's family history includes Coronary artery disease in his sister; Heart disease in his mother.  ROS:   Please see the history of present illness.    All other systems reviewed and are negative.  EKGs/Labs/Other Studies Reviewed:    The following studies were reviewed today:   EKG:  EKG is not ordered today.   Recent Labs: 05/11/2018: ALT 22; BUN 14; Creatinine, Ser 1.00; Hemoglobin 15.7; Platelets 216; Potassium 4.2; Sodium 141  Recent Lipid Panel    Component Value Date/Time   CHOL 117 05/11/2018 0955   TRIG 88 05/11/2018 0955   HDL 37 (L) 05/11/2018 0955   CHOLHDL 3.2 05/11/2018 0955    CHOLHDL 3.4 02/19/2016 1021   VLDL 30 02/19/2016 1021   LDLCALC 62 05/11/2018 0955   LDLDIRECT  12/22/2010 0345    82 (NOTE) ATP III Classification (LDL):      < 100        mg/dL         Optimal     100 -  129     mg/dL         Near or Above Optimal     130 - 159     mg/dL         Borderline High     160 - 189     mg/dL         High      > 190        mg/dL         Very  High     Physical Exam:    VS:  BP 126/78    Pulse 75    Ht 6' (1.829 m)    Wt 224 lb 6.4 oz (101.8 kg)    SpO2 95%    BMI 30.43 kg/m     Wt Readings from Last 3 Encounters:  01/09/19 224 lb 6.4 oz (101.8 kg)  05/11/18 232 lb (105.2 kg)  09/25/17 239 lb (108.4 kg)     GEN:  Well nourished, well developed in no acute distress HEENT: Normal NECK: No JVD; No carotid bruits LYMPHATICS: No lymphadenopathy CARDIAC: RRR, no murmurs, rubs, gallops RESPIRATORY:  Clear to auscultation without rales, wheezing or rhonchi  ABDOMEN: Soft, non-tender, non-distended MUSCULOSKELETAL:  No edema; No deformity  SKIN: Warm and dry NEUROLOGIC:  Alert and oriented x 3 PSYCHIATRIC:  Normal affect   ASSESSMENT:    1. Coronary artery disease involving native coronary artery of native heart without angina pectoris   2. Mixed hyperlipidemia      PLAN:    In order of problems listed above:  1. The patient is stable after his recent inferior wall MI.  We had a lengthy discussion today about the mechanism of very late stent thrombosis.  He had no preceding anginal symptoms.  I am going to send for the cardiac catheterization films.  It appears that he has now had full metal jacket stenting of the entire RCA based on my review of the report.  I think he will be best served with lifelong dual antiplatelet therapy unless some contraindication arises.  We can discuss whether to switch him from Effient to generic clopidogrel after 12 months.  We will likely perform a stress Myoview scan in about 12 months considering his extensive history with  multivessel disease and multiple stents. 2. The patient is on a high intensity statin drug.  We will draw labs today including lipids and LFTs.  We had extensive discussion today in the office.  His wife participated via Administrator and all of their questions are answered.  I will see them back in 6 months with plans for no medication changes at this time.  I will send for his cardiac catheterization films.  I will tentatively plan on a stress nuclear scan in about 1 year.   Medication Adjustments/Labs and Tests Ordered: Current medicines are reviewed at length with the patient today.  Concerns regarding medicines are outlined above.  Orders Placed This Encounter  Procedures   Comprehensive metabolic panel   Lipid panel   Meds ordered this encounter  Medications   prasugrel (EFFIENT) 10 MG TABS tablet    Sig: Take 1 tablet (10 mg total) by mouth daily.    Dispense:  30 tablet    Refill:  11    Patient Instructions  Medication Instructions:  Your provider recommends that you continue on your current medications as directed. Please refer to the Current Medication list given to you today.    Labwork: TODAY!  Testing/Procedures: None  Follow-Up: Your provider wants you to follow-up in: 6 months with Dr. Burt Knack. You will receive a reminder letter in the mail two months in advance. If you don't receive a letter, please call our office to schedule the follow-up appointment.      Signed, Sherren Mocha, MD  01/09/2019 5:53 PM    [place]

## 2019-01-09 NOTE — Patient Instructions (Signed)
Medication Instructions:  Your provider recommends that you continue on your current medications as directed. Please refer to the Current Medication list given to you today.    Labwork: TODAY!  Testing/Procedures: None  Follow-Up: Your provider wants you to follow-up in: 6 months with Dr. Burt Knack. You will receive a reminder letter in the mail two months in advance. If you don't receive a letter, please call our office to schedule the follow-up appointment.

## 2019-01-10 LAB — COMPREHENSIVE METABOLIC PANEL
ALT: 30 IU/L (ref 0–44)
AST: 29 IU/L (ref 0–40)
Albumin/Globulin Ratio: 2.1 (ref 1.2–2.2)
Albumin: 4.8 g/dL (ref 3.8–4.9)
Alkaline Phosphatase: 117 IU/L (ref 39–117)
BUN/Creatinine Ratio: 10 (ref 9–20)
BUN: 8 mg/dL (ref 6–24)
Bilirubin Total: 0.7 mg/dL (ref 0.0–1.2)
CO2: 23 mmol/L (ref 20–29)
Calcium: 9.8 mg/dL (ref 8.7–10.2)
Chloride: 102 mmol/L (ref 96–106)
Creatinine, Ser: 0.82 mg/dL (ref 0.76–1.27)
GFR calc Af Amer: 115 mL/min/{1.73_m2} (ref >59)
GFR calc non Af Amer: 100 mL/min/{1.73_m2} (ref >59)
Globulin, Total: 2.3 g/dL (ref 1.5–4.5)
Glucose: 75 mg/dL (ref 65–99)
Potassium: 4.1 mmol/L (ref 3.5–5.2)
Sodium: 142 mmol/L (ref 134–144)
Total Protein: 7.1 g/dL (ref 6.0–8.5)

## 2019-01-10 LAB — LIPID PANEL
Chol/HDL Ratio: 4.3 ratio (ref 0.0–5.0)
Cholesterol, Total: 156 mg/dL (ref 100–199)
HDL: 36 mg/dL — ABNORMAL LOW (ref >39)
LDL Calculated: 81 mg/dL (ref 0–99)
Triglycerides: 196 mg/dL — ABNORMAL HIGH (ref 0–149)
VLDL Cholesterol Cal: 39 mg/dL (ref 5–40)

## 2019-01-15 ENCOUNTER — Telehealth: Payer: Self-pay

## 2019-01-15 DIAGNOSIS — I252 Old myocardial infarction: Secondary | ICD-10-CM

## 2019-01-15 DIAGNOSIS — I10 Essential (primary) hypertension: Secondary | ICD-10-CM

## 2019-01-15 MED ORDER — ATORVASTATIN CALCIUM 80 MG PO TABS: 80.0000 mg | ORAL_TABLET | Freq: Every day | ORAL | 11 refills | Status: DC

## 2019-01-15 NOTE — Telephone Encounter (Signed)
Reviewed results with patient who verbalized understanding.   Instructed patient to INCREASE LIPITOR To 80 mg daily.  FLP and LFTs scheduled 9/23.  Patient agrees with treatment plan.   Also, the patient brought papers to fill out at visit last week. Informed the patient that Dr. Veverly Fells will have to fill out most of the paperwork as he did the procedure. Will fax paperwork to [phone]. Will mail originals to the patient. He was grateful for assistance.  Will request cath films from Dr. Veverly Fells: Phone: [phone] Fax: [phone]

## 2019-01-15 NOTE — Telephone Encounter (Signed)
-----   Message from Sherren Mocha, MD sent at 01/14/2019 11:19 AM EDT ----- Would increase atorvastatin to 80 mg daily considering his recent ACS event. thanks

## 2019-01-16 NOTE — Telephone Encounter (Signed)
Cath films requested.

## 2019-01-17 NOTE — Telephone Encounter (Signed)
Cath films received on CD.

## 2019-02-01 ENCOUNTER — Telehealth: Payer: Self-pay | Admitting: Cardiovascular Disease

## 2019-02-01 NOTE — Telephone Encounter (Signed)
Wife of Patient states the patient  had a Heart Attack while on vacation at [facility].  The Patient was treated at [facility] in [place].  The hospital called to check how the patient was doing with his Cardiac rehab was going. The wife reported to  [facility] that the patient was not doing any Cardiac Rehab.   Adelfa Koh will fax orders to a nearby cardiac rehab facility to both the rehab facility as well as Dr. Antionette Char office  The wife just wants to know if Dr. Burt Knack thinks the patient needs to do Cardiac Rehab. If so, then please let the wife know.

## 2019-02-05 NOTE — Telephone Encounter (Signed)
Please let patient know that Dr. Burt Knack is out of town, but that we would definitely recommend cardiac rehab and can you place the referral if they want to proceed.

## 2019-02-05 NOTE — Telephone Encounter (Signed)
Spoke with the wife, the hospital in [facility] sent a cardiac rehab order to [place]. The wife stated that she needed to contact the rehab department if Dr. Burt Knack agreed. Advised that his PA stated he would be agree to cardiac rehab. She is calling to get the process started.

## 2019-04-17 ENCOUNTER — Other Ambulatory Visit: Payer: Self-pay

## 2019-04-17 ENCOUNTER — Other Ambulatory Visit: Payer: Commercial Managed Care - PPO | Admitting: *Deleted

## 2019-04-17 DIAGNOSIS — I252 Old myocardial infarction: Secondary | ICD-10-CM

## 2019-04-17 LAB — HEPATIC FUNCTION PANEL
ALT: 28 IU/L (ref 0–44)
AST: 28 IU/L (ref 0–40)
Albumin: 4.4 g/dL (ref 3.8–4.9)
Alkaline Phosphatase: 133 IU/L — ABNORMAL HIGH (ref 39–117)
Bilirubin Total: 0.8 mg/dL (ref 0.0–1.2)
Bilirubin, Direct: 0.19 mg/dL (ref 0.00–0.40)
Total Protein: 6.9 g/dL (ref 6.0–8.5)

## 2019-04-17 LAB — LIPID PANEL
Chol/HDL Ratio: 3.6 ratio (ref 0.0–5.0)
Cholesterol, Total: 133 mg/dL (ref 100–199)
HDL: 37 mg/dL — ABNORMAL LOW (ref >39)
LDL Chol Calc (NIH): 66 mg/dL (ref 0–99)
Triglycerides: 178 mg/dL — ABNORMAL HIGH (ref 0–149)
VLDL Cholesterol Cal: 30 mg/dL (ref 5–40)

## 2019-04-29 ENCOUNTER — Other Ambulatory Visit: Payer: Self-pay | Admitting: Cardiovascular Disease

## 2019-04-29 DIAGNOSIS — I10 Essential (primary) hypertension: Secondary | ICD-10-CM

## 2019-04-29 DIAGNOSIS — I252 Old myocardial infarction: Secondary | ICD-10-CM

## 2019-06-03 ENCOUNTER — Telehealth: Payer: Self-pay | Admitting: Cardiovascular Disease

## 2019-06-03 DIAGNOSIS — I10 Essential (primary) hypertension: Secondary | ICD-10-CM

## 2019-06-03 DIAGNOSIS — I252 Old myocardial infarction: Secondary | ICD-10-CM

## 2019-06-03 MED ORDER — NITROGLYCERIN 0.4 MG SL SUBL: 0.4000 mg | SUBLINGUAL_TABLET | SUBLINGUAL | 2 refills | Status: DC | PRN

## 2019-06-03 NOTE — Telephone Encounter (Signed)
 *  STAT* If patient is at the pharmacy, call can be transferred to refill team.   1. Which medications need to be refilled? (please list name of each medication and dose if known) nitroGLYCERIN (NITROSTAT) 0.4 MG SL tablet  2. Which pharmacy/location (including street and city if local pharmacy) is medication to be sent to? [facility]   3. Do they need a 30 day or 90 day supply? [place]

## 2019-06-03 NOTE — Telephone Encounter (Signed)
[facility].

## 2019-07-11 ENCOUNTER — Other Ambulatory Visit: Payer: Self-pay | Admitting: Cardiovascular Disease

## 2019-07-11 DIAGNOSIS — I252 Old myocardial infarction: Secondary | ICD-10-CM

## 2019-07-11 DIAGNOSIS — I10 Essential (primary) hypertension: Secondary | ICD-10-CM

## 2019-08-05 ENCOUNTER — Ambulatory Visit: Payer: Commercial Managed Care - PPO | Admitting: Cardiovascular Disease

## 2019-08-30 ENCOUNTER — Other Ambulatory Visit: Payer: Self-pay

## 2019-08-30 ENCOUNTER — Telehealth: Payer: Self-pay

## 2019-08-30 ENCOUNTER — Ambulatory Visit: Payer: Commercial Managed Care - PPO | Admitting: Cardiovascular Disease

## 2019-08-30 ENCOUNTER — Encounter: Payer: Self-pay | Admitting: Cardiovascular Disease

## 2019-08-30 VITALS — BP 138/88 | HR 73 | Ht 72.0 in | Wt 233.6 lb

## 2019-08-30 DIAGNOSIS — E782 Mixed hyperlipidemia: Secondary | ICD-10-CM | POA: Diagnosis not present

## 2019-08-30 DIAGNOSIS — I2510 Atherosclerotic heart disease of native coronary artery without angina pectoris: Secondary | ICD-10-CM | POA: Diagnosis not present

## 2019-08-30 NOTE — Patient Instructions (Signed)
Medication Instructions:  1) STOP EFFIENT Dec 23, 2019 2) START PLAVIX 75 mg daily December 24, 2019 *If you need a refill on your cardiac medications before your next appointment, please call your pharmacy*  Follow-Up: At [facility], you and your health needs are our priority.  As part of our continuing mission to provide you with exceptional heart care, we have created designated Provider Care Teams.  These Care Teams include your primary Cardiologist (physician) and Advanced Practice Providers (APPs -  Physician Assistants and Nurse Practitioners) who all work together to provide you with the care you need, when you need it. Your next appointment:   12 month(s) The format for your next appointment:   In Person Provider:   You may see Tonny Bollman, MD or one of the following Advanced Practice Providers on your designated Care Team:    Tereso Newcomer, PA-C  Vin [place], [place]  [place]erton Bon, [place]

## 2019-08-30 NOTE — Telephone Encounter (Signed)
The patient will change from Effient to Plavix 75 mg daily December 24, 2019. Will call the patient mid May as reminder and to update medication list.

## 2019-08-30 NOTE — Progress Notes (Signed)
Cardiology Office Note:    Date:  08/30/2019   ID:  Mario Underwood, DOB 25-Apr-1961, MRN [id number]  PCP:  Patient, No Pcp Per  Cardiologist:  Sherren Mocha, MD  Electrophysiologist:  None   Referring MD: No ref. provider found   Chief Complaint  Patient presents with  . Coronary Artery Disease    History of Present Illness:    Mario Underwood is a 59 y.o. male with a hx of coronary artery disease, presenting for follow-up evaluation.  The patient initially presented in 2012 with an acute inferior wall MI treated with drug-eluting stent implantation in the right coronary artery, followed by staged PCI to proximal LAD.  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  He was noted to have very late stent thrombosis of the RCA.  He was treated with multiple overlapping drug-eluting stents.  The LAD stent was noted to be patent.  The patient continues to do well.  He notes easy bleeding on a combination of aspirin and Effient.  He denies chest pain, shortness of breath, leg swelling, orthopnea, PND, or heart palpitations.  He has a lot of questions about the COVID-19 vaccine today.  Past Medical History:  Diagnosis Date  . Asthma    once.week  . CAD (coronary artery disease)    A.  Inferior STEMI 12/12/10, treated with a Xience DES;  B.  Staged PCI of the LAD with a Promus DES;  C.  EF 55%, inferior AK  . Hyperlipemia   . Hypertension   . Myocardial infarction [facility]) 2012   2 stents placed  . Osteoarthritis    Right knee TKR pending with Dr. Alvan Dame  . Pneumonia     Past Surgical History:  Procedure Laterality Date  . ARTHROSCOPIC REPAIR ACL Right   . CORONARY STENT PLACEMENT  2012  . HERNIA REPAIR     left inguinal  . TOTAL KNEE ARTHROPLASTY Right 10/07/2013   Procedure: RIGHT TOTAL KNEE ARTHROPLASTY;  Surgeon: Vickey Huger, MD;  Location: [place];  Service: Orthopedics;  Laterality: Right;  . TOTAL KNEE  ARTHROPLASTY Left 09/25/2017   Procedure: LEFT TOTAL KNEE ARTHROPLASTY;  Surgeon: Vickey Huger, MD;  Location: [place];  Service: Orthopedics;  Laterality: Left;  . urology procedure      Current Medications: Current Meds  Medication Sig  . albuterol (PROVENTIL HFA;VENTOLIN HFA) 108 (90 BASE) MCG/ACT inhaler Inhale 1 puff into the lungs 2 (two) times daily as needed for wheezing.  Marland Kitchen aspirin EC 81 MG tablet Take 81 mg by mouth daily.  Marland Kitchen atorvastatin (LIPITOR) 80 MG tablet Take 1 tablet (80 mg total) by mouth daily.  . Glucosamine-Chondroitin-MSM 500-200-150 MG TABS Take 1 tablet by mouth daily.   Marland Kitchen lisinopril (ZESTRIL) 10 MG tablet TAKE 1 TABLET BY MOUTH EVERY DAY  . metoprolol tartrate (LOPRESSOR) 25 MG tablet TAKE 1 TABLET BY MOUTH TWICE A DAY  . Multiple Vitamin (DAILY MULTIVITAMIN PO) Take 1 tablet by mouth daily.   . nitroGLYCERIN (NITROSTAT) 0.4 MG SL tablet Place 1 tablet (0.4 mg total) under the tongue every 5 (five) minutes as needed for chest pain.  . Omega-3 Fatty Acids (FISH OIL PO) Take 1 capsule by mouth daily.  . prasugrel (EFFIENT) 10 MG TABS tablet Take 1 tablet (10 mg total) by mouth daily.     Allergies:   Penicillins   Social History   Socioeconomic History  . Marital  status: Married    Spouse name: Not on file  . Number of children: Not on file  . Years of education: Not on file  . Highest education level: Not on file  Occupational History  . Not on file  Tobacco Use  . Smoking status: Never Smoker  . Smokeless tobacco: Current User    Types: Chew  Substance and Sexual Activity  . Alcohol use: Yes    Comment: weekends  . Drug use: No  . Sexual activity: Yes  Other Topics Concern  . Not on file  Social History Narrative  . Not on file   Social Determinants of Health   Financial Resource Strain:   . Difficulty of Paying Living Expenses: Not on file  Food Insecurity:   . Worried About Programme researcher, broadcasting/film/video in the Last Year: Not on file  . Ran Out of Food  in the Last Year: Not on file  Transportation Needs:   . Lack of Transportation (Medical): Not on file  . Lack of Transportation (Non-Medical): Not on file  Physical Activity:   . Days of Exercise per Week: Not on file  . Minutes of Exercise per Session: Not on file  Stress:   . Feeling of Stress : Not on file  Social Connections:   . Frequency of Communication with Friends and Family: Not on file  . Frequency of Social Gatherings with Friends and Family: Not on file  . Attends Religious Services: Not on file  . Active Member of Clubs or Organizations: Not on file  . Attends Banker Meetings: Not on file  . Marital Status: Not on file     Family History: The patient's family history includes Coronary artery disease in his sister; Heart disease in his mother.  ROS:   Please see the history of present illness.    All other systems reviewed and are negative.  EKGs/Labs/Other Studies Reviewed:    EKG:  EKG is ordered today.  The ekg ordered today demonstrates normal sinus rhythm 73 bpm, age-indeterminate inferior MI, otherwise normal.  Recent Labs: 01/09/2019: BUN 8; Creatinine, Ser 0.82; Potassium 4.1; Sodium 142 04/17/2019: ALT 28  Recent Lipid Panel    Component Value Date/Time   CHOL 133 04/17/2019 1004   TRIG 178 (H) 04/17/2019 1004   HDL 37 (L) 04/17/2019 1004   CHOLHDL 3.6 04/17/2019 1004   CHOLHDL 3.4 02/19/2016 1021   VLDL 30 02/19/2016 1021   LDLCALC 66 04/17/2019 1004   LDLDIRECT  12/22/2010 0345    82 (NOTE) ATP III Classification (LDL):      < 100        mg/dL         Optimal     127 - 129     mg/dL         Near or Above Optimal     130 - 159     mg/dL         Borderline High     160 - 189     mg/dL         High      > 517        mg/dL         Very  High     Physical Exam:    VS:  BP 138/88   Pulse 73   Ht 6' (1.829 m)   Wt 233 lb 9.6 oz (106 kg)   SpO2 95%   BMI 31.68 kg/m  Wt Readings from Last 3 Encounters:  08/30/19 233 lb 9.6 oz  (106 kg)  01/09/19 224 lb 6.4 oz (101.8 kg)  05/11/18 232 lb (105.2 kg)     GEN:  Well nourished, well developed in no acute distress HEENT: Normal NECK: No JVD; No carotid bruits LYMPHATICS: No lymphadenopathy CARDIAC: RRR, no murmurs, rubs, gallops RESPIRATORY:  Clear to auscultation without rales, wheezing or rhonchi  ABDOMEN: Soft, non-tender, non-distended MUSCULOSKELETAL:  No edema; No deformity  SKIN: Warm and dry NEUROLOGIC:  Alert and oriented x 3 PSYCHIATRIC:  Normal affect   ASSESSMENT:    1. Coronary artery disease involving native coronary artery of native heart without angina pectoris   2. Mixed hyperlipidemia    PLAN:    In order of problems listed above:  1. BP on my recheck is 122/82 mmHg.  The patient appears to be doing well from a cardiac perspective.  I think he should remain on long-term dual antiplatelet therapy.  When he is out to 1 year from his most recent infarct (May 2021), I would recommend switching from Effient to clopidogrel.  He will remain on aspirin 81 mg daily.  We discussed pros and cons of the Covid vaccine today.  He plans on having this done as soon as he is eligible.  I think this is a good idea with his history of coronary disease and MI. 2. Most recent lipids reviewed.  He is treated with a high intensity statin drug.  We will repeat labs when he returns in 1 year for follow-up evaluation.   Medication Adjustments/Labs and Tests Ordered: Current medicines are reviewed at length with the patient today.  Concerns regarding medicines are outlined above.  Orders Placed This Encounter  Procedures  . EKG 12-Lead   No orders of the defined types were placed in this encounter.   Patient Instructions  Medication Instructions:  1) STOP EFFIENT Dec 23, 2019 2) START PLAVIX 75 mg daily December 24, 2019 *If you need a refill on your cardiac medications before your next appointment, please call your pharmacy*  Follow-Up: At [facility], you  and your health needs are our priority.  As part of our continuing mission to provide you with exceptional heart care, we have created designated Provider Care Teams.  These Care Teams include your primary Cardiologist (physician) and Advanced Practice Providers (APPs -  Physician Assistants and Nurse Practitioners) who all work together to provide you with the care you need, when you need it. Your next appointment:   12 month(s) The format for your next appointment:   In Person Provider:   You may see Tonny Bollman, MD or one of the following Advanced Practice Providers on your designated Care Team:    Tereso Newcomer, PA-C  Vin [place], PA-C  Berton Bon, [place]    Signed, Tonny Bollman, MD  08/30/2019 4:43 PM    [facility]

## 2019-12-12 MED ORDER — CLOPIDOGREL BISULFATE 75 MG PO TABS: 75.0000 mg | ORAL_TABLET | Freq: Every day | ORAL | 3 refills | Status: DC

## 2019-12-12 NOTE — Telephone Encounter (Signed)
Called and spoke with patient. Instructed him to finish his Effient (he has a couple weeks left) and then START PLAVIX 75 mg daily. He was grateful for call and agrees with treatment plan.

## 2019-12-12 NOTE — Addendum Note (Signed)
Addended by: Gunnar Fusi A on: 12/12/2019 03:53 PM   Modules accepted: Orders

## 2019-12-16 ENCOUNTER — Other Ambulatory Visit: Payer: Self-pay | Admitting: Cardiovascular Disease

## 2019-12-16 DIAGNOSIS — I252 Old myocardial infarction: Secondary | ICD-10-CM

## 2019-12-16 DIAGNOSIS — I10 Essential (primary) hypertension: Secondary | ICD-10-CM

## 2020-01-07 ENCOUNTER — Other Ambulatory Visit: Payer: Self-pay | Admitting: Cardiovascular Disease

## 2020-05-18 ENCOUNTER — Telehealth: Payer: Self-pay

## 2020-05-18 NOTE — Telephone Encounter (Signed)
The patient's wife reports he hasn't felt well since he had his Covid booster Friday, but his symptoms are improving. Reiterated to her to monitor his symptoms and call PCP/Urgent Care if symptoms do not subside.

## 2020-05-21 ENCOUNTER — Other Ambulatory Visit: Payer: Self-pay | Admitting: Cardiovascular Disease

## 2020-05-21 DIAGNOSIS — I10 Essential (primary) hypertension: Secondary | ICD-10-CM

## 2020-05-21 DIAGNOSIS — I252 Old myocardial infarction: Secondary | ICD-10-CM

## 2020-05-22 ENCOUNTER — Telehealth: Payer: Self-pay | Admitting: Cardiovascular Disease

## 2020-05-22 NOTE — Telephone Encounter (Signed)
Pt's wife called and is concerned due to Dr. Excell Seltzer not having any availability. Says she is going to send a message to Dr. Earmon [place] nurse. Really wants to speak with [facility]. Please call

## 2020-05-27 NOTE — Telephone Encounter (Signed)
Scheduled the patient for 1 year follow-up 08/31/2020 with Dr. Excell Seltzer. He was grateful for call and agrees with plan.

## 2020-08-22 ENCOUNTER — Other Ambulatory Visit: Payer: Self-pay | Admitting: Cardiovascular Disease

## 2020-08-22 DIAGNOSIS — I252 Old myocardial infarction: Secondary | ICD-10-CM

## 2020-08-22 DIAGNOSIS — I10 Essential (primary) hypertension: Secondary | ICD-10-CM

## 2020-08-31 ENCOUNTER — Ambulatory Visit: Payer: Commercial Managed Care - PPO | Admitting: Cardiovascular Disease

## 2020-08-31 ENCOUNTER — Encounter: Payer: Self-pay | Admitting: Cardiovascular Disease

## 2020-08-31 ENCOUNTER — Other Ambulatory Visit: Payer: Self-pay

## 2020-08-31 VITALS — BP 122/90 | HR 70 | Ht 72.0 in | Wt 236.4 lb

## 2020-08-31 DIAGNOSIS — I252 Old myocardial infarction: Secondary | ICD-10-CM | POA: Diagnosis not present

## 2020-08-31 DIAGNOSIS — E782 Mixed hyperlipidemia: Secondary | ICD-10-CM | POA: Diagnosis not present

## 2020-08-31 DIAGNOSIS — I2510 Atherosclerotic heart disease of native coronary artery without angina pectoris: Secondary | ICD-10-CM

## 2020-08-31 DIAGNOSIS — I10 Essential (primary) hypertension: Secondary | ICD-10-CM

## 2020-08-31 LAB — BASIC METABOLIC PANEL
BUN/Creatinine Ratio: 14 (ref 9–20)
BUN: 13 mg/dL (ref 6–24)
CO2: 23 mmol/L (ref 20–29)
Calcium: 9.5 mg/dL (ref 8.7–10.2)
Chloride: 102 mmol/L (ref 96–106)
Creatinine, Ser: 0.96 mg/dL (ref 0.76–1.27)
GFR calc Af Amer: 101 mL/min/{1.73_m2} (ref >59)
GFR calc non Af Amer: 87 mL/min/{1.73_m2} (ref >59)
Glucose: 93 mg/dL (ref 65–99)
Potassium: 4.6 mmol/L (ref 3.5–5.2)
Sodium: 139 mmol/L (ref 134–144)

## 2020-08-31 LAB — LIPID PANEL
Chol/HDL Ratio: 3.7 ratio (ref 0.0–5.0)
Cholesterol, Total: 170 mg/dL (ref 100–199)
HDL: 46 mg/dL (ref >39)
LDL Chol Calc (NIH): 87 mg/dL (ref 0–99)
Triglycerides: 219 mg/dL — ABNORMAL HIGH (ref 0–149)
VLDL Cholesterol Cal: 37 mg/dL (ref 5–40)

## 2020-08-31 LAB — CBC WITH DIFFERENTIAL/PLATELET
Basophils Absolute: 0.1 10*3/uL (ref 0.0–0.2)
Basos: 0 %
EOS (ABSOLUTE): 0.1 10*3/uL (ref 0.0–0.4)
Eos: 1 %
Hematocrit: 47.3 % (ref 37.5–51.0)
Hemoglobin: 16.1 g/dL (ref 13.0–17.7)
Immature Grans (Abs): 0 10*3/uL (ref 0.0–0.1)
Immature Granulocytes: 0 %
Lymphocytes Absolute: 2.6 10*3/uL (ref 0.7–3.1)
Lymphs: 23 %
MCH: 30.3 pg (ref 26.6–33.0)
MCHC: 34 g/dL (ref 31.5–35.7)
MCV: 89 fL (ref 79–97)
Monocytes Absolute: 0.8 10*3/uL (ref 0.1–0.9)
Monocytes: 7 %
Neutrophils Absolute: 7.6 10*3/uL — ABNORMAL HIGH (ref 1.4–7.0)
Neutrophils: 69 %
Platelets: 252 10*3/uL (ref 150–450)
RBC: 5.32 x10E6/uL (ref 4.14–5.80)
RDW: 13 % (ref 11.6–15.4)
WBC: 11.3 10*3/uL — ABNORMAL HIGH (ref 3.4–10.8)

## 2020-08-31 MED ORDER — ATORVASTATIN CALCIUM 80 MG PO TABS: 80.0000 mg | ORAL_TABLET | Freq: Every day | ORAL | 11 refills | Status: DC

## 2020-08-31 MED ORDER — LISINOPRIL 10 MG PO TABS: 10.0000 mg | ORAL_TABLET | Freq: Every day | ORAL | 11 refills | Status: DC

## 2020-08-31 MED ORDER — METOPROLOL TARTRATE 25 MG PO TABS: 25.0000 mg | ORAL_TABLET | Freq: Two times a day (BID) | ORAL | 11 refills | Status: DC

## 2020-08-31 MED ORDER — NITROGLYCERIN 0.4 MG SL SUBL: 0.4000 mg | SUBLINGUAL_TABLET | SUBLINGUAL | 2 refills | Status: DC | PRN

## 2020-08-31 MED ORDER — CLOPIDOGREL BISULFATE 75 MG PO TABS: 75.0000 mg | ORAL_TABLET | Freq: Every day | ORAL | 11 refills | Status: DC

## 2020-08-31 NOTE — Patient Instructions (Signed)
Medication Instructions:  Your provider recommends that you continue on your current medications as directed. Please refer to the Current Medication list given to you today.   *If you need a refill on your cardiac medications before your next appointment, please call your pharmacy*  Lab Work: TODAY! CBC, CMET, lipids If you have labs (blood work) drawn today and your tests are completely normal, you will receive your results only by: . MyChart Message (if you have MyChart) OR . A paper copy in the mail If you have any lab test that is abnormal or we need to change your treatment, we will call you to review the results.  Follow-Up: At [facility], you and your health needs are our priority.  As part of our continuing mission to provide you with exceptional heart care, we have created designated Provider Care Teams.  These Care Teams include your primary Cardiologist (physician) and Advanced Practice Providers (APPs -  Physician Assistants and Nurse Practitioners) who all work together to provide you with the care you need, when you need it. Your next appointment:   12 month(s) The format for your next appointment:   In Person Provider:   You may see Michael Cooper, MD or one of the following Advanced Practice Providers on your designated Care Team:    Scott Weaver, PA-C  Vin Bhagat, PA-C   

## 2020-08-31 NOTE — Progress Notes (Signed)
Cardiology Office Note:    Date:  08/31/2020   ID:  Mario Underwood, DOB [birth year], MRN [id number]  PCP:  Patient, No Pcp Per  [facility] Cardiologist:  Tonny Bollman, MD  [facility] Electrophysiologist:  None   Referring MD: No ref. provider found   Chief Complaint  Patient presents with  . Coronary Artery Disease    History of Present Illness:    Mario Underwood is a 60 y.o. male with a hx of coronary artery disease, presenting for follow-up evaluation.  The patient initially presented in 2012 with an acute inferior wall MI treated with drug-eluting stent implantation in the right coronary artery, followed by staged PCI to proximal LAD.  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  He was noted to have very late stent thrombosis of the RCA.  He was treated with multiple overlapping drug-eluting stents.  The LAD stent was noted to be patent.  The patient is here alone today.  He has been doing pretty well from a cardiac standpoint.  He is starting a diet as he has gained weight.  He remains physically active with no exertional symptoms.  He specifically denies chest pain, chest pressure, leg swelling, or heart palpitations.  He denies orthopnea or PND.  He is fatigued with activity but attributes this to being "out of shape."  He had a pretty strong reaction to his COVID-19 booster with body shaking and feeling poorly.  He has questions about whether to take another booster when the indication arises.  Past Medical History:  Diagnosis Date  . Asthma    once.week  . CAD (coronary artery disease)    A.  Inferior STEMI 12/12/10, treated with a Xience DES;  B.  Staged PCI of the LAD with a Promus DES;  C.  EF 55%, inferior AK  . Hyperlipemia   . Hypertension   . Myocardial infarction [facility]) 2012   2 stents placed  . Osteoarthritis    Right knee TKR pending with Dr. Charlann Boxer  . Pneumonia     Past  Surgical History:  Procedure Laterality Date  . ARTHROSCOPIC REPAIR ACL Right   . CORONARY STENT PLACEMENT  2012  . HERNIA REPAIR     left inguinal  . TOTAL KNEE ARTHROPLASTY Right 10/07/2013   Procedure: RIGHT TOTAL KNEE ARTHROPLASTY;  Surgeon: Dannielle Huh, MD;  Location: [facility] OR;  Service: Orthopedics;  Laterality: Right;  . TOTAL KNEE ARTHROPLASTY Left 09/25/2017   Procedure: LEFT TOTAL KNEE ARTHROPLASTY;  Surgeon: Dannielle Huh, MD;  Location: [facility] OR;  Service: Orthopedics;  Laterality: Left;  . urology procedure      Current Medications: Current Meds  Medication Sig  . albuterol (PROVENTIL HFA;VENTOLIN HFA) 108 (90 BASE) MCG/ACT inhaler Inhale 1 puff into the lungs 2 (two) times daily as needed for wheezing.  Marland Kitchen aspirin EC 81 MG tablet Take 81 mg by mouth daily.  . Glucosamine-Chondroitin-MSM 500-200-150 MG TABS Take 1 tablet by mouth daily.  . Multiple Vitamin (DAILY MULTIVITAMIN PO) Take 1 tablet by mouth daily.  . Omega-3 Fatty Acids (FISH OIL PO) Take 1 capsule by mouth daily.  . [DISCONTINUED] atorvastatin (LIPITOR) 80 MG tablet TAKE 1 TABLET BY MOUTH EVERY DAY  . [DISCONTINUED] clopidogrel (PLAVIX) 75 MG tablet Take 1 tablet (75 mg total) by mouth daily.  . [DISCONTINUED] lisinopril (ZESTRIL) 10 MG tablet TAKE 1 TABLET BY MOUTH EVERY DAY  . [DISCONTINUED]  metoprolol tartrate (LOPRESSOR) 25 MG tablet TAKE 1 TABLET BY MOUTH TWICE A DAY  . [DISCONTINUED] nitroGLYCERIN (NITROSTAT) 0.4 MG SL tablet Place 1 tablet (0.4 mg total) under the tongue every 5 (five) minutes as needed for chest pain.     Allergies:   Penicillins   Social History   Socioeconomic History  . Marital status: Married    Spouse name: Not on file  . Number of children: Not on file  . Years of education: Not on file  . Highest education level: Not on file  Occupational History  . Not on file  Tobacco Use  . Smoking status: Never Smoker  . Smokeless tobacco: Current User    Types: Chew  Vaping Use  . Vaping  Use: Never used  Substance and Sexual Activity  . Alcohol use: Yes    Comment: weekends  . Drug use: No  . Sexual activity: Yes  Other Topics Concern  . Not on file  Social History Narrative  . Not on file   Social Determinants of Health   Financial Resource Strain: Not on file  Food Insecurity: Not on file  Transportation Needs: Not on file  Physical Activity: Not on file  Stress: Not on file  Social Connections: Not on file     Family History: The patient's family history includes Coronary artery disease in his sister; Heart disease in his mother.  ROS:   Please see the history of present illness.    All other systems reviewed and are negative.  EKGs/Labs/Other Studies Reviewed:    EKG:  EKG is ordered today.  The ekg ordered today demonstrates NSR 70 bpm, old inferior MI, unchanged   Recent Labs: No results found for requested labs within last 8760 hours.  Recent Lipid Panel    Component Value Date/Time   CHOL 133 04/17/2019 1004   TRIG 178 (H) 04/17/2019 1004   HDL 37 (L) 04/17/2019 1004   CHOLHDL 3.6 04/17/2019 1004   CHOLHDL 3.4 02/19/2016 1021   VLDL 30 02/19/2016 1021   LDLCALC 66 04/17/2019 1004   LDLDIRECT  12/22/2010 0345    82 (NOTE) ATP III Classification (LDL):      < 100        mg/dL         Optimal     818 - 129     mg/dL         Near or Above Optimal     130 - 159     mg/dL         Borderline High     160 - 189     mg/dL         High      > 299        mg/dL         Very  High      Risk Assessment/Calculations:       Physical Exam:    VS:  BP 122/90   Pulse 70   Ht 6' (1.829 m)   Wt 236 lb 6.4 oz (107.2 kg)   SpO2 97%   BMI 32.06 kg/m     Wt Readings from Last 3 Encounters:  08/31/20 236 lb 6.4 oz (107.2 kg)  08/30/19 233 lb 9.6 oz (106 kg)  01/09/19 224 lb 6.4 oz (101.8 kg)     GEN: Well nourished, well developed in no acute distress HEENT: Normal NECK: No JVD; No carotid bruits LYMPHATICS: No lymphadenopathy CARDIAC: RRR, no  murmurs,  rubs, gallops RESPIRATORY:  Clear to auscultation without rales, wheezing or rhonchi  ABDOMEN: Soft, non-tender, non-distended MUSCULOSKELETAL:  No edema; No deformity  SKIN: Warm and dry NEUROLOGIC:  Alert and oriented x 3 PSYCHIATRIC:  Normal affect   ASSESSMENT:    1. Coronary artery disease involving native coronary artery of native heart without angina pectoris   2. Mixed hyperlipidemia   3. History of acute inferior wall MI   4. Essential hypertension    PLAN:    In order of problems listed above:  1. The patient is doing well on dual antiplatelet therapy with aspirin and clopidogrel, beta-blocker, and high intensity statin drug.  We discussed increasing efforts at exercise and weight loss which he is motivated to do.  His EKG is compared to old tracings with no change, specifically identifying an age-indeterminate inferior infarct. 2. Last labs reviewed from September 2020 when his LDL cholesterol was 66.  The patient is fasting today.  Will update lipids and LFTs. 3. Stable, see above under #1. 4. Blood pressure controlled on metoprolol and lisinopril.  We will check a metabolic panel today.  Medication Adjustments/Labs and Tests Ordered: Current medicines are reviewed at length with the patient today.  Concerns regarding medicines are outlined above.  Orders Placed This Encounter  Procedures  . Basic metabolic panel  . CBC with Differential/Platelet  . Lipid panel  . EKG 12-Lead   Meds ordered this encounter  Medications  . metoprolol tartrate (LOPRESSOR) 25 MG tablet    Sig: Take 1 tablet (25 mg total) by mouth 2 (two) times daily.    Dispense:  60 tablet    Refill:  11  . lisinopril (ZESTRIL) 10 MG tablet    Sig: Take 1 tablet (10 mg total) by mouth daily.    Dispense:  30 tablet    Refill:  11  . clopidogrel (PLAVIX) 75 MG tablet    Sig: Take 1 tablet (75 mg total) by mouth daily.    Dispense:  30 tablet    Refill:  11  . atorvastatin (LIPITOR) 80  MG tablet    Sig: Take 1 tablet (80 mg total) by mouth daily.    Dispense:  30 tablet    Refill:  11  . nitroGLYCERIN (NITROSTAT) 0.4 MG SL tablet    Sig: Place 1 tablet (0.4 mg total) under the tongue every 5 (five) minutes as needed for chest pain.    Dispense:  25 tablet    Refill:  2    Patient Instructions  Medication Instructions:  Your provider recommends that you continue on your current medications as directed. Please refer to the Current Medication list given to you today.   *If you need a refill on your cardiac medications before your next appointment, please call your pharmacy*  Lab Work: TODAY! CBC, CMET, lipids If you have labs (blood work) drawn today and your tests are completely normal, you will receive your results only by: Marland Kitchen MyChart Message (if you have MyChart) OR . A paper copy in the mail If you have any lab test that is abnormal or we need to change your treatment, we will call you to review the results.  Follow-Up: At [facility], you and your health needs are our priority.  As part of our continuing mission to provide you with exceptional heart care, we have created designated Provider Care Teams.  These Care Teams include your primary Cardiologist (physician) and Advanced Practice Providers (APPs -  Physician Assistants and Nurse  Practitioners) who all work together to provide you with the care you need, when you need it. Your next appointment:   12 month(s) The format for your next appointment:   In Person Provider:   You may see Tonny Bollman, MD or one of the following Advanced Practice Providers on your designated Care Team:    Tereso Newcomer, PA-C  Chelsea Aus, [place]      Signed, Tonny Bollman, MD  08/31/2020 1:11 PM    [facility]

## 2020-12-04 ENCOUNTER — Telehealth: Payer: Self-pay | Admitting: Cardiovascular Disease

## 2020-12-04 NOTE — Telephone Encounter (Signed)
*  STAT* If patient is at the pharmacy, call can be transferred to refill team.   1. Which medications need to be refilled? (please list name of each medication and dose if known) atorvastatin,  Lisinopril,  Metoprolol, clopidogrel, nirtoGLYCERIN    2. Which pharmacy/location (including street and city if local pharmacy) is medication to be sent to? [facility] [address]  3. Do they need a 30 day or 90 day supply?  Patient wife not sure

## 2020-12-04 NOTE — Telephone Encounter (Signed)
Returned call to pt and his wife has been made aware that all of pt's medication refills were sent in to [facility] [address] [place], [place] on his last appt in February.  She thanked me for the call back.

## 2020-12-25 DIAGNOSIS — E782 Mixed hyperlipidemia: Secondary | ICD-10-CM

## 2021-01-07 NOTE — Telephone Encounter (Signed)
Scheduled the patient 02/05/21 for repeat fasting blood work. The patient was grateful for call and agrees with plan.

## 2021-02-05 ENCOUNTER — Other Ambulatory Visit: Payer: Commercial Managed Care - PPO | Admitting: *Deleted

## 2021-02-05 ENCOUNTER — Other Ambulatory Visit: Payer: Self-pay

## 2021-02-05 DIAGNOSIS — E782 Mixed hyperlipidemia: Secondary | ICD-10-CM

## 2021-02-05 LAB — LIPID PANEL
Chol/HDL Ratio: 3.1 ratio (ref 0.0–5.0)
Cholesterol, Total: 118 mg/dL (ref 100–199)
HDL: 38 mg/dL — ABNORMAL LOW (ref >39)
LDL Chol Calc (NIH): 60 mg/dL (ref 0–99)
Triglycerides: 105 mg/dL (ref 0–149)
VLDL Cholesterol Cal: 20 mg/dL (ref 5–40)

## 2021-02-05 LAB — HEPATIC FUNCTION PANEL
ALT: 28 IU/L (ref 0–44)
AST: 26 IU/L (ref 0–40)
Albumin: 4.6 g/dL (ref 3.8–4.9)
Alkaline Phosphatase: 134 IU/L — ABNORMAL HIGH (ref 44–121)
Bilirubin Total: 0.5 mg/dL (ref 0.0–1.2)
Bilirubin, Direct: 0.17 mg/dL (ref 0.00–0.40)
Total Protein: 6.7 g/dL (ref 6.0–8.5)

## 2021-09-01 ENCOUNTER — Other Ambulatory Visit: Payer: Self-pay | Admitting: Cardiovascular Disease

## 2021-09-01 DIAGNOSIS — I10 Essential (primary) hypertension: Secondary | ICD-10-CM

## 2021-09-01 DIAGNOSIS — I252 Old myocardial infarction: Secondary | ICD-10-CM

## 2021-09-07 ENCOUNTER — Other Ambulatory Visit: Payer: Self-pay | Admitting: Cardiovascular Disease

## 2021-09-07 ENCOUNTER — Encounter: Payer: Self-pay | Admitting: Cardiovascular Disease

## 2021-09-07 DIAGNOSIS — I252 Old myocardial infarction: Secondary | ICD-10-CM

## 2021-09-07 DIAGNOSIS — I10 Essential (primary) hypertension: Secondary | ICD-10-CM

## 2021-09-07 MED ORDER — METOPROLOL TARTRATE 25 MG PO TABS: 25.0000 mg | ORAL_TABLET | Freq: Two times a day (BID) | ORAL | 0 refills | Status: DC

## 2021-09-07 MED ORDER — NITROGLYCERIN 0.4 MG SL SUBL: 0.4000 mg | SUBLINGUAL_TABLET | SUBLINGUAL | 0 refills | Status: DC | PRN

## 2021-09-07 MED ORDER — LISINOPRIL 10 MG PO TABS: 10.0000 mg | ORAL_TABLET | Freq: Every day | ORAL | 0 refills | Status: DC

## 2021-09-07 NOTE — Telephone Encounter (Signed)
Pt's pharmacy is requesting a refill on clopidogrel. This medication was D/C off pt's medication list. Does Dr. Excell Seltzer want the pt to still take this medication? Please address

## 2021-09-07 NOTE — Telephone Encounter (Signed)
Prescription for Clopidogrel had expired for patient. Per last OV notes by Dr Excell Seltzer, he does still intend on pt taking medication. Refill sent to pharmacy at this time. Scheduled f/u appt for first available in march. Pt aware.

## 2021-09-29 ENCOUNTER — Other Ambulatory Visit: Payer: Self-pay | Admitting: Cardiovascular Disease

## 2021-09-29 DIAGNOSIS — I252 Old myocardial infarction: Secondary | ICD-10-CM

## 2021-09-29 DIAGNOSIS — I10 Essential (primary) hypertension: Secondary | ICD-10-CM

## 2021-09-30 ENCOUNTER — Other Ambulatory Visit: Payer: Self-pay | Admitting: Cardiovascular Disease

## 2021-09-30 DIAGNOSIS — I10 Essential (primary) hypertension: Secondary | ICD-10-CM

## 2021-09-30 DIAGNOSIS — I252 Old myocardial infarction: Secondary | ICD-10-CM

## 2021-10-12 ENCOUNTER — Ambulatory Visit (INDEPENDENT_AMBULATORY_CARE_PROVIDER_SITE_OTHER): Payer: Commercial Managed Care - PPO | Admitting: Cardiovascular Disease

## 2021-10-12 ENCOUNTER — Encounter: Payer: Self-pay | Admitting: Cardiovascular Disease

## 2021-10-12 ENCOUNTER — Telehealth: Payer: Self-pay | Admitting: *Deleted

## 2021-10-12 ENCOUNTER — Other Ambulatory Visit: Payer: Self-pay

## 2021-10-12 VITALS — BP 110/90 | HR 80 | Ht 72.0 in | Wt 240.2 lb

## 2021-10-12 DIAGNOSIS — I252 Old myocardial infarction: Secondary | ICD-10-CM

## 2021-10-12 DIAGNOSIS — I2510 Atherosclerotic heart disease of native coronary artery without angina pectoris: Secondary | ICD-10-CM | POA: Diagnosis not present

## 2021-10-12 DIAGNOSIS — E782 Mixed hyperlipidemia: Secondary | ICD-10-CM | POA: Diagnosis not present

## 2021-10-12 DIAGNOSIS — I10 Essential (primary) hypertension: Secondary | ICD-10-CM | POA: Diagnosis not present

## 2021-10-12 DIAGNOSIS — G4719 Other hypersomnia: Secondary | ICD-10-CM

## 2021-10-12 MED ORDER — LISINOPRIL 10 MG PO TABS: 10.0000 mg | ORAL_TABLET | Freq: Every day | ORAL | 3 refills | Status: DC

## 2021-10-12 MED ORDER — ATORVASTATIN CALCIUM 80 MG PO TABS: 80.0000 mg | ORAL_TABLET | Freq: Every day | ORAL | 3 refills | Status: DC

## 2021-10-12 MED ORDER — CLOPIDOGREL BISULFATE 75 MG PO TABS: 75.0000 mg | ORAL_TABLET | Freq: Every day | ORAL | 3 refills | Status: DC

## 2021-10-12 MED ORDER — METOPROLOL TARTRATE 25 MG PO TABS: 25.0000 mg | ORAL_TABLET | Freq: Two times a day (BID) | ORAL | 3 refills | Status: DC

## 2021-10-12 NOTE — Patient Instructions (Signed)
Medication Instructions:  ?Your physician recommends that you continue on your current medications as directed. Please refer to the Current Medication list given to you today. ? ?*If you need a refill on your cardiac medications before your next appointment, please call your pharmacy* ? ? ?Lab Work: ?CBC, CMET, LIPIDS TODAY ?If you have labs (blood work) drawn today and your tests are completely normal, you will receive your results only by: ?MyChart Message (if you have MyChart) OR ?A paper copy in the mail ?If you have any lab test that is abnormal or we need to change your treatment, we will call you to review the results. ? ? ?Testing/Procedures: ?Itamar sleep study ? ?Follow-Up: ?At [facility], you and your health needs are our priority.  As part of our continuing mission to provide you with exceptional heart care, we have created designated Provider Care Teams.  These Care Teams include your primary Cardiologist (physician) and Advanced Practice Providers (APPs -  Physician Assistants and Nurse Practitioners) who all work together to provide you with the care you need, when you need it. ? ?Your next appointment:   ?1 year(s) ? ?The format for your next appointment:   ?In Person ? ?Provider:   ?Tonny Bollman, MD   ? ? ?  ?

## 2021-10-12 NOTE — Telephone Encounter (Signed)
Pt was seen in the office today by Dr. Burt Knack who ordered an Itamar study for the pt. I have helped the pt set up application on his phone. Pt has reviewed the instructional tutorial with verbal understanding. Pt aware to not open the box until we have called him back with a PIN#.  ? ?Set up date 10/12/21  ?

## 2021-10-12 NOTE — Progress Notes (Signed)
?Cardiology Office Note:   ? ?Date:  10/12/2021  ? ?ID:  Mario Underwood, DOB 1961-01-27, MRN [id number] ? ?PCP:  Patient, No Pcp Per (Inactive) ?  ?[facility] Providers ?Cardiologist:  Tonny Bollman, MD    ? ?Referring MD: No ref. provider found  ? ?Chief Complaint  ?Patient presents with  ? Coronary Artery Disease  ? ? ?History of Present Illness:   ? ?Mario Underwood is a 61 y.o. male with a hx of coronary artery disease, presenting for follow-up evaluation.  The patient initially presented in 2012 with an acute inferior wall MI treated with drug-eluting stent implantation in the right coronary artery, followed by staged PCI to proximal LAD.  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  He was noted to have very late stent thrombosis of the RCA.  He was treated with multiple overlapping drug-eluting stents.  The LAD stent was noted to be patent. ? ?Today, he denies symptoms of palpitations, chest pain, shortness of breath, orthopnea, PND, lower extremity edema, dizziness, or syncope.  He has been under a lot of stress.  His father has progressive decline, inability to walk, and remains in a nursing home.  He visits him every day.  He is also working long hours.  States that he is sleeping poorly with waking up frequently and not feeling rested in the mornings.  He still able to do hard physical work.  He likes to hunt.  States that he hikes through the snow in [place] this winter on his hunting trip and did not have any problems with that specifically denying any exertional symptoms. ? ? ?Past Medical History:  ?Diagnosis Date  ? Asthma   ? once.week  ? CAD (coronary artery disease)   ? A.  Inferior STEMI 12/12/10, treated with a Xience DES;  B.  Staged PCI of the LAD with a Promus DES;  C.  EF 55%, inferior AK  ? Hyperlipemia   ? Hypertension   ? Myocardial infarction [facility]) 2012  ? 2 stents placed  ? Osteoarthritis   ? Right knee  TKR pending with Dr. Charlann Boxer  ? Pneumonia   ? ? ?Past Surgical History:  ?Procedure Laterality Date  ? ARTHROSCOPIC REPAIR ACL Right   ? CORONARY STENT PLACEMENT  2012  ? HERNIA REPAIR    ? left inguinal  ? TOTAL KNEE ARTHROPLASTY Right 10/07/2013  ? Procedure: RIGHT TOTAL KNEE ARTHROPLASTY;  Surgeon: Dannielle Huh, MD;  Location: [facility] OR;  Service: Orthopedics;  Laterality: Right;  ? TOTAL KNEE ARTHROPLASTY Left 09/25/2017  ? Procedure: LEFT TOTAL KNEE ARTHROPLASTY;  Surgeon: Dannielle Huh, MD;  Location: [facility] OR;  Service: Orthopedics;  Laterality: Left;  ? urology procedure    ? ? ?Current Medications: ?Current Meds  ?Medication Sig  ? albuterol (PROVENTIL HFA;VENTOLIN HFA) 108 (90 BASE) MCG/ACT inhaler Inhale 1 puff into the lungs 2 (two) times daily as needed for wheezing.  ? aspirin EC 81 MG tablet Take 81 mg by mouth daily.  ? Glucosamine-Chondroitin-MSM 500-200-150 MG TABS Take 1 tablet by mouth daily.  ? Multiple Vitamin (DAILY MULTIVITAMIN PO) Take 1 tablet by mouth daily.  ? nitroGLYCERIN (NITROSTAT) 0.4 MG SL tablet Place 1 tablet (0.4 mg total) under the tongue every 5 (five) minutes as needed for chest pain.  ? Omega-3 Fatty Acids (FISH OIL PO) Take 1 capsule by mouth daily.  ? [DISCONTINUED] atorvastatin (LIPITOR) 80 MG tablet  Take 1 tablet (80 mg total) by mouth daily.  ? [DISCONTINUED] clopidogrel (PLAVIX) 75 MG tablet Take 1 tablet (75 mg total) by mouth daily.  ? [DISCONTINUED] lisinopril (ZESTRIL) 10 MG tablet TAKE 1 TABLET BY MOUTH EVERY DAY  ? [DISCONTINUED] metoprolol tartrate (LOPRESSOR) 25 MG tablet TAKE 1 TABLET BY MOUTH TWICE A DAY  ?  ? ?Allergies:   Penicillins  ? ?Social History  ? ?Socioeconomic History  ? Marital status: Married  ?  Spouse name: Not on file  ? Number of children: Not on file  ? Years of education: Not on file  ? Highest education level: Not on file  ?Occupational History  ? Not on file  ?Tobacco Use  ? Smoking status: Never  ? Smokeless tobacco: Current  ?  Types: Chew  ?Vaping  Use  ? Vaping Use: Never used  ?Substance and Sexual Activity  ? Alcohol use: Yes  ?  Comment: weekends  ? Drug use: No  ? Sexual activity: Yes  ?Other Topics Concern  ? Not on file  ?Social History Narrative  ? Not on file  ? ?Social Determinants of Health  ? ?Financial Resource Strain: Not on file  ?Food Insecurity: Not on file  ?Transportation Needs: Not on file  ?Physical Activity: Not on file  ?Stress: Not on file  ?Social Connections: Not on file  ?  ? ?Family History: ?The patient's family history includes Coronary artery disease in his sister; Heart disease in his mother. ? ?ROS:   ?Please see the history of present illness.    ?All other systems reviewed and are negative. ? ?EKGs/Labs/Other Studies Reviewed:   ? ? ?EKG:  EKG is ordered today.  The ekg ordered today demonstrates NSR 80 bpm, age-indeterminate inferior infarct ? ?Recent Labs: ?02/05/2021: ALT 28  ?Recent Lipid Panel ?   ?Component Value Date/Time  ? CHOL 118 02/05/2021 0805  ? TRIG 105 02/05/2021 0805  ? HDL 38 (L) 02/05/2021 0805  ? CHOLHDL 3.1 02/05/2021 0805  ? CHOLHDL 3.4 02/19/2016 1021  ? VLDL 30 02/19/2016 1021  ? LDLCALC 60 02/05/2021 0805  ? LDLDIRECT  12/22/2010 0345  ?  82 ?(NOTE) ATP III Classification (LDL):      < 100        mg/dL         Optimal     960100 - 129     mg/dL         Near or Above Optimal     130 - 159     mg/dL         Borderline High     160 - 189     mg/dL         High      > 454190        mg/dL         Very  ?High   ? ? ? ?Risk Assessment/Calculations:   ?  ? ?STOP-Bang Score:  5      ? ?Physical Exam:   ? ?VS:  BP 110/90   Pulse 80   Ht 6' (1.829 m)   Wt 240 lb 3.2 oz (109 kg)   SpO2 95%   BMI 32.58 kg/m?    ? ?Wt Readings from Last 3 Encounters:  ?10/12/21 240 lb 3.2 oz (109 kg)  ?08/31/20 236 lb 6.4 oz (107.2 kg)  ?08/30/19 233 lb 9.6 oz (106 kg)  ?  ? ?GEN:  Well nourished, well developed in no acute distress ?  HEENT: Normal ?NECK: No JVD; No carotid bruits ?LYMPHATICS: No lymphadenopathy ?CARDIAC: RRR, no  murmurs, rubs, gallops ?RESPIRATORY:  Clear to auscultation without rales, wheezing or rhonchi  ?ABDOMEN: Soft, non-tender, non-distended ?MUSCULOSKELETAL:  No edema; No deformity  ?SKIN: Warm and dry ?NEUROLOGIC:  Alert and oriented x 3 ?PSYCHIATRIC:  Normal affect  ? ?ASSESSMENT:   ? ?1. Coronary artery disease involving native coronary artery of native heart without angina pectoris   ?2. Essential hypertension   ?3. Mixed hyperlipidemia   ?4. History of acute inferior wall MI   ?5. Excessive daytime sleepiness   ? ?PLAN:   ? ?In order of problems listed above: ? ?Stable without symptoms of angina.  Continue current medical program.  Discussed lifestyle modification with an eye toward weight loss.  He understands the need to initiate an exercise program.  This is tough for him since he works 12-hour days and visits his father every night in a nursing home. ?Diastolic blood pressure borderline at 90 mmHg.  States that he is fighting allergies right now.  We will continue lisinopril and metoprolol.  We will hold off on escalating his therapy. ?Last lipids reviewed with LDL 60, total cholesterol 427, HDL 38, triglycerides 105.  LFTs were normal with normal transaminases.  Labs are reviewed today.  We will update his lab work as he is fasting today. ?As above, no symptoms ?Poor sleep, excessive daytime somnolence noted.  Patient at risk for obstructive sleep apnea.  Will order home sleep study. ? ?  ?Medication Adjustments/Labs and Tests Ordered: ?Current medicines are reviewed at length with the patient today.  Concerns regarding medicines are outlined above.  ?Orders Placed This Encounter  ?Procedures  ? CBC  ? Comprehensive metabolic panel  ? Lipid panel  ? EKG 12-Lead  ? Itamar Sleep Study  ? ?Meds ordered this encounter  ?Medications  ? atorvastatin (LIPITOR) 80 MG tablet  ?  Sig: Take 1 tablet (80 mg total) by mouth daily.  ?  Dispense:  90 tablet  ?  Refill:  3  ? clopidogrel (PLAVIX) 75 MG tablet  ?  Sig: Take  1 tablet (75 mg total) by mouth daily.  ?  Dispense:  90 tablet  ?  Refill:  3  ? lisinopril (ZESTRIL) 10 MG tablet  ?  Sig: Take 1 tablet (10 mg total) by mouth daily.  ?  Dispense:  90 tablet  ?  Refill:  3  ?

## 2021-10-13 LAB — LIPID PANEL
Chol/HDL Ratio: 3.4 ratio (ref 0.0–5.0)
Cholesterol, Total: 152 mg/dL (ref 100–199)
HDL: 45 mg/dL (ref >39)
LDL Chol Calc (NIH): 75 mg/dL (ref 0–99)
Triglycerides: 191 mg/dL — ABNORMAL HIGH (ref 0–149)
VLDL Cholesterol Cal: 32 mg/dL (ref 5–40)

## 2021-10-13 LAB — COMPREHENSIVE METABOLIC PANEL
ALT: 19 IU/L (ref 0–44)
AST: 16 IU/L (ref 0–40)
Albumin/Globulin Ratio: 1.8 (ref 1.2–2.2)
Albumin: 4.6 g/dL (ref 3.8–4.9)
Alkaline Phosphatase: 137 IU/L — ABNORMAL HIGH (ref 44–121)
BUN/Creatinine Ratio: 10 (ref 9–20)
BUN: 10 mg/dL (ref 6–24)
Bilirubin Total: 0.7 mg/dL (ref 0.0–1.2)
CO2: 21 mmol/L (ref 20–29)
Calcium: 9.6 mg/dL (ref 8.7–10.2)
Chloride: 103 mmol/L (ref 96–106)
Creatinine, Ser: 1 mg/dL (ref 0.76–1.27)
Globulin, Total: 2.5 g/dL (ref 1.5–4.5)
Glucose: 85 mg/dL (ref 70–99)
Potassium: 4.3 mmol/L (ref 3.5–5.2)
Sodium: 140 mmol/L (ref 134–144)
Total Protein: 7.1 g/dL (ref 6.0–8.5)
eGFR: 87 mL/min/{1.73_m2} (ref >59)

## 2021-10-13 LAB — CBC
Hematocrit: 47.7 % (ref 37.5–51.0)
Hemoglobin: 16.2 g/dL (ref 13.0–17.7)
MCH: 31.4 pg (ref 26.6–33.0)
MCHC: 34 g/dL (ref 31.5–35.7)
MCV: 92 fL (ref 79–97)
Platelets: 228 10*3/uL (ref 150–450)
RBC: 5.16 x10E6/uL (ref 4.14–5.80)
RDW: 13.1 % (ref 11.6–15.4)
WBC: 13.3 10*3/uL — ABNORMAL HIGH (ref 3.4–10.8)

## 2021-10-18 NOTE — Telephone Encounter (Signed)
Called and made the patient aware that he may proceed with the [facility] Sleep Study. PIN # [id number] provided to the patient. Patient made aware that he will be contacted after the test has been read with the results and any recommendations. Patient verbalized understanding and thanked me for the call.  ? ?Pt states he will do sleep study one night this week ?

## 2021-10-18 NOTE — Telephone Encounter (Signed)
PER [facility]  No Auth Required ?

## 2021-10-22 NOTE — Telephone Encounter (Signed)
Pt's wife called today and stated the pt tried to do the sleep study but kept getting an error message. They called the 800#  and was told the device was not registered. Was advised to call our office. I s/w the pt's wife and I did apologize that was our error. Pt's wife will come by and pick up a new box, due to the pt had already pulled the tab on the finger probe. This will require a new device. I will register the new device now. Pt's wife will drop off the other device today when she picks up the new box. I again apologized to the pt's wife for our error. Wife said that is ok. ?

## 2021-10-23 ENCOUNTER — Encounter (INDEPENDENT_AMBULATORY_CARE_PROVIDER_SITE_OTHER): Payer: Commercial Managed Care - PPO | Admitting: Cardiology

## 2021-10-23 DIAGNOSIS — G4733 Obstructive sleep apnea (adult) (pediatric): Secondary | ICD-10-CM

## 2021-10-25 ENCOUNTER — Encounter: Payer: Self-pay | Admitting: Cardiovascular Disease

## 2021-10-25 ENCOUNTER — Ambulatory Visit: Payer: Commercial Managed Care - PPO

## 2021-10-25 DIAGNOSIS — G4719 Other hypersomnia: Secondary | ICD-10-CM

## 2021-10-25 NOTE — Procedures (Signed)
? ?  SLEEP STUDY REPORT ?Patient Information ?Study Date: 10/23/21 ?Patient Name: Mario Underwood ?Patient ID: [id number] ?Birth Date: [birth year] ?Age: 61 ?Gender: Male ?BMI: 32.5 (W=240 lb, H=6' 0'') ?Neck Circ.: 17 '' ?Referring Physician: Sherren Mocha, MD ? ?TEST DESCRIPTION: Home sleep apnea testing was completed using the WatchPat, a Type 1 device, utilizing ?peripheral arterial tonometry (PAT), chest movement, actigraphy, pulse oximetry, pulse rate, body position and snore. ?AHI was calculated with apnea and hypopnea using valid sleep time as the denominator. RDI includes apneas, ?hypopneas, and RERAs. The data acquired and the scoring of sleep and all associated events were performed in ?accordance with the recommended standards and specifications as outlined in the AASM Manual for the Scoring of ?Sleep and Associated Events 2.2.0 (2015). ? ?FINDINGS: ?1. Mild Obstructive Sleep Apnea with AHI 6.1/hr. ?2. No Central apneas were present. ?3. Lowest oxygen saturation was 83%. ?4. Mild snoring was present. O2 sats were < 88% for 1.2 min. ?5. Total sleep time was 6hrs and 16 min. ?6. 22.6% of total sleep time was spent in REM sleep. ?7. Shortened sleep onset latency at 5 min. ?8. Shortened REM sleep onset latency at 56 min. ?9. Total awakenings were 11. ? ?DIAGNOSIS: ?Mild Obstructive Sleep Apnea (G47.33) ? ?RECOMMENDATIONS: ?1. Clinical correlation of these findings is necessary. The decision to treat obstructive sleep apnea (OSA) is usually ?based on the presence of apnea symptoms or the presence of associated medical conditions such as Hypertension, ?Congestive Heart Failure, Atrial Fibrillation or Obesity. The most common symptoms of OSA are snoring, gasping for ?breath while sleeping, daytime sleepiness and fatigue. ? ?2. Initiating apnea therapy is recommended given the presence of symptoms and/or associated conditions. ?Recommend proceeding with one of the following: ? ? a. Auto-CPAP therapy with a pressure  range of 5-20cm H2O. ? ? b. An oral appliance (OA) that can be obtained from certain dentists with expertise in sleep medicine. These are ?primarily of use in non-obese patients with mild and moderate disease. ? ? c. An ENT consultation which may be useful to look for specific causes of obstruction and possible treatment ?options. ? ? d. If patient is intolerant to PAP therapy, consider referral to ENT for evaluation for hypoglossal nerve stimulator. ? ?3. Close follow-up is necessary to ensure success with CPAP or oral appliance therapy for maximum benefit . ? ?4. A follow-up oximetry study on CPAP is recommended to assess the adequacy of therapy and determine the need ?for supplemental oxygen or the potential need for Bi-level therapy. An arterial blood gas to determine the adequacy of ?baseline ventilation and oxygenation should also be considered. ? ?5. Healthy sleep recommendations include: adequate nightly sleep (normal 7-9 hrs/night), avoidance of caffeine after ?noon and alcohol near bedtime, and maintaining a sleep environment that is cool, dark and quiet. ? ?6. Weight loss for overweight patients is recommended. Even modest amounts of weight loss can significantly ?improve the severity of sleep apnea. ? ?7. Snoring recommendations include: weight loss where appropriate, side sleeping, and avoidance of alcohol before ?bed. ? ?8. Operation of motor vehicle should be avoided when sleepy. ? ?Signature: ?Electronically Signed: 10/25/21 ?Fransico Him, MD; [facility]; [place], [place] Board of Sleep Medicine ? ?

## 2021-10-26 ENCOUNTER — Telehealth: Payer: Self-pay | Admitting: *Deleted

## 2021-10-26 NOTE — Telephone Encounter (Signed)
-----   Message from Gaynelle Cage, CMA sent at 10/26/2021  8:46 AM EDT ----- ? ?----- Message ----- ?From: Quintella Reichert, MD ?Sent: 10/25/2021   2:52 PM EDT ?To: [facility] Div Sleep Studies ? ?MInimal sleep apnea - set up virtual visit to discuss results ? ?

## 2021-10-26 NOTE — Telephone Encounter (Addendum)
The patient has been notified of the result and verbalized understanding.  All questions (if any) were answered. ?Marolyn Hammock, [place] 10/26/2021 11:42 PM   ? ?Message sent to [place] [facility] to make patient's appointment.  ?

## 2022-02-21 ENCOUNTER — Telehealth: Payer: Self-pay | Admitting: Cardiovascular Disease

## 2022-02-21 ENCOUNTER — Emergency Department (HOSPITAL_COMMUNITY): Payer: Commercial Managed Care - PPO

## 2022-02-21 ENCOUNTER — Other Ambulatory Visit: Payer: Self-pay

## 2022-02-21 ENCOUNTER — Encounter (HOSPITAL_COMMUNITY): Payer: Self-pay | Admitting: Emergency Medicine

## 2022-02-21 ENCOUNTER — Inpatient Hospital Stay (HOSPITAL_COMMUNITY)
Admission: EM | Admit: 2022-02-21 | Discharge: 2022-02-23 | DRG: 176 | Disposition: A | Discharge status: Home / Self Care | Payer: Commercial Managed Care - PPO | Attending: Family Medicine | Admitting: Family Medicine

## 2022-02-21 DIAGNOSIS — I82451 Acute embolism and thrombosis of right peroneal vein: Secondary | ICD-10-CM | POA: Diagnosis present

## 2022-02-21 DIAGNOSIS — Z955 Presence of coronary angioplasty implant and graft: Secondary | ICD-10-CM

## 2022-02-21 DIAGNOSIS — I110 Hypertensive heart disease with heart failure: Secondary | ICD-10-CM | POA: Diagnosis present

## 2022-02-21 DIAGNOSIS — E785 Hyperlipidemia, unspecified: Secondary | ICD-10-CM | POA: Diagnosis present

## 2022-02-21 DIAGNOSIS — Z7902 Long term (current) use of antithrombotics/antiplatelets: Secondary | ICD-10-CM

## 2022-02-21 DIAGNOSIS — Z880 Allergy status to penicillin: Secondary | ICD-10-CM | POA: Diagnosis not present

## 2022-02-21 DIAGNOSIS — I82431 Acute embolism and thrombosis of right popliteal vein: Secondary | ICD-10-CM | POA: Diagnosis present

## 2022-02-21 DIAGNOSIS — Z6832 Body mass index (BMI) 32.0-32.9, adult: Secondary | ICD-10-CM

## 2022-02-21 DIAGNOSIS — Z7982 Long term (current) use of aspirin: Secondary | ICD-10-CM

## 2022-02-21 DIAGNOSIS — Z8249 Family history of ischemic heart disease and other diseases of the circulatory system: Secondary | ICD-10-CM | POA: Diagnosis not present

## 2022-02-21 DIAGNOSIS — Z96651 Presence of right artificial knee joint: Secondary | ICD-10-CM | POA: Diagnosis present

## 2022-02-21 DIAGNOSIS — R0602 Shortness of breath: Secondary | ICD-10-CM

## 2022-02-21 DIAGNOSIS — I2699 Other pulmonary embolism without acute cor pulmonale: Secondary | ICD-10-CM | POA: Diagnosis present

## 2022-02-21 DIAGNOSIS — I25118 Atherosclerotic heart disease of native coronary artery with other forms of angina pectoris: Secondary | ICD-10-CM | POA: Diagnosis present

## 2022-02-21 DIAGNOSIS — Z8701 Personal history of pneumonia (recurrent): Secondary | ICD-10-CM

## 2022-02-21 DIAGNOSIS — M1990 Unspecified osteoarthritis, unspecified site: Secondary | ICD-10-CM | POA: Diagnosis present

## 2022-02-21 DIAGNOSIS — E669 Obesity, unspecified: Secondary | ICD-10-CM | POA: Diagnosis present

## 2022-02-21 DIAGNOSIS — Z79899 Other long term (current) drug therapy: Secondary | ICD-10-CM | POA: Diagnosis not present

## 2022-02-21 DIAGNOSIS — I252 Old myocardial infarction: Secondary | ICD-10-CM | POA: Diagnosis not present

## 2022-02-21 DIAGNOSIS — I10 Essential (primary) hypertension: Secondary | ICD-10-CM | POA: Diagnosis not present

## 2022-02-21 DIAGNOSIS — I5022 Chronic systolic (congestive) heart failure: Secondary | ICD-10-CM | POA: Diagnosis present

## 2022-02-21 DIAGNOSIS — I2510 Atherosclerotic heart disease of native coronary artery without angina pectoris: Secondary | ICD-10-CM | POA: Diagnosis not present

## 2022-02-21 DIAGNOSIS — I82401 Acute embolism and thrombosis of unspecified deep veins of right lower extremity: Secondary | ICD-10-CM | POA: Diagnosis not present

## 2022-02-21 DIAGNOSIS — I82409 Acute embolism and thrombosis of unspecified deep veins of unspecified lower extremity: Secondary | ICD-10-CM | POA: Diagnosis present

## 2022-02-21 LAB — CBC
HCT: 48.5 % (ref 39.0–52.0)
Hemoglobin: 15.8 g/dL (ref 13.0–17.0)
MCH: 31.3 pg (ref 26.0–34.0)
MCHC: 32.6 g/dL (ref 30.0–36.0)
MCV: 96.2 fL (ref 80.0–100.0)
Platelets: 216 10*3/uL (ref 150–400)
RBC: 5.04 MIL/uL (ref 4.22–5.81)
RDW: 13.7 % (ref 11.5–15.5)
WBC: 10.8 10*3/uL — ABNORMAL HIGH (ref 4.0–10.5)
nRBC: 0 % (ref 0.0–0.2)

## 2022-02-21 LAB — BASIC METABOLIC PANEL
Anion gap: 8 (ref 5–15)
BUN: 9 mg/dL (ref 6–20)
CO2: 26 mmol/L (ref 22–32)
Calcium: 9.6 mg/dL (ref 8.9–10.3)
Chloride: 105 mmol/L (ref 98–111)
Creatinine, Ser: 0.93 mg/dL (ref 0.61–1.24)
GFR, Estimated: >60 mL/min (ref >60)
Glucose, Bld: 100 mg/dL — ABNORMAL HIGH (ref 70–99)
Potassium: 4.7 mmol/L (ref 3.5–5.1)
Sodium: 139 mmol/L (ref 135–145)

## 2022-02-21 LAB — ECHOCARDIOGRAM COMPLETE
AR max vel: 5.03 cm2
AV Area VTI: 4.79 cm2
AV Area mean vel: 4.64 cm2
AV Mean grad: 2 mmHg
AV Peak grad: 3.7 mmHg
Ao pk vel: 0.96 m/s
Area-P 1/2: 2.9 cm2
Calc EF: 54.1 %
S' Lateral: 2 cm
Single Plane A2C EF: 56.9 %
Single Plane A4C EF: 55.4 %

## 2022-02-21 LAB — HEPARIN LEVEL (UNFRACTIONATED): Heparin Unfractionated: 0.37 IU/mL (ref 0.30–0.70)

## 2022-02-21 LAB — BRAIN NATRIURETIC PEPTIDE: B Natriuretic Peptide: 27.8 pg/mL (ref 0.0–100.0)

## 2022-02-21 LAB — TROPONIN I (HIGH SENSITIVITY)
Troponin I (High Sensitivity): 6 ng/L (ref <18)
Troponin I (High Sensitivity): 6 ng/L (ref <18)

## 2022-02-21 LAB — D-DIMER, QUANTITATIVE: D-Dimer, Quant: 2.08 ug/mL-FEU — ABNORMAL HIGH (ref 0.00–0.50)

## 2022-02-21 MED ORDER — LISINOPRIL 10 MG PO TABS
10.0000 mg | ORAL_TABLET | Freq: Every day | ORAL | Status: DC
  Administered 2022-02-22 – 2022-02-23 (×2): 10 mg via ORAL
  Filled 2022-02-21 (×2): qty 1

## 2022-02-21 MED ORDER — HEPARIN (PORCINE) 25000 UT/250ML-% IV SOLN
1600.0000 [IU]/h | INTRAVENOUS | Status: AC
  Administered 2022-02-21 – 2022-02-22 (×3): 1600 [IU]/h via INTRAVENOUS
  Filled 2022-02-21 (×3): qty 250

## 2022-02-21 MED ORDER — IOHEXOL 350 MG/ML SOLN
100.0000 mL | Freq: Once | INTRAVENOUS | Status: AC | PRN
  Administered 2022-02-21: 100 mL via INTRAVENOUS

## 2022-02-21 MED ORDER — HEPARIN BOLUS VIA INFUSION
6000.0000 [IU] | Freq: Once | INTRAVENOUS | Status: AC
  Administered 2022-02-21: 6000 [IU] via INTRAVENOUS
  Filled 2022-02-21: qty 6000

## 2022-02-21 MED ORDER — ALBUTEROL SULFATE (2.5 MG/3ML) 0.083% IN NEBU: 2.5000 mg | INHALATION_SOLUTION | Freq: Two times a day (BID) | RESPIRATORY_TRACT | Status: DC | PRN

## 2022-02-21 MED ORDER — ONDANSETRON HCL 4 MG/2ML IJ SOLN: 4.0000 mg | Freq: Four times a day (QID) | INTRAMUSCULAR | Status: DC | PRN

## 2022-02-21 MED ORDER — CLOPIDOGREL BISULFATE 75 MG PO TABS
75.0000 mg | ORAL_TABLET | Freq: Every day | ORAL | Status: DC
  Administered 2022-02-22 – 2022-02-23 (×2): 75 mg via ORAL
  Filled 2022-02-21 (×2): qty 1

## 2022-02-21 MED ORDER — OXYCODONE HCL 5 MG PO TABS: 5.0000 mg | ORAL_TABLET | ORAL | Status: DC | PRN

## 2022-02-21 MED ORDER — ONDANSETRON HCL 4 MG PO TABS: 4.0000 mg | ORAL_TABLET | Freq: Four times a day (QID) | ORAL | Status: DC | PRN

## 2022-02-21 MED ORDER — ATORVASTATIN CALCIUM 80 MG PO TABS
80.0000 mg | ORAL_TABLET | Freq: Every day | ORAL | Status: DC
  Administered 2022-02-21 – 2022-02-23 (×3): 80 mg via ORAL
  Filled 2022-02-21: qty 1
  Filled 2022-02-21: qty 2
  Filled 2022-02-21: qty 1

## 2022-02-21 MED ORDER — SODIUM CHLORIDE 0.9 % IV BOLUS
500.0000 mL | Freq: Once | INTRAVENOUS | Status: AC
  Administered 2022-02-21: 500 mL via INTRAVENOUS

## 2022-02-21 MED ORDER — METOPROLOL TARTRATE 25 MG PO TABS
25.0000 mg | ORAL_TABLET | Freq: Two times a day (BID) | ORAL | Status: DC
  Administered 2022-02-22 – 2022-02-23 (×4): 25 mg via ORAL
  Filled 2022-02-21 (×4): qty 1

## 2022-02-21 NOTE — ED Notes (Signed)
Attempted IV access x2, IV team consulted for placement

## 2022-02-21 NOTE — ED Notes (Signed)
ED TO INPATIENT HANDOFF REPORT  ED Nurse Name and Phone #: Morrie Sheldon RN  [phone]  S Name/Age/Gender Mario Underwood 61 y.o. male Room/Bed: 001C/001C  Code Status   Code Status: Full Code  Home/SNF/Other Home Patient oriented to: self, place, time, and situation Is this baseline? Yes   Triage Complete: Triage complete  Chief Complaint Pulmonary emboli [facility]) [I26.99]  Triage Note Pt complains of SOB since Friday. Denies CP, but feels like his heart is racing. Pt feels more sob with movement. Hx of MI and 6 stents with similar symptoms   Allergies Allergies  Allergen Reactions   Penicillins Hives, Swelling, Rash and Other (See Comments)    SWELLING REACTION UNSPECIFIED  PATIENT HAS HAD A PCN REACTION WITH IMMEDIATE RASH, FACIAL/TONGUE/THROAT SWELLING, SOB, OR LIGHTHEADEDNESS WITH HYPOTENSION:  #  #  #  YES  #  #  #   Has patient had a PCN reaction causing severe rash involving mucus membranes or skin necrosis: No Had a PCN reaction that required hospitalization:  #  #  #  NO , but Needed to go to MD's OFFICE # # # Has patient had a PCN reaction occurring within the last 10 years: No      Level of Care/Admitting Diagnosis ED Disposition     ED Disposition  Admit   Condition  --   Comment  Hospital Area: [facility] [[id number]]  Level of Care: Telemetry Medical [104]  May admit patient to [facility] or [facility] if equivalent level of care is available:: No  Covid Evaluation: Asymptomatic - no recent exposure (last 10 days) testing not required  Diagnosis: Pulmonary emboli [facility]) [[id number]]  Admitting Physician: Emeline General [[id number]]  Attending Physician: Emeline General [[id number]]  Certification:: I certify this patient will need inpatient services for at least 2 midnights  Estimated Length of Stay: 2          B Medical/Surgery History Past Medical History:  Diagnosis Date   Asthma    once.week   CAD (coronary artery disease)    A.  Inferior  STEMI 12/12/10, treated with a Xience DES;  B.  Staged PCI of the LAD with a Promus DES;  C.  EF 55%, inferior AK   Hyperlipemia    Hypertension    Myocardial infarction [facility]) 2012   2 stents placed   Osteoarthritis    Right knee TKR pending with Dr. Charlann Boxer   Pneumonia    Past Surgical History:  Procedure Laterality Date   ARTHROSCOPIC REPAIR ACL Right    CORONARY STENT PLACEMENT  2012   HERNIA REPAIR     left inguinal   TOTAL KNEE ARTHROPLASTY Right 10/07/2013   Procedure: RIGHT TOTAL KNEE ARTHROPLASTY;  Surgeon: Dannielle Huh, MD;  Location: [facility] OR;  Service: Orthopedics;  Laterality: Right;   TOTAL KNEE ARTHROPLASTY Left 09/25/2017   Procedure: LEFT TOTAL KNEE ARTHROPLASTY;  Surgeon: Dannielle Huh, MD;  Location: [facility] OR;  Service: Orthopedics;  Laterality: Left;   urology procedure       A IV Location/Drains/Wounds Patient Lines/Drains/Airways Status     Active Line/Drains/Airways     Name Placement date Placement time Site Days   Peripheral IV 02/21/22 20 G Left Antecubital 02/21/22  1349  Antecubital  less than 1   External Urinary Catheter 09/25/17  1237  --  1610   Incision (Closed) 09/25/17 Knee Left 09/25/17  0845  -- 1610  Intake/Output Last 24 hours  Intake/Output Summary (Last 24 hours) at 02/21/2022 1732 Last data filed at 02/21/2022 1617 Gross per 24 hour  Intake 500 ml  Output --  Net 500 ml    Labs/Imaging Results for orders placed or performed during the hospital encounter of 02/21/22 (from the past 48 hour(s))  Basic metabolic panel     Status: Abnormal   Collection Time: 02/21/22 10:19 AM  Result Value Ref Range   Sodium 139 135 - 145 mmol/L   Potassium 4.7 3.5 - 5.1 mmol/L    Comment: HEMOLYSIS AT THIS LEVEL MAY AFFECT RESULT   Chloride 105 98 - 111 mmol/L   CO2 26 22 - 32 mmol/L   Glucose, Bld 100 (H) 70 - 99 mg/dL    Comment: Glucose reference range applies only to samples taken after fasting for at least 8 hours.   BUN 9 6 - 20 mg/dL    Creatinine, Ser 0.93 0.61 - 1.24 mg/dL   Calcium 9.6 8.9 - 10.3 mg/dL   GFR, Estimated >60 >60 mL/min    Comment: (NOTE) Calculated using the CKD-EPI Creatinine Equation (2021)    Anion gap 8 5 - 15    Comment: Performed at [facility] [place] [address], [place], [place] [address]  CBC     Status: Abnormal   Collection Time: 02/21/22 10:19 AM  Result Value Ref Range   WBC 10.8 (H) 4.0 - 10.5 K/uL   RBC 5.04 4.22 - 5.81 MIL/uL   Hemoglobin 15.8 13.0 - 17.0 g/dL   HCT 48.5 39.0 - 52.0 %   MCV 96.2 80.0 - 100.0 fL   MCH 31.3 26.0 - 34.0 pg   MCHC 32.6 30.0 - 36.0 g/dL   RDW 13.7 11.5 - 15.5 %   Platelets 216 150 - 400 K/uL   nRBC 0.0 0.0 - 0.2 %    Comment: Performed at [facility], [place] [address], [place], [place] [address]  Troponin I (High Sensitivity)     Status: None   Collection Time: 02/21/22 10:19 AM  Result Value Ref Range   Troponin I (High Sensitivity) 6 <18 ng/L    Comment: (NOTE) Elevated high sensitivity troponin I (hsTnI) values and significant  changes across serial measurements may suggest ACS but many other  chronic and acute conditions are known to elevate hsTnI results.  Refer to the "Links" section for chest pain algorithms and additional  guidance. Performed at [facility], [place] [address] [place], [place] [address]   Brain natriuretic peptide     Status: None   Collection Time: 02/21/22 10:19 AM  Result Value Ref Range   B Natriuretic Peptide 27.8 0.0 - 100.0 pg/mL    Comment: Performed at [place] [address] [place], [place] [address]  Troponin I (High Sensitivity)     Status: None   Collection Time: 02/21/22 12:14 PM  Result Value Ref Range   Troponin I (High Sensitivity) 6 <18 ng/L    Comment: (NOTE) Elevated high sensitivity troponin I (hsTnI) values and significant  changes across serial measurements may suggest ACS but many other  chronic and acute conditions are known to elevate hsTnI results.  Refer to the  "Links" section for chest pain algorithms and additional  guidance. Performed at [facility], [name] [address], [place], [place] [address]   D-dimer, quantitative     Status: Abnormal   Collection Time: 02/21/22 12:27 PM  Result Value Ref  Range   D-Dimer, Quant 2.08 (H) 0.00 - 0.50 ug/mL-FEU    Comment: (NOTE) At the manufacturer cut-off value of 0.5 g/mL FEU, this assay has a negative predictive value of 95-100%.This assay is intended for use in conjunction with a clinical pretest probability (PTP) assessment model to exclude pulmonary embolism (PE) and deep venous thrombosis (DVT) in outpatients suspected of PE or DVT. Results should be correlated with clinical presentation. Performed at [facility], [place] [address], [place], [place] [address]    ECHOCARDIOGRAM COMPLETE  Result Date: 02/21/2022    ECHOCARDIOGRAM REPORT   Patient Name:   Mario Underwood Date of Exam: 02/21/2022 Medical Rec #:  [id number]       Height:       72.0 in Accession #:    [id number]      Weight:       240.2 lb Date of Birth:  01/13/1961        BSA:          2.303 m Patient Age:    67 years        BP:           134/87 mmHg Patient Gender: M               HR:           66 bpm. Exam Location:  Inpatient Procedure: 2D Echo, Cardiac Doppler and Color Doppler Indications:    Dyspnea  History:        Patient has no prior history of Echocardiogram examinations. CAD                 and Previous Myocardial Infarction, Signs/Symptoms:Shortness of                 Breath; Risk Factors:Hypertension and Dyslipidemia. DOE.  Sonographer:    Clayton Lefort RDCS (AE) Referring Phys: [id number] [place]  1. Left ventricular ejection fraction, by estimation, is 45 to 50%. The left ventricle has mildly decreased function. The left ventricle demonstrates regional wall motion abnormalities (see scoring diagram/findings for description). There is moderate left ventricular hypertrophy. Left ventricular diastolic parameters  are consistent with Grade II diastolic dysfunction (pseudonormalization).  2. Right ventricular systolic function is normal. The right ventricular size is normal.  3. Left atrial size was moderately dilated.  4. The mitral valve is normal in structure. Trivial mitral valve regurgitation. No evidence of mitral stenosis.  5. The aortic valve is normal in structure. Aortic valve regurgitation is not visualized. No aortic stenosis is present.  6. The inferior vena cava is normal in size with greater than 50% respiratory variability, suggesting right atrial pressure of 3 mmHg. FINDINGS  Left Ventricle: Left ventricular ejection fraction, by estimation, is 45 to 50%. The left ventricle has mildly decreased function. The left ventricle demonstrates regional wall motion abnormalities. The left ventricular internal cavity size was normal in size. There is moderate left ventricular hypertrophy. Left ventricular diastolic parameters are consistent with Grade II diastolic dysfunction (pseudonormalization).  LV Wall Scoring: The basal anteroseptal segment is dyskinetic. Right Ventricle: The right ventricular size is normal. No increase in right ventricular wall thickness. Right ventricular systolic function is normal. Left Atrium: Left atrial size was moderately dilated. Right Atrium: Right atrial size was normal in size. Pericardium: There is no evidence of pericardial effusion. Mitral Valve: The mitral valve is normal in structure. Trivial mitral valve regurgitation. No evidence of mitral valve stenosis. Tricuspid Valve: The tricuspid valve is  normal in structure. Tricuspid valve regurgitation is not demonstrated. No evidence of tricuspid stenosis. Aortic Valve: The aortic valve is normal in structure. Aortic valve regurgitation is not visualized. No aortic stenosis is present. Aortic valve mean gradient measures 2.0 mmHg. Aortic valve peak gradient measures 3.7 mmHg. Aortic valve area, by VTI measures 4.79 cm. Pulmonic  Valve: The pulmonic valve was normal in structure. Pulmonic valve regurgitation is not visualized. No evidence of pulmonic stenosis. Aorta: The aortic root is normal in size and structure. Venous: The inferior vena cava is normal in size with greater than 50% respiratory variability, suggesting right atrial pressure of 3 mmHg. IAS/Shunts: No atrial level shunt detected by color flow Doppler.  LEFT VENTRICLE PLAX 2D LVIDd:         2.70 cm      Diastology LVIDs:         2.00 cm      LV e' medial:    8.16 cm/s LV PW:         1.50 cm      LV E/e' medial:  9.7 LV IVS:        1.50 cm      LV e' lateral:   17.80 cm/s LVOT diam:     2.50 cm      LV E/e' lateral: 4.5 LV SV:         98 LV SV Index:   43 LVOT Area:     4.91 cm  LV Volumes (MOD) LV vol d, MOD A2C: 136.0 ml LV vol d, MOD A4C: 155.0 ml LV vol s, MOD A2C: 58.6 ml LV vol s, MOD A4C: 69.2 ml LV SV MOD A2C:     77.4 ml LV SV MOD A4C:     155.0 ml LV SV MOD BP:      78.6 ml RIGHT VENTRICLE             IVC RV Basal diam:  2.50 cm     IVC diam: 1.30 cm RV S prime:     15.60 cm/s TAPSE (M-mode): 2.1 cm LEFT ATRIUM             Index        RIGHT ATRIUM           Index LA diam:        4.40 cm 1.91 cm/m   RA Area:     14.40 cm LA Vol (A2C):   82.3 ml 35.74 ml/m  RA Volume:   28.50 ml  12.38 ml/m LA Vol (A4C):   96.9 ml 42.08 ml/m LA Biplane Vol: 93.4 ml 40.56 ml/m  AORTIC VALVE AV Area (Vmax):    5.03 cm AV Area (Vmean):   4.64 cm AV Area (VTI):     4.79 cm AV Vmax:           96.20 cm/s AV Vmean:          62.900 cm/s AV VTI:            0.205 m AV Peak Grad:      3.7 mmHg AV Mean Grad:      2.0 mmHg LVOT Vmax:         98.50 cm/s LVOT Vmean:        59.500 cm/s LVOT VTI:          0.200 m LVOT/AV VTI ratio: 0.98  AORTA Ao Root diam: 3.80 cm Ao Asc diam:  3.90 cm MITRAL VALVE MV Area (PHT): 2.90 cm  SHUNTS MV Decel Time: 262 msec    Systemic VTI:  0.20 m MV E velocity: 79.50 cm/s  Systemic Diam: 2.50 cm MV A velocity: 59.90 cm/s MV E/A ratio:  1.33 Donato Schultz MD  Electronically signed by Donato Schultz MD Signature Date/Time: 02/21/2022/4:31:30 PM    Final    CT Angio Chest PE W and/or Wo Contrast  Result Date: 02/21/2022 CLINICAL DATA:  Shortness of breath, positive D-dimer EXAM: CT ANGIOGRAPHY CHEST WITH CONTRAST TECHNIQUE: Multidetector CT imaging of the chest was performed using the standard protocol during bolus administration of intravenous contrast. Multiplanar CT image reconstructions and MIPs were obtained to evaluate the vascular anatomy. RADIATION DOSE REDUCTION: This exam was performed according to the departmental dose-optimization program which includes automated exposure control, adjustment of the mA and/or kV according to patient size and/or use of iterative reconstruction technique. CONTRAST:  OMNIPAQUE IOHEXOL 350 MG/ML SOLN COMPARISON:  None Available. FINDINGS: Cardiovascular: Filling defects in the right pulmonary arteries as proximally as the distal right main pulmonary artery (series 7, image 119 and series 8, image 77), extending into the right upper and lower lobar pulmonary arteries, with occlusive thrombus in the right lower lobe segmental arteries. The right middle lobe pulmonary arteries appear patent. No definite filling defects in the left upper and lower lobe pulmonary arteries. The main pulmonary artery is within normal limits for caliber. Reflux of contrast into the hepatic veins no evidence of right ventricular enlargement, with a normal RV: LV ratio. No pericardial effusion. Coronary artery calcifications. Aortic atherosclerotic calcifications. Mediastinum/Nodes: No enlarged mediastinal, hilar, or axillary lymph nodes. Thyroid gland, trachea, and esophagus demonstrate no significant findings. Lungs/Pleura: No focal pulmonary opacity, pneumothorax, or pleural effusion. Upper Abdomen: No acute abnormality. Musculoskeletal: No acute osseous abnormality. Review of the MIP images confirms the above findings. IMPRESSION: 1. Pulmonary  emboli as proximally as the distal right main pulmonary artery, with thrombus seen in the right upper and lower lobar and segmental arteries, which is occlusive in the right lower lobe segmental arteries. 2. Reflux of contrast into the hepatic veins, raising concern for right heart strain, although there is no evidence of right ventricular enlargement and a normal RV: LV ratio. 3. Coronary artery calcifications. Aortic Atherosclerosis (ICD10-I70.0). These results were called by telephone at the time of interpretation on 02/21/2022 at 3:55 pm to provider [facility] , who verbally acknowledged these results. Electronically Signed   By: Wiliam Ke M.D.   On: 02/21/2022 15:57   DG Chest 2 View  Result Date: 02/21/2022 CLINICAL DATA:  Shortness of breath EXAM: CHEST - 2 VIEW COMPARISON:  09/30/2013 FINDINGS: Cardiomediastinal silhouette is within normal limits. There is no focal airspace consolidation. There is no pleural effusion. No pneumothorax. There is no acute osseous abnormality. Bilateral shoulder degenerative changes with probable ossified joint bodies along the inferior aspect of the coracoid on the left, similar to prior. IMPRESSION: No evidence of acute cardiopulmonary disease. Electronically Signed   By: Caprice Renshaw M.D.   On: 02/21/2022 10:46    Pending Labs Unresulted Labs (From admission, onward)     Start     Ordered   02/22/22 0500  Heparin level (unfractionated)  Daily at 5am,   R     See Hyperspace for full Linked Orders Report.   02/21/22 1621   02/22/22 0500  CBC  Daily at 5am,   R     See Hyperspace for full Linked Orders Report.   02/21/22 1621   02/21/22 2300  Heparin level (unfractionated)  Once-Timed,   URGENT        02/21/22 1621   02/21/22 1656  HIV Antibody (routine testing w rflx)  (HIV Antibody (Routine testing w reflex) panel)  Once,   R        02/21/22 1656            Vitals/Pain Today's Vitals   02/21/22 1621 02/21/22 1622 02/21/22 1649 02/21/22 1649   BP: (!) 160/92  (!) 144/95   Pulse:  64 62   Resp:  20 14   Temp:    98 F (36.7 C)  TempSrc:    Oral  SpO2:  99% 98%   PainSc:        Isolation Precautions No active isolations  Medications Medications  heparin ADULT infusion 100 units/mL (25000 units/236mL) (1,600 Units/hr Intravenous New Bag/Given 02/21/22 1647)  atorvastatin (LIPITOR) tablet 80 mg (has no administration in time range)  lisinopril (ZESTRIL) tablet 10 mg (has no administration in time range)  metoprolol tartrate (LOPRESSOR) tablet 25 mg (has no administration in time range)  clopidogrel (PLAVIX) tablet 75 mg (has no administration in time range)  albuterol (PROVENTIL) (2.5 MG/3ML) 0.083% nebulizer solution 2.5 mg (has no administration in time range)  ondansetron (ZOFRAN) tablet 4 mg (has no administration in time range)    Or  ondansetron (ZOFRAN) injection 4 mg (has no administration in time range)  oxyCODONE (Oxy IR/ROXICODONE) immediate release tablet 5 mg (has no administration in time range)  sodium chloride 0.9 % bolus 500 mL (0 mLs Intravenous Stopped 02/21/22 1617)  iohexol (OMNIPAQUE) 350 MG/ML injection 100 mL (100 mLs Intravenous Contrast Given 02/21/22 1541)  heparin bolus via infusion 6,000 Units (6,000 Units Intravenous Bolus from Bag 02/21/22 1647)    Mobility walks Low fall risk   Focused Assessments Pulmonary Assessment Handoff:  Lung sounds: Bilateral Breath Sounds: Clear L Breath Sounds: Clear R Breath Sounds: Clear O2 Device: Room Air      R Recommendations: See Admitting Provider Note  Report given to:   Additional Notes:

## 2022-02-21 NOTE — ED Triage Notes (Signed)
Pt complains of SOB since Friday. Denies CP, but feels like his heart is racing. Pt feels more sob with movement. Hx of MI and 6 stents with similar symptoms

## 2022-02-21 NOTE — Telephone Encounter (Signed)
Spoke with pt's wife who states she is on her way to pick up pt from work who is complaining of severe SOB.  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  Pt's wife states pt does not want to go to [facility] even though this is the closest hospital pt would prefer to come to [facility].  Pt's wife advised pt will need to go to the closest hospital if he is severely SOB or with active CP.  Pt's wife states she not able to assess pt to know where he should go.  She is advised it would be best then if pt's office call 911 so they can assess pt to determine if he can be safely transported to [facility].  Pt's wife verbalizes understanding and agrees with current plan.

## 2022-02-21 NOTE — Telephone Encounter (Signed)
Pt c/o Shortness Of Breath: STAT if SOB developed within the last 24 hours or pt is noticeably SOB on the phone  1. Are you currently SOB (can you hear that pt is SOB on the phone)? Yes  2. How long have you been experiencing SOB? Started this morning  3. Are you SOB when sitting or when up moving around? Not sure  4. Are you currently experiencing any other symptoms? Not sure  Pt's wife is calling stating the patient is having serious SOB at work where she is going to go and pick him up at. She states he recently had 6 stints put in and states she wants to take the pt to [facility], but would like Dr. Excell Seltzer to know. She also has questions in regards to care.

## 2022-02-21 NOTE — Progress Notes (Signed)
  Echocardiogram 2D Echocardiogram has been performed.  Gerda Diss 02/21/2022, 4:22 PM

## 2022-02-21 NOTE — H&P (Signed)
History and Physical    Mario Underwood:[id number] DOB: [birth year] DOA: 02/21/2022  PCP: Patient, No Pcp Per (Confirm with patient/family/NH records and if not entered, this has to be entered at [facility] point of entry) Patient coming from: Home  I have personally briefly reviewed patient's old medical records in [facility]  Chief Complaint: SOB  HPI: Mario Underwood is a 61 y.o. male with medical history significant of CAD with MI status post stenting, asthma, HTN, HLD, presented with increasing shortness of breath.  Denies any long distance travel, is non-smoker.  Starting from 4 days ago, patient developed increasing shortness of breath, exertional dyspnea, cough no chest pain.  Today, patient woke up with severe feeling of shortness of breath and started to feel palpitations again no chest pain.  Denies any lower extremity edema.  ED Course: No tachycardia no hypotension no hypoxia.  Chest x-ray no acute infiltrates.  D-dimer elevated, CT angiogram showed pulmonary emboli distal right main pulmonary artery and right upper lobe and segmental curious occlusive in the right lower lobe segmental arteries.  Reflux of contrast into hepatic vein concerning about right heart strain.  Troponin negative x2.  EKG no significant ST changes.  Review of Systems: As per HPI otherwise 14 point review of systems negative.    Past Medical History:  Diagnosis Date   Asthma    once.week   CAD (coronary artery disease)    A.  Inferior STEMI 12/12/10, treated with a Xience DES;  B.  Staged PCI of the LAD with a Promus DES;  C.  EF 55%, inferior AK   Hyperlipemia    Hypertension    Myocardial infarction [facility]) 2012   2 stents placed   Osteoarthritis    Right knee TKR pending with Dr. Charlann Boxer   Pneumonia     Past Surgical History:  Procedure Laterality Date   ARTHROSCOPIC REPAIR ACL Right    CORONARY STENT PLACEMENT  2012   HERNIA REPAIR     left inguinal   TOTAL KNEE ARTHROPLASTY Right 10/07/2013    Procedure: RIGHT TOTAL KNEE ARTHROPLASTY;  Surgeon: Dannielle Huh, MD;  Location: [facility] OR;  Service: Orthopedics;  Laterality: Right;   TOTAL KNEE ARTHROPLASTY Left 09/25/2017   Procedure: LEFT TOTAL KNEE ARTHROPLASTY;  Surgeon: Dannielle Huh, MD;  Location: [facility] OR;  Service: Orthopedics;  Laterality: Left;   urology procedure       reports that he has never smoked. His smokeless tobacco use includes chew. He reports current alcohol use. He reports that he does not use drugs.  Allergies  Allergen Reactions   Penicillins Hives, Swelling, Rash and Other (See Comments)    SWELLING REACTION UNSPECIFIED  PATIENT HAS HAD A PCN REACTION WITH IMMEDIATE RASH, FACIAL/TONGUE/THROAT SWELLING, SOB, OR LIGHTHEADEDNESS WITH HYPOTENSION:  #  #  #  YES  #  #  #   Has patient had a PCN reaction causing severe rash involving mucus membranes or skin necrosis: No Had a PCN reaction that required hospitalization:  #  #  #  NO , but Needed to go to MD[facility] OFFICE # # # Has patient had a PCN reaction occurring within the last 10 years: No      Family History  Problem Relation Age of Onset   Coronary artery disease Sister    Heart disease Mother      Prior to Admission medications   Medication Sig Start Date End Date Taking? Authorizing Provider  albuterol (PROVENTIL HFA;VENTOLIN HFA) 108 (  90 BASE) MCG/ACT inhaler Inhale 1 puff into the lungs 2 (two) times daily as needed for wheezing. 07/06/12  Yes Tonny Bollman, MD  aspirin EC 81 MG tablet Take 81 mg by mouth daily.   Yes [provider]  atorvastatin (LIPITOR) 80 MG tablet Take 1 tablet (80 mg total) by mouth daily. 10/12/21  Yes Tonny Bollman, MD  clopidogrel (PLAVIX) 75 MG tablet Take 1 tablet (75 mg total) by mouth daily. 10/12/21  Yes Tonny Bollman, MD  Glucosamine-Chondroitin-MSM 500-200-150 MG TABS Take 1 tablet by mouth daily.   Yes [provider]  lisinopril (ZESTRIL) 10 MG tablet Take 1 tablet (10 mg total) by mouth daily. 10/12/21   Yes Tonny Bollman, MD  metoprolol tartrate (LOPRESSOR) 25 MG tablet Take 1 tablet (25 mg total) by mouth 2 (two) times daily. 10/12/21  Yes Tonny Bollman, MD  Multiple Vitamin (DAILY MULTIVITAMIN PO) Take 1 tablet by mouth daily.   Yes [provider]  nitroGLYCERIN (NITROSTAT) 0.4 MG SL tablet Place 1 tablet (0.4 mg total) under the tongue every 5 (five) minutes as needed for chest pain. 09/07/21  Yes Tonny Bollman, MD  Omega-3 Fatty Acids (FISH OIL PO) Take 1 capsule by mouth daily.   Yes [provider]    Physical Exam: Vitals:   02/21/22 1621 02/21/22 1622 02/21/22 1649 02/21/22 1649  BP: (!) 160/92  (!) 144/95   Pulse:  64 62   Resp:  20 14   Temp:    98 F (36.7 C)  TempSrc:    Oral  SpO2:  99% 98%     Constitutional: NAD, calm, comfortable Vitals:   02/21/22 1621 02/21/22 1622 02/21/22 1649 02/21/22 1649  BP: (!) 160/92  (!) 144/95   Pulse:  64 62   Resp:  20 14   Temp:    98 F (36.7 C)  TempSrc:    Oral  SpO2:  99% 98%    Eyes: PERRL, lids and conjunctivae normal ENMT: Mucous membranes are moist. Posterior pharynx clear of any exudate or lesions.Normal dentition.  Neck: normal, supple, no masses, no thyromegaly Respiratory: clear to auscultation bilaterally, no wheezing, no crackles. Normal respiratory effort. No accessory muscle use.  Cardiovascular: Regular rate and rhythm, no murmurs / rubs / gallops. No extremity edema. 2+ pedal pulses. No carotid bruits.  Abdomen: no tenderness, no masses palpated. No hepatosplenomegaly. Bowel sounds positive.  Musculoskeletal: no clubbing / cyanosis. No joint deformity upper and lower extremities. Good ROM, no contractures. Normal muscle tone.  Skin: no rashes, lesions, ulcers. No induration Neurologic: CN 2-12 grossly intact. Sensation intact, DTR normal. Strength 5/5 in all 4.  Psychiatric: Normal judgment and insight. Alert and oriented x 3. Normal mood.     Labs on Admission: I have personally  reviewed following labs and imaging studies  CBC: Recent Labs  Lab 02/21/22 1019  WBC 10.8*  HGB 15.8  HCT 48.5  MCV 96.2  PLT 216   Basic Metabolic Panel: Recent Labs  Lab 02/21/22 1019  NA 139  K 4.7  CL 105  CO2 26  GLUCOSE 100*  BUN 9  CREATININE 0.93  CALCIUM 9.6   GFR: CrCl cannot be calculated (Unknown ideal weight.). Liver Function Tests: No results for input(s): "AST", "ALT", "ALKPHOS", "BILITOT", "PROT", "ALBUMIN" in the last 168 hours. No results for input(s): "LIPASE", "AMYLASE" in the last 168 hours. No results for input(s): "AMMONIA" in the last 168 hours. Coagulation Profile: No results for input(s): "INR", "PROTIME" in the  last 168 hours. Cardiac Enzymes: No results for input(s): "CKTOTAL", "CKMB", "CKMBINDEX", "TROPONINI" in the last 168 hours. BNP (last 3 results) No results for input(s): "PROBNP" in the last 8760 hours. HbA1C: No results for input(s): "HGBA1C" in the last 72 hours. CBG: No results for input(s): "GLUCAP" in the last 168 hours. Lipid Profile: No results for input(s): "CHOL", "HDL", "LDLCALC", "TRIG", "CHOLHDL", "LDLDIRECT" in the last 72 hours. Thyroid Function Tests: No results for input(s): "TSH", "T4TOTAL", "FREET4", "T3FREE", "THYROIDAB" in the last 72 hours. Anemia Panel: No results for input(s): "VITAMINB12", "FOLATE", "FERRITIN", "TIBC", "IRON", "RETICCTPCT" in the last 72 hours. Urine analysis:    Component Value Date/Time   COLORURINE YELLOW 09/30/2013 0818   APPEARANCEUR CLEAR 09/30/2013 0818   LABSPEC 1.027 09/30/2013 0818   PHURINE 5.5 09/30/2013 0818   GLUCOSEU NEGATIVE 09/30/2013 0818   HGBUR NEGATIVE 09/30/2013 0818   BILIRUBINUR NEGATIVE 09/30/2013 0818   KETONESUR NEGATIVE 09/30/2013 0818   PROTEINUR NEGATIVE 09/30/2013 0818   UROBILINOGEN 0.2 09/30/2013 0818   NITRITE NEGATIVE 09/30/2013 0818   LEUKOCYTESUR NEGATIVE 09/30/2013 0818    Radiological Exams on Admission: ECHOCARDIOGRAM COMPLETE  Result  Date: 02/21/2022    ECHOCARDIOGRAM REPORT   Patient Name:   DALTYN DEGROAT Date of Exam: 02/21/2022 Medical Rec #:  [id number]       Height:       72.0 in Accession #:    [id number]      Weight:       240.2 lb Date of Birth:  July 20, 1961        BSA:          2.303 m Patient Age:    61 years        BP:           134/87 mmHg Patient Gender: M               HR:           66 bpm. Exam Location:  Inpatient Procedure: 2D Echo, Cardiac Doppler and Color Doppler Indications:    Dyspnea  History:        Patient has no prior history of Echocardiogram examinations. CAD                 and Previous Myocardial Infarction, Signs/Symptoms:Shortness of                 Breath; Risk Factors:Hypertension and Dyslipidemia. DOE.  Sonographer:    Ross Ludwig RDCS (AE) Referring Phys: [id number] Ernie Avena IMPRESSIONS  1. Left ventricular ejection fraction, by estimation, is 45 to 50%. The left ventricle has mildly decreased function. The left ventricle demonstrates regional wall motion abnormalities (see scoring diagram/findings for description). There is moderate left ventricular hypertrophy. Left ventricular diastolic parameters are consistent with Grade II diastolic dysfunction (pseudonormalization).  2. Right ventricular systolic function is normal. The right ventricular size is normal.  3. Left atrial size was moderately dilated.  4. The mitral valve is normal in structure. Trivial mitral valve regurgitation. No evidence of mitral stenosis.  5. The aortic valve is normal in structure. Aortic valve regurgitation is not visualized. No aortic stenosis is present.  6. The inferior vena cava is normal in size with greater than 50% respiratory variability, suggesting right atrial pressure of 3 mmHg. FINDINGS  Left Ventricle: Left ventricular ejection fraction, by estimation, is 45 to 50%. The left ventricle has mildly decreased function. The left ventricle demonstrates regional wall motion abnormalities. The left ventricular internal cavity  size was normal in size. There is moderate left ventricular hypertrophy. Left ventricular diastolic parameters are consistent with Grade II diastolic dysfunction (pseudonormalization).  LV Wall Scoring: The basal anteroseptal segment is dyskinetic. Right Ventricle: The right ventricular size is normal. No increase in right ventricular wall thickness. Right ventricular systolic function is normal. Left Atrium: Left atrial size was moderately dilated. Right Atrium: Right atrial size was normal in size. Pericardium: There is no evidence of pericardial effusion. Mitral Valve: The mitral valve is normal in structure. Trivial mitral valve regurgitation. No evidence of mitral valve stenosis. Tricuspid Valve: The tricuspid valve is normal in structure. Tricuspid valve regurgitation is not demonstrated. No evidence of tricuspid stenosis. Aortic Valve: The aortic valve is normal in structure. Aortic valve regurgitation is not visualized. No aortic stenosis is present. Aortic valve mean gradient measures 2.0 mmHg. Aortic valve peak gradient measures 3.7 mmHg. Aortic valve area, by VTI measures 4.79 cm. Pulmonic Valve: The pulmonic valve was normal in structure. Pulmonic valve regurgitation is not visualized. No evidence of pulmonic stenosis. Aorta: The aortic root is normal in size and structure. Venous: The inferior vena cava is normal in size with greater than 50% respiratory variability, suggesting right atrial pressure of 3 mmHg. IAS/Shunts: No atrial level shunt detected by color flow Doppler.  LEFT VENTRICLE PLAX 2D LVIDd:         2.70 cm      Diastology LVIDs:         2.00 cm      LV e' medial:    8.16 cm/s LV PW:         1.50 cm      LV E/e' medial:  9.7 LV IVS:        1.50 cm      LV e' lateral:   17.80 cm/s LVOT diam:     2.50 cm      LV E/e' lateral: 4.5 LV SV:         98 LV SV Index:   43 LVOT Area:     4.91 cm  LV Volumes (MOD) LV vol d, MOD A2C: 136.0 ml LV vol d, MOD A4C: 155.0 ml LV vol s, MOD A2C: 58.6 ml LV  vol s, MOD A4C: 69.2 ml LV SV MOD A2C:     77.4 ml LV SV MOD A4C:     155.0 ml LV SV MOD BP:      78.6 ml RIGHT VENTRICLE             IVC RV Basal diam:  2.50 cm     IVC diam: 1.30 cm RV S prime:     15.60 cm/s TAPSE (M-mode): 2.1 cm LEFT ATRIUM             Index        RIGHT ATRIUM           Index LA diam:        4.40 cm 1.91 cm/m   RA Area:     14.40 cm LA Vol (A2C):   82.3 ml 35.74 ml/m  RA Volume:   28.50 ml  12.38 ml/m LA Vol (A4C):   96.9 ml 42.08 ml/m LA Biplane Vol: 93.4 ml 40.56 ml/m  AORTIC VALVE AV Area (Vmax):    5.03 cm AV Area (Vmean):   4.64 cm AV Area (VTI):     4.79 cm AV Vmax:           96.20 cm/s AV Vmean:  62.900 cm/s AV VTI:            0.205 m AV Peak Grad:      3.7 mmHg AV Mean Grad:      2.0 mmHg LVOT Vmax:         98.50 cm/s LVOT Vmean:        59.500 cm/s LVOT VTI:          0.200 m LVOT/AV VTI ratio: 0.98  AORTA Ao Root diam: 3.80 cm Ao Asc diam:  3.90 cm MITRAL VALVE MV Area (PHT): 2.90 cm    SHUNTS MV Decel Time: 262 msec    Systemic VTI:  0.20 m MV E velocity: 79.50 cm/s  Systemic Diam: 2.50 cm MV A velocity: 59.90 cm/s MV E/A ratio:  1.33 Donato Schultz MD Electronically signed by Donato Schultz MD Signature Date/Time: 02/21/2022/4:31:30 PM    Final    CT Angio Chest PE W and/or Wo Contrast  Result Date: 02/21/2022 CLINICAL DATA:  Shortness of breath, positive D-dimer EXAM: CT ANGIOGRAPHY CHEST WITH CONTRAST TECHNIQUE: Multidetector CT imaging of the chest was performed using the standard protocol during bolus administration of intravenous contrast. Multiplanar CT image reconstructions and MIPs were obtained to evaluate the vascular anatomy. RADIATION DOSE REDUCTION: This exam was performed according to the departmental dose-optimization program which includes automated exposure control, adjustment of the mA and/or kV according to patient size and/or use of iterative reconstruction technique. CONTRAST:  OMNIPAQUE IOHEXOL 350 MG/ML SOLN COMPARISON:  None Available.  FINDINGS: Cardiovascular: Filling defects in the right pulmonary arteries as proximally as the distal right main pulmonary artery (series 7, image 119 and series 8, image 77), extending into the right upper and lower lobar pulmonary arteries, with occlusive thrombus in the right lower lobe segmental arteries. The right middle lobe pulmonary arteries appear patent. No definite filling defects in the left upper and lower lobe pulmonary arteries. The main pulmonary artery is within normal limits for caliber. Reflux of contrast into the hepatic veins no evidence of right ventricular enlargement, with a normal RV: LV ratio. No pericardial effusion. Coronary artery calcifications. Aortic atherosclerotic calcifications. Mediastinum/Nodes: No enlarged mediastinal, hilar, or axillary lymph nodes. Thyroid gland, trachea, and esophagus demonstrate no significant findings. Lungs/Pleura: No focal pulmonary opacity, pneumothorax, or pleural effusion. Upper Abdomen: No acute abnormality. Musculoskeletal: No acute osseous abnormality. Review of the MIP images confirms the above findings. IMPRESSION: 1. Pulmonary emboli as proximally as the distal right main pulmonary artery, with thrombus seen in the right upper and lower lobar and segmental arteries, which is occlusive in the right lower lobe segmental arteries. 2. Reflux of contrast into the hepatic veins, raising concern for right heart strain, although there is no evidence of right ventricular enlargement and a normal RV: LV ratio. 3. Coronary artery calcifications. Aortic Atherosclerosis (ICD10-I70.0). These results were called by telephone at the time of interpretation on 02/21/2022 at 3:55 pm to provider [facility] , who verbally acknowledged these results. Electronically Signed   By: Wiliam Ke M.D.   On: 02/21/2022 15:57   DG Chest 2 View  Result Date: 02/21/2022 CLINICAL DATA:  Shortness of breath EXAM: CHEST - 2 VIEW COMPARISON:  09/30/2013 FINDINGS:  Cardiomediastinal silhouette is within normal limits. There is no focal airspace consolidation. There is no pleural effusion. No pneumothorax. There is no acute osseous abnormality. Bilateral shoulder degenerative changes with probable ossified joint bodies along the inferior aspect of the coracoid on the left, similar to prior. IMPRESSION: No evidence of  acute cardiopulmonary disease. Electronically Signed   By: Caprice RenshawJacob  Kahn M.D.   On: 02/21/2022 10:46    EKG: Independently reviewed.  Sinus, no acute ST changes.  Assessment/Plan Principal Problem:   Pulmonary emboli (HCC) Active Problems:   Acute pulmonary embolism (HCC)  (please populate well all problems here in Problem List. (For example, if patient is on BP meds at home and you resume or decide to hold them, it is a problem that needs to be her. Same for CAD, COPD, HLD and so on)  Pulmonary embolism, acute -Unprovoked -Echo pending to rule out right heart strain.  Clinically patient does not have hypoxia or hypotension or tachycardia. -Heparin drip and if Echo showed no significant right heart strain, plan to switch to Eliquis in 24 hours. -As per family's request, I communicated the PE finding to patient's Cardiology Dr. Excell Seltzerooper, who plans to see the patient tomorrow. -DVT study. -Initial anticoagulation x6 months, outpatient hematology follow-up to rule out hypercoagulable state.  CAD with stenting -Trop negative x2 ,  -Given that patient is on Heparin drip now, and has had stable angina, will continue Plavix but hold ASA for now. Will D/W his cardiology tomorrow regarding future antiplatelet regimen.  HTN -Stable, continue lisinopril and metoprolol   DVT prophylaxis: Heparin drip Code Status: Full code Family Communication: None at bedside Disposition Plan: Expect 1 to 2 days hospital stay Consults called: Cardiology Admission status: Tele admit   Emeline GeneralPing T Aymar Whitfill MD [facility] Pager [phone]  02/21/2022, 5:50 PM

## 2022-02-21 NOTE — ED Provider Triage Note (Signed)
Emergency Medicine Provider Triage Evaluation Note  Mario Underwood , a 61 y.o. male  was evaluated in triage.  Pt complains of shortness of breath since Friday.  Patient states that he began noticing that he was having increased work of breathing with exertion starting Friday.  He states that he has had similar symptoms in the past and when those have occurred has been found to be a MI.  Patient with history of multiple MIs and 6 stents.  Patient followed by [facility] and was advised to go to the hospital.  Patient denies chest pain, nausea, vomiting, leg swelling.  Endorses shortness of breath, worse with exertion Review of Systems  Positive: Shortness of breath Negative: Chest pain, nausea, vomiting  Physical Exam  BP 124/88 (BP Location: Right Arm)   Pulse 71   Temp 98.3 F (36.8 C) (Oral)   Resp 18   SpO2 94%  Gen:   Awake, no distress   Resp:  Normal effort  MSK:   Moves extremities without difficulty  Other:    Medical Decision Making  Medically screening exam initiated at 10:09 AM.  Appropriate orders placed.  LELAN CUSH was informed that the remainder of the evaluation will be completed by another provider, this initial triage assessment does not replace that evaluation, and the importance of remaining in the ED until their evaluation is complete.     Darrick Grinder, PA-C 02/21/22 1016

## 2022-02-21 NOTE — Telephone Encounter (Signed)
Thanks for letting me know!

## 2022-02-21 NOTE — ED Provider Notes (Signed)
[facility] EMERGENCY DEPARTMENT Provider Note   CSN: [id number] Arrival date & time: 02/21/22  7408     History  Chief Complaint  Patient presents with   Shortness of Breath    IFEOLUWA BELLER is a 61 y.o. male.   Shortness of Breath Associated symptoms: chest pain     61 year old male with medical history significant for CAD status post multiple stents, asthma, HTN, HLD who presents to the emergency department shortness of breath.  The patient states that he has had shortness of breath since this past Friday.  Symptoms worsened over the weekend.  He endorses some pleuritic chest discomfort on the left.  He denies a cough, fever or chills.  He feels occasional palpitations.  He states that his shortness of breath is worse on exertion.  He is worried that he may be having another MI.  He denies any fevers.  Endorses continued increased work of breathing with exertion.  He has had similar symptoms in the past when he had an MI.  He denies any lower extremity edema.  He denies any unilateral leg swelling.  He denies any recent long travel in the car on a plane.  He denies any history of DVT or PE.  He is not on anticoagulation but is on antiplatelet agents.  Home Medications Prior to Admission medications   Medication Sig Start Date End Date Taking? Authorizing Provider  albuterol (PROVENTIL HFA;VENTOLIN HFA) 108 (90 BASE) MCG/ACT inhaler Inhale 1 puff into the lungs 2 (two) times daily as needed for wheezing. 07/06/12  Yes Tonny Bollman, MD  aspirin EC 81 MG tablet Take 81 mg by mouth daily.   Yes [provider]  atorvastatin (LIPITOR) 80 MG tablet Take 1 tablet (80 mg total) by mouth daily. 10/12/21  Yes Tonny Bollman, MD  clopidogrel (PLAVIX) 75 MG tablet Take 1 tablet (75 mg total) by mouth daily. 10/12/21  Yes Tonny Bollman, MD  Glucosamine-Chondroitin-MSM 500-200-150 MG TABS Take 1 tablet by mouth daily.   Yes [provider]  lisinopril  (ZESTRIL) 10 MG tablet Take 1 tablet (10 mg total) by mouth daily. 10/12/21  Yes Tonny Bollman, MD  metoprolol tartrate (LOPRESSOR) 25 MG tablet Take 1 tablet (25 mg total) by mouth 2 (two) times daily. 10/12/21  Yes Tonny Bollman, MD  Multiple Vitamin (DAILY MULTIVITAMIN PO) Take 1 tablet by mouth daily.   Yes [provider]  nitroGLYCERIN (NITROSTAT) 0.4 MG SL tablet Place 1 tablet (0.4 mg total) under the tongue every 5 (five) minutes as needed for chest pain. 09/07/21  Yes Tonny Bollman, MD  Omega-3 Fatty Acids (FISH OIL PO) Take 1 capsule by mouth daily.   Yes [provider]      Allergies    Penicillins    Review of Systems   Review of Systems  Respiratory:  Positive for shortness of breath.   Cardiovascular:  Positive for chest pain.  All other systems reviewed and are negative.   Physical Exam Updated Vital Signs BP (!) 141/97   Pulse (!) 57   Temp 98 F (36.7 C) (Oral)   Resp 19   SpO2 97%  Physical Exam Vitals and nursing note reviewed.  Constitutional:      General: He is not in acute distress.    Appearance: He is well-developed.  HENT:     Head: Normocephalic and atraumatic.  Eyes:     Conjunctiva/sclera: Conjunctivae normal.  Cardiovascular:     Rate and Rhythm: Normal  rate and regular rhythm.     Heart sounds: No murmur heard. Pulmonary:     Effort: Pulmonary effort is normal. No respiratory distress.     Breath sounds: Normal breath sounds.  Abdominal:     Palpations: Abdomen is soft.     Tenderness: There is no abdominal tenderness.  Musculoskeletal:        General: No swelling.     Cervical back: Neck supple.  Skin:    General: Skin is warm and dry.     Capillary Refill: Capillary refill takes less than 2 seconds.  Neurological:     Mental Status: He is alert.  Psychiatric:        Mood and Affect: Mood normal.     ED Results / Procedures / Treatments   Labs (all labs ordered are listed, but only abnormal results are  displayed) Labs Reviewed  BASIC METABOLIC PANEL - Abnormal; Notable for the following components:      Result Value   Glucose, Bld 100 (*)    All other components within normal limits  CBC - Abnormal; Notable for the following components:   WBC 10.8 (*)    All other components within normal limits  D-DIMER, QUANTITATIVE - Abnormal; Notable for the following components:   D-Dimer, Quant 2.08 (*)    All other components within normal limits  BRAIN NATRIURETIC PEPTIDE  HEPARIN LEVEL (UNFRACTIONATED)  HIV ANTIBODY (ROUTINE TESTING W REFLEX)  HEPARIN LEVEL (UNFRACTIONATED)  CBC  TROPONIN I (HIGH SENSITIVITY)  TROPONIN I (HIGH SENSITIVITY)    EKG EKG Interpretation  Date/Time:  Monday February 21 2022 09:59:31 EDT Ventricular Rate:  72 PR Interval:  154 QRS Duration: 84 QT Interval:  376 QTC Calculation: 411 R Axis:   -19 Text Interpretation: Normal sinus rhythm Inferior infarct , age undetermined Abnormal ECG When compared with ECG of 15-Nov-2011 09:03, PREVIOUS ECG IS PRESENT Confirmed by Ernie Avena ([id number]) on 02/21/2022 12:27:35 PM  Radiology ECHOCARDIOGRAM COMPLETE  Result Date: 02/21/2022    ECHOCARDIOGRAM REPORT   Patient Name:   HADYN BLANCK Date of Exam: 02/21/2022 Medical Rec #:  [id number]       Height:       72.0 in Accession #:    [id number]      Weight:       240.2 lb Date of Birth:  1961/07/18        BSA:          2.303 m Patient Age:    61 years        BP:           134/87 mmHg Patient Gender: M               HR:           66 bpm. Exam Location:  Inpatient Procedure: 2D Echo, Cardiac Doppler and Color Doppler Indications:    Dyspnea  History:        Patient has no prior history of Echocardiogram examinations. CAD                 and Previous Myocardial Infarction, Signs/Symptoms:Shortness of                 Breath; Risk Factors:Hypertension and Dyslipidemia. DOE.  Sonographer:    Ross Ludwig RDCS (AE) Referring Phys: [id number] Ernie Avena IMPRESSIONS  1. Left ventricular  ejection fraction, by estimation, is 45 to 50%. The left ventricle has mildly decreased function. The left ventricle demonstrates regional wall  motion abnormalities (see scoring diagram/findings for description). There is moderate left ventricular hypertrophy. Left ventricular diastolic parameters are consistent with Grade II diastolic dysfunction (pseudonormalization).  2. Right ventricular systolic function is normal. The right ventricular size is normal.  3. Left atrial size was moderately dilated.  4. The mitral valve is normal in structure. Trivial mitral valve regurgitation. No evidence of mitral stenosis.  5. The aortic valve is normal in structure. Aortic valve regurgitation is not visualized. No aortic stenosis is present.  6. The inferior vena cava is normal in size with greater than 50% respiratory variability, suggesting right atrial pressure of 3 mmHg. FINDINGS  Left Ventricle: Left ventricular ejection fraction, by estimation, is 45 to 50%. The left ventricle has mildly decreased function. The left ventricle demonstrates regional wall motion abnormalities. The left ventricular internal cavity size was normal in size. There is moderate left ventricular hypertrophy. Left ventricular diastolic parameters are consistent with Grade II diastolic dysfunction (pseudonormalization).  LV Wall Scoring: The basal anteroseptal segment is dyskinetic. Right Ventricle: The right ventricular size is normal. No increase in right ventricular wall thickness. Right ventricular systolic function is normal. Left Atrium: Left atrial size was moderately dilated. Right Atrium: Right atrial size was normal in size. Pericardium: There is no evidence of pericardial effusion. Mitral Valve: The mitral valve is normal in structure. Trivial mitral valve regurgitation. No evidence of mitral valve stenosis. Tricuspid Valve: The tricuspid valve is normal in structure. Tricuspid valve regurgitation is not demonstrated. No evidence of  tricuspid stenosis. Aortic Valve: The aortic valve is normal in structure. Aortic valve regurgitation is not visualized. No aortic stenosis is present. Aortic valve mean gradient measures 2.0 mmHg. Aortic valve peak gradient measures 3.7 mmHg. Aortic valve area, by VTI measures 4.79 cm. Pulmonic Valve: The pulmonic valve was normal in structure. Pulmonic valve regurgitation is not visualized. No evidence of pulmonic stenosis. Aorta: The aortic root is normal in size and structure. Venous: The inferior vena cava is normal in size with greater than 50% respiratory variability, suggesting right atrial pressure of 3 mmHg. IAS/Shunts: No atrial level shunt detected by color flow Doppler.  LEFT VENTRICLE PLAX 2D LVIDd:         2.70 cm      Diastology LVIDs:         2.00 cm      LV e' medial:    8.16 cm/s LV PW:         1.50 cm      LV E/e' medial:  9.7 LV IVS:        1.50 cm      LV e' lateral:   17.80 cm/s LVOT diam:     2.50 cm      LV E/e' lateral: 4.5 LV SV:         98 LV SV Index:   43 LVOT Area:     4.91 cm  LV Volumes (MOD) LV vol d, MOD A2C: 136.0 ml LV vol d, MOD A4C: 155.0 ml LV vol s, MOD A2C: 58.6 ml LV vol s, MOD A4C: 69.2 ml LV SV MOD A2C:     77.4 ml LV SV MOD A4C:     155.0 ml LV SV MOD BP:      78.6 ml RIGHT VENTRICLE             IVC RV Basal diam:  2.50 cm     IVC diam: 1.30 cm RV S prime:     15.60 cm/s TAPSE (  M-mode): 2.1 cm LEFT ATRIUM             Index        RIGHT ATRIUM           Index LA diam:        4.40 cm 1.91 cm/m   RA Area:     14.40 cm LA Vol (A2C):   82.3 ml 35.74 ml/m  RA Volume:   28.50 ml  12.38 ml/m LA Vol (A4C):   96.9 ml 42.08 ml/m LA Biplane Vol: 93.4 ml 40.56 ml/m  AORTIC VALVE AV Area (Vmax):    5.03 cm AV Area (Vmean):   4.64 cm AV Area (VTI):     4.79 cm AV Vmax:           96.20 cm/s AV Vmean:          62.900 cm/s AV VTI:            0.205 m AV Peak Grad:      3.7 mmHg AV Mean Grad:      2.0 mmHg LVOT Vmax:         98.50 cm/s LVOT Vmean:        59.500 cm/s LVOT VTI:           0.200 m LVOT/AV VTI ratio: 0.98  AORTA Ao Root diam: 3.80 cm Ao Asc diam:  3.90 cm MITRAL VALVE MV Area (PHT): 2.90 cm    SHUNTS MV Decel Time: 262 msec    Systemic VTI:  0.20 m MV E velocity: 79.50 cm/s  Systemic Diam: 2.50 cm MV A velocity: 59.90 cm/s MV E/A ratio:  1.33 Donato Schultz MD Electronically signed by Donato Schultz MD Signature Date/Time: 02/21/2022/4:31:30 PM    Final    CT Angio Chest PE W and/or Wo Contrast  Result Date: 02/21/2022 CLINICAL DATA:  Shortness of breath, positive D-dimer EXAM: CT ANGIOGRAPHY CHEST WITH CONTRAST TECHNIQUE: Multidetector CT imaging of the chest was performed using the standard protocol during bolus administration of intravenous contrast. Multiplanar CT image reconstructions and MIPs were obtained to evaluate the vascular anatomy. RADIATION DOSE REDUCTION: This exam was performed according to the departmental dose-optimization program which includes automated exposure control, adjustment of the mA and/or kV according to patient size and/or use of iterative reconstruction technique. CONTRAST:  OMNIPAQUE IOHEXOL 350 MG/ML SOLN COMPARISON:  None Available. FINDINGS: Cardiovascular: Filling defects in the right pulmonary arteries as proximally as the distal right main pulmonary artery (series 7, image 119 and series 8, image 77), extending into the right upper and lower lobar pulmonary arteries, with occlusive thrombus in the right lower lobe segmental arteries. The right middle lobe pulmonary arteries appear patent. No definite filling defects in the left upper and lower lobe pulmonary arteries. The main pulmonary artery is within normal limits for caliber. Reflux of contrast into the hepatic veins no evidence of right ventricular enlargement, with a normal RV: LV ratio. No pericardial effusion. Coronary artery calcifications. Aortic atherosclerotic calcifications. Mediastinum/Nodes: No enlarged mediastinal, hilar, or axillary lymph nodes. Thyroid gland, trachea,  and esophagus demonstrate no significant findings. Lungs/Pleura: No focal pulmonary opacity, pneumothorax, or pleural effusion. Upper Abdomen: No acute abnormality. Musculoskeletal: No acute osseous abnormality. Review of the MIP images confirms the above findings. IMPRESSION: 1. Pulmonary emboli as proximally as the distal right main pulmonary artery, with thrombus seen in the right upper and lower lobar and segmental arteries, which is occlusive in the right lower lobe segmental arteries. 2. Reflux of contrast  into the hepatic veins, raising concern for right heart strain, although there is no evidence of right ventricular enlargement and a normal RV: LV ratio. 3. Coronary artery calcifications. Aortic Atherosclerosis (ICD10-I70.0). These results were called by telephone at the time of interpretation on 02/21/2022 at 3:55 pm to provider [facility] , who verbally acknowledged these results. Electronically Signed   By: Wiliam Ke M.D.   On: 02/21/2022 15:57   DG Chest 2 View  Result Date: 02/21/2022 CLINICAL DATA:  Shortness of breath EXAM: CHEST - 2 VIEW COMPARISON:  09/30/2013 FINDINGS: Cardiomediastinal silhouette is within normal limits. There is no focal airspace consolidation. There is no pleural effusion. No pneumothorax. There is no acute osseous abnormality. Bilateral shoulder degenerative changes with probable ossified joint bodies along the inferior aspect of the coracoid on the left, similar to prior. IMPRESSION: No evidence of acute cardiopulmonary disease. Electronically Signed   By: Caprice Renshaw M.D.   On: 02/21/2022 10:46    Procedures .Critical Care  Performed by: Ernie Avena, MD Authorized by: Ernie Avena, MD   Critical care provider statement:    Critical care time (minutes):  30   Critical care was time spent personally by me on the following activities:  Development of treatment plan with patient or surrogate, discussions with consultants, evaluation of patient's response  to treatment, examination of patient, ordering and review of laboratory studies, ordering and review of radiographic studies, ordering and performing treatments and interventions, pulse oximetry, re-evaluation of patient's condition and review of old charts   Care discussed with: admitting provider       Medications Ordered in ED Medications  heparin ADULT infusion 100 units/mL (25000 units/287mL) (1,600 Units/hr Intravenous New Bag/Given 02/21/22 1647)  atorvastatin (LIPITOR) tablet 80 mg (80 mg Oral Given 02/21/22 1739)  lisinopril (ZESTRIL) tablet 10 mg (has no administration in time range)  metoprolol tartrate (LOPRESSOR) tablet 25 mg (has no administration in time range)  clopidogrel (PLAVIX) tablet 75 mg (has no administration in time range)  albuterol (PROVENTIL) (2.5 MG/3ML) 0.083% nebulizer solution 2.5 mg (has no administration in time range)  ondansetron (ZOFRAN) tablet 4 mg (has no administration in time range)    Or  ondansetron (ZOFRAN) injection 4 mg (has no administration in time range)  oxyCODONE (Oxy IR/ROXICODONE) immediate release tablet 5 mg (has no administration in time range)  sodium chloride 0.9 % bolus 500 mL (0 mLs Intravenous Stopped 02/21/22 1617)  iohexol (OMNIPAQUE) 350 MG/ML injection 100 mL (100 mLs Intravenous Contrast Given 02/21/22 1541)  heparin bolus via infusion 6,000 Units (6,000 Units Intravenous Bolus from Bag 02/21/22 1647)    ED Course/ Medical Decision Making/ A&P                           Medical Decision Making Amount and/or Complexity of Data Reviewed Labs: ordered. Radiology: ordered.  Risk Prescription drug management. Decision regarding hospitalization.    61 year old male with medical history significant for CAD status post multiple stents, asthma, HTN, HLD who presents to the emergency department shortness of breath.  The patient states that he has had shortness of breath since this past Friday.  Symptoms worsened over the weekend.   He endorses some pleuritic chest discomfort on the left.  He denies a cough, fever or chills.  He feels occasional palpitations.  He states that his shortness of breath is worse on exertion.  He is worried that he may be having another MI.  He  denies any fevers.  Endorses continued increased work of breathing with exertion.  He has had similar symptoms in the past when he had an MI.  He denies any lower extremity edema.  He denies any unilateral leg swelling.  He denies any recent long travel in the car on a plane.  He denies any history of DVT or PE.  He is not on anticoagulation but is on antiplatelet agents.  On arrival, the patient was vitally stable, not tachycardic or tachypneic, not hypoxic, hemodynamically stable.  Sinus rhythm noted on cardiac telemetry.  EKG was performed and revealed normal sinus rhythm, prior inferior infarct present with Q waves present, no acute ST segment changes or T wave inversions.  Chest x-ray was performed and revealed no acute cardiac or pulmonary abnormality.  Differential diagnosis includes ACS, PE, pneumonia, pneumothorax, pleural effusion, pericarditis, pericardial effusion, COVID-19, pulmonary hypertension, anemia.  Screening laboratory work-up initiated to include a CBC with a mild leukocytosis to 10.8, no anemia, D-dimer elevated to 2.08, troponins x2 normal, BNP normal, BMP unremarkable.  Due to the patient's elevated D-dimer, a CT angiogram PE study was performed and results are as follows: IMPRESSION:  1. Pulmonary emboli as proximally as the distal right main pulmonary  artery, with thrombus seen in the right upper and lower lobar and  segmental arteries, which is occlusive in the right lower lobe  segmental arteries.  2. Reflux of contrast into the hepatic veins, raising concern for  right heart strain, although there is no evidence of right  ventricular enlargement and a normal RV: LV ratio.  3. Coronary artery calcifications. Aortic Atherosclerosis   (ICD10-I70.0).    These results were called by telephone at the time of interpretation  on 02/21/2022 at 3:55 pm to provider [facility] , who verbally  acknowledged these results.    The patient has no prior history of PE and is not on anticoagulation.  He was started on heparin.  Due to concern for possible right heart strain with reflux and extensive PE burden throughout the right lung pulmonary arterial system, hospitalist medicine was consulted for admission for further monitoring.  The patient was started on IV heparin and was informed of his diagnosis.  The patient was subsequently admitted in stable condition.  No sign of decompensation prior to admission, hemodynamically stable, saturating well on room air, in no distress.  Final Clinical Impression(s) / ED Diagnoses Final diagnoses:  Acute pulmonary embolism, unspecified pulmonary embolism type, unspecified whether acute cor pulmonale present [facility])    Rx / DC Orders ED Discharge Orders     None         Ernie Avena, MD 02/21/22 1839

## 2022-02-21 NOTE — Progress Notes (Signed)
ANTICOAGULATION CONSULT NOTE - Initial Consult  Pharmacy Consult for heparin Indication: pulmonary embolus  Allergies  Allergen Reactions   Penicillins Hives, Swelling, Rash and Other (See Comments)    SWELLING REACTION UNSPECIFIED  PATIENT HAS HAD A PCN REACTION WITH IMMEDIATE RASH, FACIAL/TONGUE/THROAT SWELLING, SOB, OR LIGHTHEADEDNESS WITH HYPOTENSION:  #  #  #  YES  #  #  #   Has patient had a PCN reaction causing severe rash involving mucus membranes or skin necrosis: No Had a PCN reaction that required hospitalization:  #  #  #  NO , but Needed to go to MD's OFFICE # # # Has patient had a PCN reaction occurring within the last 10 years: No      Patient Measurements:   Heparin Dosing Weight: 100kg  Vital Signs: Temp: 98.3 F (36.8 C) (07/31 1409) Temp Source: Oral (07/31 1409) BP: 134/87 (07/31 1515) Pulse Rate: 52 (07/31 1515)  Labs: Recent Labs    02/21/22 1019 02/21/22 1214  HGB 15.8  --   HCT 48.5  --   PLT 216  --   CREATININE 0.93  --   TROPONINIHS 6 6    CrCl cannot be calculated (Unknown ideal weight.).   Medical History: Past Medical History:  Diagnosis Date   Asthma    once.week   CAD (coronary artery disease)    A.  Inferior STEMI 12/12/10, treated with a Xience DES;  B.  Staged PCI of the LAD with a Promus DES;  C.  EF 55%, inferior AK   Hyperlipemia    Hypertension    Myocardial infarction [facility]) 2012   2 stents placed   Osteoarthritis    Right knee TKR pending with Dr. Charlann Boxer   Pneumonia     Assessment: Mario Underwood presenting with SOB, CT angio of chest with PE, concern for RHS, he is not on anticoagulation PTA, CBC WNL  Goal of Therapy:  Heparin level 0.3-0.7 units/ml Monitor platelets by anticoagulation protocol: Yes   Plan:  Heparin 6000 units IV x 1, and gtt at 1600 units/hr F/u 6 hour heparin level F/u long term [facility] plan  Daylene Posey, PharmD Clinical Pharmacist ED Pharmacist Phone # [phone] 02/21/2022 4:19 PM

## 2022-02-21 NOTE — Progress Notes (Signed)
Echo attempted. Patient going to CT. Will attempt again as schedule permits.

## 2022-02-21 NOTE — Progress Notes (Signed)
ANTICOAGULATION CONSULT NOTE - Follow Up Consult  Pharmacy Consult for heparin Indication: pulmonary embolus  Allergies  Allergen Reactions   Penicillins Hives, Swelling, Rash and Other (See Comments)    SWELLING REACTION UNSPECIFIED  PATIENT HAS HAD A PCN REACTION WITH IMMEDIATE RASH, FACIAL/TONGUE/THROAT SWELLING, SOB, OR LIGHTHEADEDNESS WITH HYPOTENSION:  #  #  #  YES  #  #  #   Has patient had a PCN reaction causing severe rash involving mucus membranes or skin necrosis: No Had a PCN reaction that required hospitalization:  #  #  #  NO , but Needed to go to MD's OFFICE # # # Has patient had a PCN reaction occurring within the last 10 years: No      Patient Measurements:   Heparin Dosing Weight: 100kg  Vital Signs: Temp: 98.8 F (37.1 C) (07/31 2100) Temp Source: Oral (07/31 2100) BP: 124/81 (07/31 2210) Pulse Rate: 114 (07/31 2210)  Labs: Recent Labs    02/21/22 1019 02/21/22 1214 02/21/22 2320  HGB 15.8  --   --   HCT 48.5  --   --   PLT 216  --   --   HEPARINUNFRC  --   --  0.37  CREATININE 0.93  --   --   TROPONINIHS 6 6  --      CrCl cannot be calculated (Unknown ideal weight.).   Medical History: Past Medical History:  Diagnosis Date   Asthma    once.week   CAD (coronary artery disease)    A.  Inferior STEMI 12/12/10, treated with a Xience DES;  B.  Staged PCI of the LAD with a Promus DES;  C.  EF 55%, inferior AK   Hyperlipemia    Hypertension    Myocardial infarction [facility]) 2012   2 stents placed   Osteoarthritis    Right knee TKR pending with Dr. Charlann Boxer   Pneumonia     Assessment: 78 YOM presenting with SOB, CT angio of chest with PE, concern for RHS, he is not on anticoagulation PTA, CBC WNL  Heparin level of 0.37 is therapeutic on heparin 1600 units/hr.   Goal of Therapy:  Heparin level 0.3-0.7 units/ml Monitor platelets by anticoagulation protocol: Yes   Plan:  Continue heparin 1600 units/hr  Follow up AM heparin level   Gerrit Halls, PharmD, BCPS Clinical Pharmacist 02/21/2022 11:55 PM

## 2022-02-22 ENCOUNTER — Other Ambulatory Visit (HOSPITAL_COMMUNITY): Payer: Self-pay

## 2022-02-22 ENCOUNTER — Inpatient Hospital Stay (HOSPITAL_COMMUNITY): Payer: Commercial Managed Care - PPO

## 2022-02-22 ENCOUNTER — Telehealth (HOSPITAL_COMMUNITY): Payer: Self-pay

## 2022-02-22 ENCOUNTER — Encounter: Payer: Self-pay | Admitting: Cardiovascular Disease

## 2022-02-22 DIAGNOSIS — I2699 Other pulmonary embolism without acute cor pulmonale: Secondary | ICD-10-CM | POA: Diagnosis not present

## 2022-02-22 DIAGNOSIS — I82409 Acute embolism and thrombosis of unspecified deep veins of unspecified lower extremity: Secondary | ICD-10-CM | POA: Diagnosis present

## 2022-02-22 DIAGNOSIS — I2510 Atherosclerotic heart disease of native coronary artery without angina pectoris: Secondary | ICD-10-CM | POA: Diagnosis not present

## 2022-02-22 DIAGNOSIS — I82401 Acute embolism and thrombosis of unspecified deep veins of right lower extremity: Secondary | ICD-10-CM | POA: Diagnosis not present

## 2022-02-22 LAB — CBC
HCT: 43.3 % (ref 39.0–52.0)
Hemoglobin: 14.6 g/dL (ref 13.0–17.0)
MCH: 31.4 pg (ref 26.0–34.0)
MCHC: 33.7 g/dL (ref 30.0–36.0)
MCV: 93.1 fL (ref 80.0–100.0)
Platelets: 197 10*3/uL (ref 150–400)
RBC: 4.65 MIL/uL (ref 4.22–5.81)
RDW: 13.7 % (ref 11.5–15.5)
WBC: 9.2 10*3/uL (ref 4.0–10.5)
nRBC: 0 % (ref 0.0–0.2)

## 2022-02-22 LAB — HIV ANTIBODY (ROUTINE TESTING W REFLEX): HIV Screen 4th Generation wRfx: NONREACTIVE

## 2022-02-22 LAB — HEPARIN LEVEL (UNFRACTIONATED): Heparin Unfractionated: 0.35 IU/mL (ref 0.30–0.70)

## 2022-02-22 NOTE — Progress Notes (Signed)
ANTICOAGULATION CONSULT NOTE  Pharmacy Consult for Heparin Indication: pulmonary embolus and peroneal vein DVT  Allergies  Allergen Reactions   Penicillins Hives, Swelling, Rash and Other (See Comments)    SWELLING REACTION UNSPECIFIED  PATIENT HAS HAD A PCN REACTION WITH IMMEDIATE RASH, FACIAL/TONGUE/THROAT SWELLING, SOB, OR LIGHTHEADEDNESS WITH HYPOTENSION:  #  #  #  YES  #  #  #   Has patient had a PCN reaction causing severe rash involving mucus membranes or skin necrosis: No Had a PCN reaction that required hospitalization:  #  #  #  NO , but Needed to go to MD's OFFICE # # # Has patient had a PCN reaction occurring within the last 10 years: No      Patient Measurements: Heparin Dosing Weight: 100 kg  Vital Signs: Temp: 97.5 F (36.4 C) (08/01 0814) Temp Source: Oral (08/01 0814) BP: 118/92 (08/01 0814) Pulse Rate: 75 (08/01 0814)  Labs: Recent Labs    02/21/22 1019 02/21/22 1214 02/21/22 2320 02/22/22 0554  HGB 15.8  --   --  14.6  HCT 48.5  --   --  43.3  PLT 216  --   --  197  HEPARINUNFRC  --   --  0.37  --   CREATININE 0.93  --   --   --   TROPONINIHS 6 6  --   --     CrCl cannot be calculated (Unknown ideal weight.).    Assessment: 71 YOM presenting with SOB, CT angio of chest with PE, concern for RHS, he is not on anticoagulation PTA, CBC WNL   First heparin level of 0.37 was therapeutic on heparin 1600 units/hr. Repeat heparin level 0.35. CBC stable, no issues with heparin infusion or signs of bleeding reported.   Goal of Therapy:  Heparin level 0.3-0.7 units/ml Monitor platelets by anticoagulation protocol: Yes   Plan:  Continue heparin infusion at 1600 units/hr Check heparin level daily while on heparin Continue to monitor H&H and platelets  F/u plan to transition to DOAC  Of note, copay for rivaroxaban and apixaban both $100 but has commercial insurance and should qualify for coupon card (will discuss with patient during  education)   Thank you for allowing pharmacy to be a part of this patient's care.  Thelma Barge, PharmD Clinical Pharmacist

## 2022-02-22 NOTE — Telephone Encounter (Signed)
Patient Scientific laboratory technician completed.     The patient is currently admitted and upon discharge could be taking Xarelto.   The current 30 day co-pay is, $100.    The patient is currently admitted and upon discharge could be taking Eliquis.   The current 30 day co-pay is, $100.   The patient is insured through [employer].

## 2022-02-22 NOTE — Plan of Care (Signed)

## 2022-02-22 NOTE — Plan of Care (Signed)

## 2022-02-22 NOTE — Progress Notes (Signed)
Bilateral LE venous duplex study completed. Please see CV Proc for preliminary results.  Kelaiah Escalona BS, RVT 02/22/2022 12:02 PM

## 2022-02-22 NOTE — TOC Initial Note (Signed)
Transition of Care [facility]) - Initial/Assessment Note    Patient Details  Name: TEDRICK PORT MRN: [id number] Date of Birth: 01-May-1961  Transition of Care [facility]) CM/SW Contact:    Epifanio Lesches, RN Phone Number: 02/22/2022, 2:47 PM  Clinical Narrative:                 Consult received: Please check insurance copays for Xarelto and Eliquis.Copay for Xarelto and eliquis are both $100, however, NCM can provide 30 day free card and $ 10.00 copay cards for each.  TOC team will contiue to monitor for needs....  Expected Discharge Plan: Home/Self Care Barriers to Discharge: Continued Medical Work up   Patient Goals and CMS Choice        Expected Discharge Plan and Services Expected Discharge Plan: Home/Self Care                                              Prior Living Arrangements/Services                       Activities of Daily Living      Permission Sought/Granted                  Emotional Assessment              Admission diagnosis:  Pulmonary emboli (HCC) [I26.99] Acute pulmonary embolism, unspecified pulmonary embolism type, unspecified whether acute cor pulmonale present (HCC) [I26.99] Patient Active Problem List   Diagnosis Date Noted   DVT (deep venous thrombosis) (HCC) 02/22/2022   Acute pulmonary embolism (HCC) 02/21/2022   Pulmonary emboli (HCC) 02/21/2022   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 [place] [address] Phone: [phone] Fax: [phone]     Social Determinants of Health (SDOH) Interventions    Readmission Risk Interventions     No data to display

## 2022-02-22 NOTE — Progress Notes (Signed)
[facility]                                                                              Mario Underwood, is a 61 y.o. male, DOB - 1961-03-07, WRU:[id number] Admit date - 02/21/2022    Outpatient Primary MD for the patient is Patient, No Pcp Per  LOS - 1  days  Chief Complaint  Patient presents with   Shortness of Breath       Brief summary   Patient is a 61 year old male with CAD, status post PCI, initially in 2012, late stent thrombosis of RCA in 2020 and had multiple overlapping DES, HTN, HLP, osteoarthritis presented with increasing shortness of breath.  Reported shortness of breath started 4 days ago prior to admission with exertional dyspnea, cough but no chest pain.  On the day of admission, woke up with severe feeling of difficulty breathing, palpitations but no chest pain.  No lower extremity edema. In ED, chest x-ray showed no acute infiltrates.  No hypoxia or tachycardia or hypotension noted. D-dimer was elevated, CTA showed pulmonary emboli distal right main pulmonary artery with thrombus in the right upper and lower lobar and segmental arteries, occlusive in the right lower lobe segmental arteries.  Concern for right heart strain, no evidence of right ventricular enlargement and normal RV LV ratio. Patient was placed on IV heparin drip and admitted for further work-up.  Assessment & Plan    Principal Problem:   Acute pulmonary embolism (HCC) with acute right popliteal and peroneal vein DVT -Unclear etiology, no recent long distance travel, no personal or family history of blood clots.  No hypoxia, hypotension or tachycardia. -Venous Doppler positive for right popliteal and peroneal vein DVT -2D echo with EF 40 to 45%, G2 DD -Continue IV heparin drip.  TOC consulted for insurance co-pays for DOAC's -Discussed about outpatient hematology follow-up to rule out hypercoagulable state  Active Problems:   CAD (coronary artery disease) with a history of PCI in  2012, late RCA stent closure in 2020 with DES -Currently no acute chest pain or anginal symptoms. -Continue statin, Plavix, metoprolol, lisinopril.  Currently on IV heparin drip for acute PE.  Aspirin held -Patient and wife requesting for Dr. Excell Seltzer to consult, cardiology consulted per request.  Will defer to cardiology regarding aspirin    Hypertension -BP stable, continue metoprolol, lisinopril    Hyperlipemia -Continue statin  Obesity Estimated body mass index is 32.58 kg/m as calculated from the following:   Height as of 10/12/21: 6' (1.829 m).   Weight as of 10/12/21: 109 kg.  Code Status: Full CODE STATUS DVT Prophylaxis:    On IV heparin drip   Level of Care: Level of care: Telemetry Medical Family Communication: Updated patient's wife at the bedside in detail including imaging results and plan of treatment.  Wife requested Dr. Excell Seltzer to see the patient, cardiology consult has been placed.   Disposition Plan:      Remains inpatient appropriate: On IV heparin drip, pending insurance co-pays for DOAC's   Procedures:  2D echo  Consultants:   Cardiology  Antimicrobials:   Anti-infectives (From admission, onward)  None          Medications  atorvastatin  80 mg Oral Daily   clopidogrel  75 mg Oral Daily   lisinopril  10 mg Oral Daily   metoprolol tartrate  25 mg Oral BID      Subjective:   Mario Underwood was seen and examined today.  Currently no acute complaints, no chest pain.  Wife at the bedside.  No lower extremity swelling.  Patient denies dizziness, abdominal pain, N/V/D/C.   Objective:   Vitals:   02/21/22 2210 02/22/22 0100 02/22/22 0508 02/22/22 0814  BP: 124/81 115/82 125/81 (!) 118/92  Pulse: (!) 114 74 62 75  Resp:   16 17  Temp:  98.5 F (36.9 C)  (!) 97.5 F (36.4 C)  TempSrc:  Oral  Oral  SpO2:  95% 95% 100%    Intake/Output Summary (Last 24 hours) at 02/22/2022 1214 Last data filed at 02/22/2022 0900 Gross per 24 hour  Intake  740 ml  Output --  Net 740 ml     Wt Readings from Last 3 Encounters:  10/12/21 109 kg  08/31/20 107.2 kg  08/30/19 106 kg     Exam General: Alert and oriented x 3, NAD Cardiovascular: S1 S2 auscultated,  RRR Respiratory: Clear to auscultation bilaterally Gastrointestinal: Soft, nontender, nondistended, + bowel sounds Ext: no pedal edema bilaterally Neuro: no new FND's Psych: Normal affect and demeanor, alert and oriented x3     Data Reviewed:  I have personally reviewed following labs    CBC Lab Results  Component Value Date   WBC 9.2 02/22/2022   RBC 4.65 02/22/2022   HGB 14.6 02/22/2022   HCT 43.3 02/22/2022   MCV 93.1 02/22/2022   MCH 31.4 02/22/2022   PLT 197 02/22/2022   MCHC 33.7 02/22/2022   RDW 13.7 02/22/2022   LYMPHSABS 2.6 08/31/2020   MONOABS 1.0 09/14/2017   EOSABS 0.1 08/31/2020   BASOSABS 0.1 [id number]     Last metabolic panel Lab Results  Component Value Date   NA 139 02/21/2022   K 4.7 02/21/2022   CL 105 02/21/2022   CO2 26 02/21/2022   BUN 9 02/21/2022   CREATININE 0.93 02/21/2022   GLUCOSE 100 (H) 02/21/2022   GFRNONAA >60 02/21/2022   GFRAA 101 08/31/2020   CALCIUM 9.6 02/21/2022   PROT 7.1 10/12/2021   ALBUMIN 4.6 10/12/2021   LABGLOB 2.5 10/12/2021   AGRATIO 1.8 10/12/2021   BILITOT 0.7 10/12/2021   ALKPHOS 137 (H) 10/12/2021   AST 16 10/12/2021   ALT 19 10/12/2021   ANIONGAP 8 02/21/2022      Radiology Studies: I have personally reviewed the imaging studies  VAS [place] LOWER EXTREMITY VENOUS (DVT)  Result Date: 02/22/2022  Lower Venous DVT Study Summary: BILATERAL: -No evidence of popliteal cyst, bilaterally. RIGHT: - Findings consistent with acute deep vein thrombosis involving the right popliteal vein, and right peroneal veins. - All other veins visualized appear fully compressible and demonstrate appropriate Doppler characteristics.  LEFT: - No evidence of deep vein thrombosis in the lower extremity. No indirect  evidence of obstruction proximal to the inguinal ligament.  *See table(s) above for measurements and observations.    Preliminary    ECHOCARDIOGRAM COMPLETE  Result Date: 02/21/2022    ECHOCARDIOGRAM REPORT   IMPRESSIONS  1. Left ventricular ejection fraction, by estimation, is 45 to 50%. The left ventricle has mildly decreased function. The left ventricle demonstrates regional wall motion abnormalities (see scoring diagram/findings for description). There  is moderate left ventricular hypertrophy. Left ventricular diastolic parameters are consistent with Grade II diastolic dysfunction (pseudonormalization).  2. Right ventricular systolic function is normal. The right ventricular size is normal.  3. Left atrial size was moderately dilated.  4. The mitral valve is normal in structure. Trivial mitral valve regurgitation. No evidence of mitral stenosis.  5. The aortic valve is normal in structure. Aortic valve regurgitation is not visualized. No aortic stenosis is present.  6. The inferior vena cava is normal in size with greater than 50% respiratory variability, suggesting right atrial pressure of 3 mmHg.  Donato Schultz MD Electronically signed by Donato Schultz MD Signature Date/Time: 02/21/2022/4:31:30 PM    Final    CT Angio Chest PE W and/or Wo Contrast  Result Date: 02/21/2022  IMPRESSION: 1. Pulmonary emboli as proximally as the distal right main pulmonary artery, with thrombus seen in the right upper and lower lobar and segmental arteries, which is occlusive in the right lower lobe segmental arteries. 2. Reflux of contrast into the hepatic veins, raising concern for right heart strain, although there is no evidence of right ventricular enlargement and a normal RV: LV ratio. 3. Coronary artery calcifications. Aortic Atherosclerosis (ICD10-I70.0). These results were called by telephone at the time of interpretation on 02/21/2022 at 3:55 pm to provider [facility] , who verbally acknowledged these results.  Electronically Signed   By: Wiliam Ke M.D.   On: 02/21/2022 15:57   DG Chest 2 View  Result Date: 02/21/2022 IMPRESSION: No evidence of acute cardiopulmonary disease. Electronically Signed   By: Caprice Renshaw M.D.   On: 02/21/2022 10:46       Shuronda Santino M.D. [facility] 02/22/2022, 12:14 PM  Available via Epic secure chat 7am-7pm After 7 pm, please refer to night coverage provider listed on amion.

## 2022-02-23 ENCOUNTER — Telehealth: Payer: Self-pay | Admitting: General Practice

## 2022-02-23 ENCOUNTER — Other Ambulatory Visit (HOSPITAL_COMMUNITY): Payer: Self-pay

## 2022-02-23 DIAGNOSIS — I10 Essential (primary) hypertension: Secondary | ICD-10-CM

## 2022-02-23 DIAGNOSIS — E785 Hyperlipidemia, unspecified: Secondary | ICD-10-CM

## 2022-02-23 LAB — CBC
HCT: 45.8 % (ref 39.0–52.0)
Hemoglobin: 15.4 g/dL (ref 13.0–17.0)
MCH: 31.8 pg (ref 26.0–34.0)
MCHC: 33.6 g/dL (ref 30.0–36.0)
MCV: 94.4 fL (ref 80.0–100.0)
Platelets: 205 10*3/uL (ref 150–400)
RBC: 4.85 MIL/uL (ref 4.22–5.81)
RDW: 13.6 % (ref 11.5–15.5)
WBC: 10.1 10*3/uL (ref 4.0–10.5)
nRBC: 0 % (ref 0.0–0.2)

## 2022-02-23 LAB — HEPARIN LEVEL (UNFRACTIONATED): Heparin Unfractionated: 0.51 IU/mL (ref 0.30–0.70)

## 2022-02-23 MED ORDER — APIXABAN (ELIQUIS) VTE STARTER PACK (10MG AND 5MG)
ORAL_TABLET | ORAL | 0 refills | Status: DC
  Filled 2022-02-23: qty 74, 30d supply, fill #0

## 2022-02-23 MED ORDER — APIXABAN 5 MG PO TABS: 5.0000 mg | ORAL_TABLET | Freq: Two times a day (BID) | ORAL | Status: DC

## 2022-02-23 MED ORDER — APIXABAN 5 MG PO TABS: 5.0000 mg | ORAL_TABLET | Freq: Two times a day (BID) | ORAL | 1 refills | Status: DC

## 2022-02-23 MED ORDER — APIXABAN 5 MG PO TABS
10.0000 mg | ORAL_TABLET | Freq: Two times a day (BID) | ORAL | Status: DC
  Administered 2022-02-23: 10 mg via ORAL
  Filled 2022-02-23: qty 2

## 2022-02-23 NOTE — Plan of Care (Signed)

## 2022-02-23 NOTE — Discharge Instructions (Addendum)
Mario Underwood,  You are in the hospital and found to have pulmonary embolism (blood clot in your lung) in addition to an acute DVT in the right leg (blood clot in your leg).  Given treated with blood thinners to help dissolve the blood clot and prevent further recurrence.  He will go home on Eliquis and will need to take this twice daily as prescribed.  Please establish with a primary care physician so therapy can have continued management as an outpatient.  Your aspirin has been discontinued which was confirmed with the cardiologist since we are starting a blood thinner.   Information on my medicine - ELIQUIS (apixaban)  This medication education was reviewed with me or my healthcare representative as part of my discharge preparation.   Why was Eliquis prescribed for you? Eliquis was prescribed to treat blood clots that may have been found in the veins of your legs (deep vein thrombosis) or in your lungs (pulmonary embolism) and to reduce the risk of them occurring again.  What do You need to know about Eliquis ? The starting dose is 10 mg (two 5 mg tablets) taken TWICE daily for the FIRST SEVEN (7) DAYS, then on  03/02/2022  the dose is reduced to ONE 5 mg tablet taken TWICE daily.  Eliquis may be taken with or without food.   Try to take the dose about the same time in the morning and in the evening. If you have difficulty swallowing the tablet whole please discuss with your pharmacist how to take the medication safely.  Take Eliquis exactly as prescribed and DO NOT stop taking Eliquis without talking to the doctor who prescribed the medication.  Stopping may increase your risk of developing a new blood clot.  Refill your prescription before you run out.  After discharge, you should have regular check-up appointments with your healthcare provider that is prescribing your Eliquis.    What do you do if you miss a dose? If a dose of ELIQUIS is not taken at the scheduled time, take it  as soon as possible on the same day and twice-daily administration should be resumed. The dose should not be doubled to make up for a missed dose.  Important Safety Information A possible side effect of Eliquis is bleeding. You should call your healthcare provider right away if you experience any of the following: Bleeding from an injury or your nose that does not stop. Unusual colored urine (red or dark brown) or unusual colored stools (red or black). Unusual bruising for unknown reasons. A serious fall or if you hit your head (even if there is no bleeding).  Some medicines may interact with Eliquis and might increase your risk of bleeding or clotting while on Eliquis. To help avoid this, consult your healthcare provider or pharmacist prior to using any new prescription or non-prescription medications, including herbals, vitamins, non-steroidal anti-inflammatory drugs (NSAIDs) and supplements.  This website has more information on Eliquis (apixaban): http://www.eliquis.com/eliquis/home

## 2022-02-23 NOTE — Telephone Encounter (Signed)
WIFE CALLED ABOUT THE PT GETTING A NEW PT APPT WITHIN THE NEXT 2 WEEK DUE TO HIM BEING DISCHARGE FROM THE HOSPITAL TODAY AFTER FINDING BLOOD CLOTS IN LUNGS AND LEG. SHE ASKED IF YOU COULD WORK HIM IN AS A NEW PATIENT/HOSPITAL FOLLOW. PLEASE LET ME KNOW I WILL CONTACT THE PT WIFE.

## 2022-02-23 NOTE — Discharge Summary (Signed)
Physician Discharge Summary   Patient: Mario Underwood MRN: [id number] DOB: May 09, 1961  Admit date:     02/21/2022  Discharge date: {dischdate:26783}  Discharge Physician: Cordelia Poche   PCP: Patient, No Pcp Per   Recommendations at discharge:  {Tip this will not be part of the note when signed- Example include specific recommendations for outpatient follow-up, pending tests to follow-up on. (Optional):26781}  ***  Discharge Diagnoses: Principal Problem:   Acute pulmonary embolism (HCC) Active Problems:   CAD (coronary artery disease)   Hypertension   Hyperlipemia   DVT (deep venous thrombosis) (HCC)  Resolved Problems:   * No resolved hospital problems. [facility] Course: No notes on file  Assessment and Plan: No notes have been filed under this hospital service. Service: Hospitalist     {Tip this will not be part of the note when signed There is no height or weight on file to calculate BMI. , ,  (Optional):26781}  {(NOTE) Pain control PDMP Statment (Optional):26782} Consultants: *** Procedures performed: ***  Disposition: {Plan; Disposition:26390} Diet recommendation:  {Diet_Plan:26776} DISCHARGE MEDICATION: Allergies as of 02/23/2022       Reactions   Penicillins Hives, Swelling, Rash, Other (See Comments)   SWELLING REACTION UNSPECIFIED  PATIENT HAS HAD A PCN REACTION WITH IMMEDIATE RASH, FACIAL/TONGUE/THROAT SWELLING, SOB, OR LIGHTHEADEDNESS WITH HYPOTENSION:  #  #  #  YES  #  #  #   Has patient had a PCN reaction causing severe rash involving mucus membranes or skin necrosis: No Had a PCN reaction that required hospitalization:  #  #  #  NO , but Needed to go to [facility] OFFICE # # # Has patient had a PCN reaction occurring within the last 10 years: No        Medication List     STOP taking these medications    aspirin EC 81 MG tablet       TAKE these medications    albuterol 108 (90 Base) MCG/ACT inhaler Commonly known as: VENTOLIN HFA Inhale 1  puff into the lungs 2 (two) times daily as needed for wheezing.   Apixaban Starter Pack (10mg  and 5mg ) Commonly known as: ELIQUIS STARTER PACK Take as directed on package: start with two-5mg  tablets twice daily for 7 days. On day 8, switch to one-5mg  tablet twice daily.   apixaban 5 MG Tabs tablet Commonly known as: ELIQUIS Take 1 tablet (5 mg total) by mouth 2 (two) times daily. Start taking on: March 25, 2022   atorvastatin 80 MG tablet Commonly known as: LIPITOR Take 1 tablet (80 mg total) by mouth daily.   clopidogrel 75 MG tablet Commonly known as: PLAVIX Take 1 tablet (75 mg total) by mouth daily.   DAILY MULTIVITAMIN PO Take 1 tablet by mouth daily.   FISH OIL PO Take 1 capsule by mouth daily.   Glucosamine-Chondroitin-MSM 500-200-150 MG Tabs Take 1 tablet by mouth daily.   lisinopril 10 MG tablet Commonly known as: ZESTRIL Take 1 tablet (10 mg total) by mouth daily.   metoprolol tartrate 25 MG tablet Commonly known as: LOPRESSOR Take 1 tablet (25 mg total) by mouth 2 (two) times daily.   nitroGLYCERIN 0.4 MG SL tablet Commonly known as: NITROSTAT Place 1 tablet (0.4 mg total) under the tongue every 5 (five) minutes as needed for chest pain.        Follow-up Information     [facility] Follow up.   Why: Establish care. Hopeful visit in 1-2 weeks if  possible. Contact information: [address] [place] [id number]               Discharge Exam: There were no vitals filed for this visit. ***  Condition at discharge: {DC Condition:26389}  The results of significant diagnostics from this hospitalization (including imaging, microbiology, ancillary and laboratory) are listed below for reference.   Imaging Studies: VAS [place] LOWER EXTREMITY VENOUS (DVT)  Result Date: 02/22/2022  Lower Venous DVT Study Patient Name:  Mario Underwood  Date of Exam:   02/22/2022 Medical Rec #: [id number]        Accession #:     [id number] Date of Birth: May 28, 1961         Patient Gender: M Patient Age:   61 years Exam Location:  [facility] Procedure:      VAS [place] LOWER EXTREMITY VENOUS (DVT) Referring Phys: Wynetta Fines --------------------------------------------------------------------------------  Indications: Pulmonary embolism.  Comparison Study: No previous exam noted. Performing Technologist: Bobetta Lime BS, RVT  Examination Guidelines: A complete evaluation includes B-mode imaging, spectral Doppler, color Doppler, and power Doppler as needed of all accessible portions of each vessel. Bilateral testing is considered an integral part of a complete examination. Limited examinations for reoccurring indications may be performed as noted. The reflux portion of the exam is performed with the patient in reverse Trendelenburg.  +---------+---------------+---------+-----------+----------+--------------+ RIGHT    CompressibilityPhasicitySpontaneityPropertiesThrombus Aging +---------+---------------+---------+-----------+----------+--------------+ CFV      Full           Yes      Yes                                 +---------+---------------+---------+-----------+----------+--------------+ SFJ      Full                                                        +---------+---------------+---------+-----------+----------+--------------+ FV Prox  Full                                                        +---------+---------------+---------+-----------+----------+--------------+ FV Mid   Full                                                        +---------+---------------+---------+-----------+----------+--------------+ FV DistalFull                                                        +---------+---------------+---------+-----------+----------+--------------+ PFV      Full                                                         +---------+---------------+---------+-----------+----------+--------------+  POP      None           No       No                   Acute          +---------+---------------+---------+-----------+----------+--------------+ PTV      Full                                                        +---------+---------------+---------+-----------+----------+--------------+ PERO     None                                         Acute          +---------+---------------+---------+-----------+----------+--------------+ Gastroc  Full                                                        +---------+---------------+---------+-----------+----------+--------------+   +---------+---------------+---------+-----------+----------+--------------+ LEFT     CompressibilityPhasicitySpontaneityPropertiesThrombus Aging +---------+---------------+---------+-----------+----------+--------------+ CFV      Full           Yes      Yes                                 +---------+---------------+---------+-----------+----------+--------------+ SFJ      Full                                                        +---------+---------------+---------+-----------+----------+--------------+ FV Prox  Full                                                        +---------+---------------+---------+-----------+----------+--------------+ FV Mid   Full                                                        +---------+---------------+---------+-----------+----------+--------------+ FV DistalFull                                                        +---------+---------------+---------+-----------+----------+--------------+ PFV      Full                                                        +---------+---------------+---------+-----------+----------+--------------+  POP      Full           Yes      Yes                                  +---------+---------------+---------+-----------+----------+--------------+ PTV      Full                                                        +---------+---------------+---------+-----------+----------+--------------+ PERO     Full                                                        +---------+---------------+---------+-----------+----------+--------------+     Summary: BILATERAL: -No evidence of popliteal cyst, bilaterally. RIGHT: - Findings consistent with acute deep vein thrombosis involving the right popliteal vein, and right peroneal veins. - All other veins visualized appear fully compressible and demonstrate appropriate Doppler characteristics.  LEFT: - No evidence of deep vein thrombosis in the lower extremity. No indirect evidence of obstruction proximal to the inguinal ligament.  *See table(s) above for measurements and observations. Electronically signed by Deitra Mayo MD on 02/22/2022 at 1:31:21 PM.    Final    ECHOCARDIOGRAM COMPLETE  Result Date: 02/21/2022    ECHOCARDIOGRAM REPORT   Patient Name:   Mario Underwood Date of Exam: 02/21/2022 Medical Rec #:  [id number]       Height:       72.0 in Accession #:    [id number]      Weight:       240.2 lb Date of Birth:  [birth year]        BSA:          2.303 m Patient Age:    16 years        BP:           134/87 mmHg Patient Gender: M               HR:           66 bpm. Exam Location:  Inpatient Procedure: 2D Echo, Cardiac Doppler and Color Doppler Indications:    Dyspnea  History:        Patient has no prior history of Echocardiogram examinations. CAD                 and Previous Myocardial Infarction, Signs/Symptoms:Shortness of                 Breath; Risk Factors:Hypertension and Dyslipidemia. DOE.  Sonographer:    Clayton Lefort RDCS (AE) Referring Phys: [id number] [place]  1. Left ventricular ejection fraction, by estimation, is 45 to 50%. The left ventricle has mildly decreased function. The left ventricle  demonstrates regional wall motion abnormalities (see scoring diagram/findings for description). There is moderate left ventricular hypertrophy. Left ventricular diastolic parameters are consistent with Grade II diastolic dysfunction (pseudonormalization).  2. Right ventricular systolic function is normal. The right ventricular size is normal.  3. Left atrial size was moderately dilated.  4. The mitral valve is normal in structure. Trivial mitral valve regurgitation.  No evidence of mitral stenosis.  5. The aortic valve is normal in structure. Aortic valve regurgitation is not visualized. No aortic stenosis is present.  6. The inferior vena cava is normal in size with greater than 50% respiratory variability, suggesting right atrial pressure of 3 mmHg. FINDINGS  Left Ventricle: Left ventricular ejection fraction, by estimation, is 45 to 50%. The left ventricle has mildly decreased function. The left ventricle demonstrates regional wall motion abnormalities. The left ventricular internal cavity size was normal in size. There is moderate left ventricular hypertrophy. Left ventricular diastolic parameters are consistent with Grade II diastolic dysfunction (pseudonormalization).  LV Wall Scoring: The basal anteroseptal segment is dyskinetic. Right Ventricle: The right ventricular size is normal. No increase in right ventricular wall thickness. Right ventricular systolic function is normal. Left Atrium: Left atrial size was moderately dilated. Right Atrium: Right atrial size was normal in size. Pericardium: There is no evidence of pericardial effusion. Mitral Valve: The mitral valve is normal in structure. Trivial mitral valve regurgitation. No evidence of mitral valve stenosis. Tricuspid Valve: The tricuspid valve is normal in structure. Tricuspid valve regurgitation is not demonstrated. No evidence of tricuspid stenosis. Aortic Valve: The aortic valve is normal in structure. Aortic valve regurgitation is not visualized.  No aortic stenosis is present. Aortic valve mean gradient measures 2.0 mmHg. Aortic valve peak gradient measures 3.7 mmHg. Aortic valve area, by VTI measures 4.79 cm. Pulmonic Valve: The pulmonic valve was normal in structure. Pulmonic valve regurgitation is not visualized. No evidence of pulmonic stenosis. Aorta: The aortic root is normal in size and structure. Venous: The inferior vena cava is normal in size with greater than 50% respiratory variability, suggesting right atrial pressure of 3 mmHg. IAS/Shunts: No atrial level shunt detected by color flow Doppler.  LEFT VENTRICLE PLAX 2D LVIDd:         2.70 cm      Diastology LVIDs:         2.00 cm      LV e' medial:    8.16 cm/s LV PW:         1.50 cm      LV E/e' medial:  9.7 LV IVS:        1.50 cm      LV e' lateral:   17.80 cm/s LVOT diam:     2.50 cm      LV E/e' lateral: 4.5 LV SV:         98 LV SV Index:   43 LVOT Area:     4.91 cm  LV Volumes (MOD) LV vol d, MOD A2C: 136.0 ml LV vol d, MOD A4C: 155.0 ml LV vol s, MOD A2C: 58.6 ml LV vol s, MOD A4C: 69.2 ml LV SV MOD A2C:     77.4 ml LV SV MOD A4C:     155.0 ml LV SV MOD BP:      78.6 ml RIGHT VENTRICLE             IVC RV Basal diam:  2.50 cm     IVC diam: 1.30 cm RV S prime:     15.60 cm/s TAPSE (M-mode): 2.1 cm LEFT ATRIUM             Index        RIGHT ATRIUM           Index LA diam:        4.40 cm 1.91 cm/m   RA Area:     14.40  cm LA Vol (A2C):   82.3 ml 35.74 ml/m  RA Volume:   28.50 ml  12.38 ml/m LA Vol (A4C):   96.9 ml 42.08 ml/m LA Biplane Vol: 93.4 ml 40.56 ml/m  AORTIC VALVE AV Area (Vmax):    5.03 cm AV Area (Vmean):   4.64 cm AV Area (VTI):     4.79 cm AV Vmax:           96.20 cm/s AV Vmean:          62.900 cm/s AV VTI:            0.205 m AV Peak Grad:      3.7 mmHg AV Mean Grad:      2.0 mmHg LVOT Vmax:         98.50 cm/s LVOT Vmean:        59.500 cm/s LVOT VTI:          0.200 m LVOT/AV VTI ratio: 0.98  AORTA Ao Root diam: 3.80 cm Ao Asc diam:  3.90 cm MITRAL VALVE MV Area (PHT): 2.90  cm    SHUNTS MV Decel Time: 262 msec    Systemic VTI:  0.20 m MV E velocity: 79.50 cm/s  Systemic Diam: 2.50 cm MV A velocity: 59.90 cm/s MV E/A ratio:  1.33 Donato Schultz MD Electronically signed by Donato Schultz MD Signature Date/Time: 02/21/2022/4:31:30 PM    Final    CT Angio Chest PE W and/or Wo Contrast  Result Date: 02/21/2022 CLINICAL DATA:  Shortness of breath, positive D-dimer EXAM: CT ANGIOGRAPHY CHEST WITH CONTRAST TECHNIQUE: Multidetector CT imaging of the chest was performed using the standard protocol during bolus administration of intravenous contrast. Multiplanar CT image reconstructions and MIPs were obtained to evaluate the vascular anatomy. RADIATION DOSE REDUCTION: This exam was performed according to the departmental dose-optimization program which includes automated exposure control, adjustment of the mA and/or kV according to patient size and/or use of iterative reconstruction technique. CONTRAST:  OMNIPAQUE IOHEXOL 350 MG/ML SOLN COMPARISON:  None Available. FINDINGS: Cardiovascular: Filling defects in the right pulmonary arteries as proximally as the distal right main pulmonary artery (series 7, image 119 and series 8, image 77), extending into the right upper and lower lobar pulmonary arteries, with occlusive thrombus in the right lower lobe segmental arteries. The right middle lobe pulmonary arteries appear patent. No definite filling defects in the left upper and lower lobe pulmonary arteries. The main pulmonary artery is within normal limits for caliber. Reflux of contrast into the hepatic veins no evidence of right ventricular enlargement, with a normal RV: LV ratio. No pericardial effusion. Coronary artery calcifications. Aortic atherosclerotic calcifications. Mediastinum/Nodes: No enlarged mediastinal, hilar, or axillary lymph nodes. Thyroid gland, trachea, and esophagus demonstrate no significant findings. Lungs/Pleura: No focal pulmonary opacity, pneumothorax, or pleural  effusion. Upper Abdomen: No acute abnormality. Musculoskeletal: No acute osseous abnormality. Review of the MIP images confirms the above findings. IMPRESSION: 1. Pulmonary emboli as proximally as the distal right main pulmonary artery, with thrombus seen in the right upper and lower lobar and segmental arteries, which is occlusive in the right lower lobe segmental arteries. 2. Reflux of contrast into the hepatic veins, raising concern for right heart strain, although there is no evidence of right ventricular enlargement and a normal RV: LV ratio. 3. Coronary artery calcifications. Aortic Atherosclerosis (ICD10-I70.0). These results were called by telephone at the time of interpretation on 02/21/2022 at 3:55 pm to provider [facility] , who verbally acknowledged these results. Electronically Signed  By: Merilyn Baba M.D.   On: 02/21/2022 15:57   DG Chest 2 View  Result Date: 02/21/2022 CLINICAL DATA:  Shortness of breath EXAM: CHEST - 2 VIEW COMPARISON:  09/30/2013 FINDINGS: Cardiomediastinal silhouette is within normal limits. There is no focal airspace consolidation. There is no pleural effusion. No pneumothorax. There is no acute osseous abnormality. Bilateral shoulder degenerative changes with probable ossified joint bodies along the inferior aspect of the coracoid on the left, similar to prior. IMPRESSION: No evidence of acute cardiopulmonary disease. Electronically Signed   By: Maurine Simmering M.D.   On: 02/21/2022 10:46    Microbiology: Results for orders placed or performed during the hospital encounter of 09/14/17  Surgical pcr screen     Status: None   Collection Time: 09/14/17  9:25 AM   Specimen: Nasal Mucosa; Nasal Swab  Result Value Ref Range Status   MRSA, PCR NEGATIVE NEGATIVE Final   Staphylococcus aureus NEGATIVE NEGATIVE Final    Comment: (NOTE) The Xpert SA Assay (FDA approved for NASAL specimens in patients 30 years of age and older), is one component of a  comprehensive surveillance program. It is not intended to diagnose infection nor to guide or monitor treatment. Performed at [facility], [place] [address], [place], [place] [address]     Labs: CBC: Recent Labs  Lab 02/21/22 1019 02/22/22 0554 02/23/22 0506  WBC 10.8* 9.2 10.1  HGB 15.8 14.6 15.4  HCT 48.5 43.3 45.8  MCV 96.2 93.1 94.4  PLT 216 197 [id number]   Basic Metabolic Panel: Recent Labs  Lab 02/21/22 1019  NA 139  K 4.7  CL 105  CO2 26  GLUCOSE 100*  BUN 9  CREATININE 0.93  CALCIUM 9.6   Liver Function Tests: No results for input(s): "AST", "ALT", "ALKPHOS", "BILITOT", "PROT", "ALBUMIN" in the last 168 hours. CBG: No results for input(s): "GLUCAP" in the last 168 hours.  Discharge time spent: {LESS THAN/GREATER [id number] 30 minutes.  Signed: Cordelia Poche, MD [facility] 02/23/2022

## 2022-02-23 NOTE — Telephone Encounter (Signed)
I honestly don't have any openings that soon for a new patient or hospital follow up (I will be out of town for the next week) - he should set up his hospital follow up with cardiologist/previous provider and then offer next new patient visit I have available

## 2022-02-23 NOTE — Progress Notes (Signed)
ANTICOAGULATION CONSULT NOTE  Pharmacy Consult for Heparin > Apixaban Indication: pulmonary embolus and peroneal vein DVT  Allergies  Allergen Reactions   Penicillins Hives, Swelling, Rash and Other (See Comments)    SWELLING REACTION UNSPECIFIED  PATIENT HAS HAD A PCN REACTION WITH IMMEDIATE RASH, FACIAL/TONGUE/THROAT SWELLING, SOB, OR LIGHTHEADEDNESS WITH HYPOTENSION:  #  #  #  YES  #  #  #   Has patient had a PCN reaction causing severe rash involving mucus membranes or skin necrosis: No Had a PCN reaction that required hospitalization:  #  #  #  NO , but Needed to go to [facility] OFFICE # # # Has patient had a PCN reaction occurring within the last 10 years: No      Patient Measurements: Heparin Dosing Weight: 100 kg  Vital Signs: Temp: 98.9 F (37.2 C) (08/02 0810) Temp Source: Oral (08/02 0810) BP: 122/82 (08/02 0810) Pulse Rate: 91 (08/02 0810)  Labs: Recent Labs    02/21/22 1019 02/21/22 1214 02/21/22 2320 02/22/22 0554 02/22/22 1101 02/23/22 0506  HGB 15.8  --   --  14.6  --  15.4  HCT 48.5  --   --  43.3  --  45.8  PLT 216  --   --  197  --  205  HEPARINUNFRC  --   --  0.37  --  0.35 0.51  CREATININE 0.93  --   --   --   --   --   TROPONINIHS 6 6  --   --   --   --     CrCl cannot be calculated (Unknown ideal weight.).    Assessment: 89 YOM presenting with SOB, CT angio of chest with PE, concern for RHS, he is not on anticoagulation PTA, CBC WNL   Heparin remains therapeutic. CBC stable, no issues with heparin infusion or signs of bleeding reported. Will transition to apixaban this AM.   Goal of Therapy:  Heparin level 0.3-0.7 units/ml Monitor platelets by anticoagulation protocol: Yes   Plan:  Stop heparin infusion @ 11:00 Start Apixaban 10mg  x7 days followed by apixaban 5mg  thereafter Continue to monitor H&H and platelets    Of note, copay $100 but has and should qualify for coupon card (will discuss with patient during  education)   Thank you for allowing pharmacy to be a part of this patient's care.  , PharmD Clinical Pharmacist

## 2022-03-10 ENCOUNTER — Other Ambulatory Visit (HOSPITAL_COMMUNITY): Payer: Self-pay

## 2022-03-10 ENCOUNTER — Telehealth (HOSPITAL_BASED_OUTPATIENT_CLINIC_OR_DEPARTMENT_OTHER): Payer: Self-pay

## 2022-03-10 NOTE — Telephone Encounter (Signed)
Please call cell number not house phone.

## 2022-03-10 NOTE — Telephone Encounter (Signed)
Pharmacy Transitions of Care Follow-up Telephone Call  Date of discharge: 02/23/22  Discharge Diagnosis: PE  How have you been since you were released from the hospital? Patient has been doing well. Denies chest pain, but does have mild SOB upon exertion at work, but getting better every day. Has already had follow up appointment with PCP and has picked up his medication refills.   Medication changes made at discharge: START taking these medications  START taking these medications  * Eliquis DVT/PE Starter Pack Generic drug: Apixaban Starter Pack (10mg  and 5mg ) Take as directed on package: start with two-5mg  tablets twice daily for 7 days. On day 8, switch to one-5mg  tablet twice daily.  * apixaban 5 MG Tabs tablet Commonly known as: ELIQUIS Take 1 tablet (5 mg total) by mouth 2 (two) times daily. Start taking on: March 25, 2022   very important  * This list has 2 medication(s) that are the same as other medications prescribed for you. Read the directions carefully, and ask your doctor or other care provider to review them with you.   CONTINUE taking these medications  CONTINUE taking these medications  albuterol 108 (90 Base) MCG/ACT inhaler Commonly known as: VENTOLIN HFA Inhale 1 puff into the lungs 2 (two) times daily as needed for wheezing.  atorvastatin 80 MG tablet Commonly known as: LIPITOR Take 1 tablet (80 mg total) by mouth daily.  clopidogrel 75 MG tablet Commonly known as: PLAVIX Take 1 tablet (75 mg total) by mouth daily.  DAILY MULTIVITAMIN PO  FISH OIL PO  Glucosamine-Chondroitin-MSM 500-200-150 MG Tabs  lisinopril 10 MG tablet Commonly known as: ZESTRIL Take 1 tablet (10 mg total) by mouth daily.  metoprolol tartrate 25 MG tablet Commonly known as: LOPRESSOR Take 1 tablet (25 mg total) by mouth 2 (two) times daily.  nitroGLYCERIN 0.4 MG SL tablet Commonly known as: NITROSTAT Place 1 tablet (0.4 mg total) under the tongue every 5 (five) minutes as needed  for chest pain.   STOP taking these medications  STOP taking these medications  aspirin EC 81 MG tablet    Medication changes verified by the patient? yes    Medication Accessibility:  Home Pharmacy: [id number]   Was the patient provided with refills on discharged medications? no   Have all prescriptions been transferred from [facility] to home pharmacy? N/a   Is the patient interested in using a [place] pharmacy? no  Is the patient able to afford medications? yes  Referred patient to patient care advocate for medication assistance? no  Medication Review:  APIXABAN (ELIQUIS)  Apixaban 10 mg BID initiated on 02/23/22 for PE. Will switch to apixaban 5 mg BID after 7 days (DATE 03/02/22).  - Discussed importance of taking medication around the same time every day.  - Advised patient of medications to avoid (NSAIDs, ASA maintenance doses>100 mg daily)  - Educated that Tylenol (acetaminophen) will be the preferred analgesic to prevent risk of bleeding. - Emphasized importance of monitoring for signs and symptoms of bleeding (abnormal bruising, prolonged bleeding, nose bleeds, bleeding from gums, discolored urine, black tarry stools)  - Advised patient to alert all providers of anticoagulation therapy prior to starting a new medication or having a procedure.  Follow-up Appointments: Has seen PCP 03/09/22  If their condition worsens, is the pt aware to call PCP or go to the Emergency Dept.? yes  Final Patient Assessment: Patient has been doing well. Denies chest pain, but does have mild SOB upon exertion at work, but  getting better every day. Has already had follow up appointment with PCP and has picked up his medication refills.   Jiles Crocker, PharmD Clinical Pharmacist [facility] 03/10/2022 4:25 PM

## 2022-03-15 ENCOUNTER — Telehealth: Payer: Self-pay | Admitting: Hematology and Oncology

## 2022-03-15 NOTE — Telephone Encounter (Signed)
Scheduled appt per 8/22 referral. Pt is aware of appt date and time. Pt is aware to arrive 15 mins prior to appt time and to bring and updated insurance card. Pt is aware of appt location.   

## 2022-03-16 NOTE — Telephone Encounter (Signed)
Patient scheduled for 11/16 at 8:40am just [facility]

## 2022-04-07 ENCOUNTER — Inpatient Hospital Stay: Payer: Commercial Managed Care - PPO | Attending: Hematology and Oncology | Admitting: Hematology and Oncology

## 2022-04-07 ENCOUNTER — Inpatient Hospital Stay: Payer: Commercial Managed Care - PPO

## 2022-04-07 ENCOUNTER — Other Ambulatory Visit: Payer: Self-pay

## 2022-04-07 VITALS — BP 122/82 | HR 76 | Temp 98.3°F | Resp 14 | Wt 236.3 lb

## 2022-04-07 DIAGNOSIS — I2699 Other pulmonary embolism without acute cor pulmonale: Secondary | ICD-10-CM | POA: Diagnosis present

## 2022-04-07 DIAGNOSIS — I82401 Acute embolism and thrombosis of unspecified deep veins of right lower extremity: Secondary | ICD-10-CM | POA: Insufficient documentation

## 2022-04-07 DIAGNOSIS — I10 Essential (primary) hypertension: Secondary | ICD-10-CM | POA: Insufficient documentation

## 2022-04-07 DIAGNOSIS — Z7901 Long term (current) use of anticoagulants: Secondary | ICD-10-CM | POA: Diagnosis not present

## 2022-04-07 DIAGNOSIS — I252 Old myocardial infarction: Secondary | ICD-10-CM | POA: Diagnosis not present

## 2022-04-07 DIAGNOSIS — Z79899 Other long term (current) drug therapy: Secondary | ICD-10-CM | POA: Insufficient documentation

## 2022-04-07 DIAGNOSIS — I2510 Atherosclerotic heart disease of native coronary artery without angina pectoris: Secondary | ICD-10-CM | POA: Diagnosis not present

## 2022-04-07 LAB — CBC WITH DIFFERENTIAL (CANCER CENTER ONLY)
Abs Immature Granulocytes: 0.04 10*3/uL (ref 0.00–0.07)
Basophils Absolute: 0 10*3/uL (ref 0.0–0.1)
Basophils Relative: 0 %
Eosinophils Absolute: 0.2 10*3/uL (ref 0.0–0.5)
Eosinophils Relative: 2 %
HCT: 45.7 % (ref 39.0–52.0)
Hemoglobin: 15.2 g/dL (ref 13.0–17.0)
Immature Granulocytes: 0 %
Lymphocytes Relative: 26 %
Lymphs Abs: 2.6 10*3/uL (ref 0.7–4.0)
MCH: 31.3 pg (ref 26.0–34.0)
MCHC: 33.3 g/dL (ref 30.0–36.0)
MCV: 94 fL (ref 80.0–100.0)
Monocytes Absolute: 0.8 10*3/uL (ref 0.1–1.0)
Monocytes Relative: 8 %
Neutro Abs: 6.3 10*3/uL (ref 1.7–7.7)
Neutrophils Relative %: 64 %
Platelet Count: 193 10*3/uL (ref 150–400)
RBC: 4.86 MIL/uL (ref 4.22–5.81)
RDW: 13.8 % (ref 11.5–15.5)
WBC Count: 10 10*3/uL (ref 4.0–10.5)
nRBC: 0 % (ref 0.0–0.2)

## 2022-04-07 LAB — CMP (CANCER CENTER ONLY)
ALT: 20 U/L (ref 0–44)
AST: 20 U/L (ref 15–41)
Albumin: 4.2 g/dL (ref 3.5–5.0)
Alkaline Phosphatase: 84 U/L (ref 38–126)
Anion gap: 5 (ref 5–15)
BUN: 15 mg/dL (ref 6–20)
CO2: 26 mmol/L (ref 22–32)
Calcium: 9.5 mg/dL (ref 8.9–10.3)
Chloride: 108 mmol/L (ref 98–111)
Creatinine: 0.86 mg/dL (ref 0.61–1.24)
GFR, Estimated: >60 mL/min (ref >60)
Glucose, Bld: 99 mg/dL (ref 70–99)
Potassium: 4 mmol/L (ref 3.5–5.1)
Sodium: 139 mmol/L (ref 135–145)
Total Bilirubin: 0.5 mg/dL (ref 0.3–1.2)
Total Protein: 6.6 g/dL (ref 6.5–8.1)

## 2022-04-07 NOTE — Progress Notes (Signed)
[facility] [phone]   [phone]  INITIAL CONSULT NOTE  Patient Care Team: Patient, No Pcp Per as PCP - General (General Practice) Tonny Bollman, MD as PCP - Cardiology (Cardiology)  Hematological/Oncological History # Unprovoked Pulmonary Emboli  # Right Lower Extremity DVT 02/21/2022: CT angio showed pulmonary emboli as proximally as the distal right main pulmonary artery, with thrombus seen in the right upper and lower lobar and segmental arteries, which is occlusive in the right lower lobe segmental arteries.  02/22/2022: LE US showed acute deep vein thrombosis involving the right popliteal vein, and right peroneal veins. 04/07/2022: establish care with Dr. Leonides Schanz   CHIEF COMPLAINTS/PURPOSE OF CONSULTATION:  "Unprovoked Pulmonary Emboli  "  HISTORY OF PRESENTING ILLNESS:  Mario Underwood 61 y.o. male with medical history significant for CAD, HLD, HTN, MI and OA who presents for evaluation of pulmonary emboli and RLE DVT.  On review of the previous records Mr. Mallick presented to the emergency department on 02/21/2022 with marked shortness of breath.  A CT angio was performed which showed pulmonary emboli as proximal as the distal right main pulmonary artery with thrombus in the right upper and lower lobar and segmental arteries which is occlusive in the right lower segmental arteries.  He had an ultrasound performed the following day which showed acute deep vein thrombus involving the right popliteal vein and right peroneal veins.  Echocardiogram showed no evidence of right heart strain patient was started on Eliquis therapy at time of discharge.  Due to concern for these findings the patient was referred to hematology for further evaluation and management  On exam today Mr. Droke reports that there was nothing unusual that preceded his pulmonary embolism.  He had not had any recent illnesses, prolonged travel, trauma, or surgery.  He reports that on  Monday morning he began feeling "weaker and weaker".  He felt like he was not able to get any air.  He collapsed and his wife took him to the emergency department.  He reports that he had a heart attack before and it "did not feel anything like that".  He reports he was not having any pain or swelling in his right lower extremity.  He just felt very short of breath and low energy.  He notes that he has been dealing with some "extreme heat".  He has been doing his best to stay hydrated.  He notes that he has never had any issues with a blood clot or, other than the fact that he had one of his cardiac stents clot while he was on vacation about 10 years ago.  On further discussion he reports that his family history is negative for VTE.  His mother had hypertension and his father had a stent in his heart in his 34s.  He reports that his sister is healthy and he does not have any children.  He is a never smoker and drinks about 8-10 Michelob ultra beers on the weekend.  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  He was taking baby aspirin when this blood clot occurred but now is on Plavix and Eliquis.  He is tolerating it well without any bleeding, bruising, or dark stools.  He still has some difficulty with breathing he notes he is "not yet 100%".  He otherwise denies any fevers, chills, sweats, nausea, vomiting or diarrhea.  A full 10 point ROS is listed below.  MEDICAL HISTORY:  Past Medical History:  Diagnosis Date  Asthma    once.week   CAD (coronary artery disease)    A.  Inferior STEMI 12/12/10, treated with a Xience DES;  B.  Staged PCI of the LAD with a Promus DES;  C.  EF 55%, inferior AK   Hyperlipemia    Hypertension    Myocardial infarction [facility]) 2012   2 stents placed   Osteoarthritis    Right knee TKR pending with Dr. Charlann Boxer   Pneumonia     SURGICAL HISTORY: Past Surgical History:  Procedure Laterality Date   ARTHROSCOPIC REPAIR ACL Right    CORONARY STENT PLACEMENT  2012    HERNIA REPAIR     left inguinal   TOTAL KNEE ARTHROPLASTY Right 10/07/2013   Procedure: RIGHT TOTAL KNEE ARTHROPLASTY;  Surgeon: Dannielle Huh, MD;  Location: [facility] OR;  Service: Orthopedics;  Laterality: Right;   TOTAL KNEE ARTHROPLASTY Left 09/25/2017   Procedure: LEFT TOTAL KNEE ARTHROPLASTY;  Surgeon: Dannielle Huh, MD;  Location: [facility] OR;  Service: Orthopedics;  Laterality: Left;   urology procedure      SOCIAL HISTORY: Social History   Socioeconomic History   Marital status: Married    Spouse name: Not on file   Number of children: Not on file   Years of education: Not on file   Highest education level: Not on file  Occupational History   Not on file  Tobacco Use   Smoking status: Never   Smokeless tobacco: Current    Types: Chew  Vaping Use   Vaping Use: Never used  Substance and Sexual Activity   Alcohol use: Yes    Comment: weekends   Drug use: No   Sexual activity: Yes  Other Topics Concern   Not on file  Social History Narrative   Not on file   Social Determinants of Health   Financial Resource Strain: Not on file  Food Insecurity: Not on file  Transportation Needs: Not on file  Physical Activity: Not on file  Stress: Not on file  Social Connections: Not on file  Intimate Partner Violence: Not on file    FAMILY HISTORY: Family History  Problem Relation Age of Onset   Coronary artery disease Sister    Heart disease Mother     ALLERGIES:  is allergic to penicillins.  MEDICATIONS:  Current Outpatient Medications  Medication Sig Dispense Refill   albuterol (PROVENTIL HFA;VENTOLIN HFA) 108 (90 BASE) MCG/ACT inhaler Inhale 1 puff into the lungs 2 (two) times daily as needed for wheezing. 1 Inhaler 2   apixaban (ELIQUIS) 5 MG TABS tablet Take 1 tablet (5 mg total) by mouth 2 (two) times daily. 60 tablet 1   atorvastatin (LIPITOR) 80 MG tablet Take 1 tablet (80 mg total) by mouth daily. 90 tablet 3   clopidogrel (PLAVIX) 75 MG tablet Take 1 tablet (75 mg total)  by mouth daily. 90 tablet 3   Glucosamine-Chondroitin-MSM 500-200-150 MG TABS Take 1 tablet by mouth daily.     lisinopril (ZESTRIL) 10 MG tablet Take 1 tablet (10 mg total) by mouth daily. 90 tablet 3   metoprolol tartrate (LOPRESSOR) 25 MG tablet Take 1 tablet (25 mg total) by mouth 2 (two) times daily. 180 tablet 3   Multiple Vitamin (DAILY MULTIVITAMIN PO) Take 1 tablet by mouth daily.     nitroGLYCERIN (NITROSTAT) 0.4 MG SL tablet Place 1 tablet (0.4 mg total) under the tongue every 5 (five) minutes as needed for chest pain. 25 tablet 0   Omega-3 Fatty Acids (FISH OIL PO)  Take 1 capsule by mouth daily.     No current facility-administered medications for this visit.    REVIEW OF SYSTEMS:   Constitutional: ( - ) fevers, ( - )  chills , ( - ) night sweats Eyes: ( - ) blurriness of vision, ( - ) double vision, ( - ) watery eyes Ears, nose, mouth, throat, and face: ( - ) mucositis, ( - ) sore throat Respiratory: ( - ) cough, ( - ) dyspnea, ( - ) wheezes Cardiovascular: ( - ) palpitation, ( - ) chest discomfort, ( - ) lower extremity swelling Gastrointestinal:  ( - ) nausea, ( - ) heartburn, ( - ) change in bowel habits Skin: ( - ) abnormal skin rashes Lymphatics: ( - ) new lymphadenopathy, ( - ) easy bruising Neurological: ( - ) numbness, ( - ) tingling, ( - ) new weaknesses Behavioral/Psych: ( - ) mood change, ( - ) new changes  All other systems were reviewed with the patient and are negative.  PHYSICAL EXAMINATION:  Vitals:   04/07/22 1246  BP: 122/82  Pulse: 76  Resp: 14  Temp: 98.3 F (36.8 C)  SpO2: 98%   Filed Weights   04/07/22 1246  Weight: 236 lb 4.8 oz (107.2 kg)    GENERAL: well appearing middle-aged Caucasian male in NAD  SKIN: skin color, texture, turgor are normal, no rashes or significant lesions EYES: conjunctiva are pink and non-injected, sclera clear LUNGS: clear to auscultation and percussion with normal breathing effort HEART: regular rate & rhythm  and no murmurs and no lower extremity edema Musculoskeletal: no cyanosis of digits and no clubbing  PSYCH: alert & oriented x 3, fluent speech NEURO: no focal motor/sensory deficits  LABORATORY DATA:  I have reviewed the data as listed    Latest Ref Rng & Units 04/07/2022    2:04 PM 02/23/2022    5:06 AM 02/22/2022    5:54 AM  CBC  WBC 4.0 - 10.5 K/uL 10.0  10.1  9.2   Hemoglobin 13.0 - 17.0 g/dL 40.9  81.1  91.4   Hematocrit 39.0 - 52.0 % 45.7  45.8  43.3   Platelets 150 - 400 K/uL 193  205  197        Latest Ref Rng & Units 04/07/2022    2:04 PM 02/21/2022   10:19 AM 10/12/2021    2:40 PM  CMP  Glucose 70 - 99 mg/dL 99  782  85   BUN 6 - 20 mg/dL 15  9  10    Creatinine 0.61 - 1.24 mg/dL  9.56  2.13   Sodium 135 - 145 mmol/L 139  139  140   Potassium 3.5 - 5.1 mmol/L 4.0  4.7  4.3   Chloride 98 - 111 mmol/L 108  105  103   CO2 22 - 32 mmol/L 26  26  21    Calcium 8.9 - 10.3 mg/dL 9.5  9.6  9.6   Total Protein 6.5 - 8.1 g/dL 6.6   7.1   Total Bilirubin 0.3 - 1.2 mg/dL 0.5   0.7   Alkaline Phos 38 - 126 U/L 84   137   AST 15 - 41 U/L 20   16   ALT 0 - 44 U/L 20   19      ASSESSMENT & PLAN KAMAURY CUTBIRTH 61 y.o. male with medical history significant for CAD, HLD, HTN, MI and OA who presents for evaluation of pulmonary emboli and RLE DVT.  After review of the labs, review of the records, and discussion with the patient the patients findings are most consistent with a massive unprovoked pulmonary embolism and right lower extremity DVT.  A provoked venous thromboembolism (VTE) is one that has a clear inciting factor or event. Provoking factors include prolonged travel/immobility, surgery (particular abdominal or orthropedic), trauma,  and pregnancy/ estrogen containing birth control. After a detailed history and review of the records there is no clear provoking factor for this patient's VTE.  Patients with unprovoked VTEs have up to 25% recurrence after 5 years and 36% at 10  years, with 4% of these clots being fatal (BMJ?2019;366:l4363). Therefore the formal recommendation for unprovoked VTE's is lifelong anticoagulation, as the cause may not be transient or reversible. We recommend 6 months or full strength anticoagulation with a re-evaluation after that time.  The patient's will then have a choice of maintenance dose DOAC (preferred, recommended), 81mg  ASA PO daily (non-preferred), or no further anticoagulation (not recommended).    #Unprovoked DVT/Pulmonary Embolism  --findings at this time are consistent with a unprovoked VTE  --will order baseline CMP and CBC to assure labs are adequate for DOAC therapy  --rule out APS with anticardiolipin and anti beta2 glycoprotein antibodies.  Lupus anticoagulant panel would be altered by presence of blood thinner, will hold on this testing.   --Due to history of myocardial infarction's we will also order homocystine levels. --recommend the patient continue eliquis 5mg  BID.  Due to the unprovoked nature of this VTE we will need to continue anticoagulation indefinitely. --patient denies any bleeding, bruising, or dark stools on this medication. It is well tolerated. No difficulties accessing/affording the medication  --RTC in 3 months' time with strict return precautions for overt signs of bleeding.  Orders Placed This Encounter  Procedures   CBC with Differential (Cancer Center Only)    Standing Status:   Future    Number of Occurrences:   1    Standing Expiration Date:   04/08/2023   CMP (Cancer Center only)    Standing Status:   Future    Number of Occurrences:   1    Standing Expiration Date:   04/08/2023   Beta-2-glycoprotein i abs, IgG/M/A    Standing Status:   Future    Number of Occurrences:   1    Standing Expiration Date:   04/07/2023   Cardiolipin antibodies, IgG, IgM, IgA*    Standing Status:   Future    Number of Occurrences:   1    Standing Expiration Date:   04/07/2023   Homocysteine, serum    Standing  Status:   Future    Number of Occurrences:   1    Standing Expiration Date:   04/07/2023    All questions were answered. The patient knows to call the clinic with any problems, questions or concerns.  A total of more than 60 minutes were spent on this encounter with face-to-face time and non-face-to-face time, including preparing to see the patient, ordering tests and/or medications, counseling the patient and coordination of care as outlined above.   04/09/2023, MD Department of Hematology/Oncology [facility] at [facility] Phone: [phone] Pager: [phone] Email: [phone]  04/07/2022 5:28 PM

## 2022-04-08 ENCOUNTER — Telehealth: Payer: Self-pay | Admitting: Hematology and Oncology

## 2022-04-08 LAB — BETA-2-GLYCOPROTEIN I ABS, IGG/M/A
Beta-2 Glyco I IgG: <9 GPI IgG units (ref 0–20)
Beta-2-Glycoprotein I IgA: <9 GPI IgA units (ref 0–25)
Beta-2-Glycoprotein I IgM: <9 GPI IgM units (ref 0–32)

## 2022-04-08 LAB — HOMOCYSTEINE: Homocysteine: 12.3 umol/L (ref 0.0–14.5)

## 2022-04-08 LAB — CARDIOLIPIN ANTIBODIES, IGG, IGM, IGA
Anticardiolipin IgA: <9 APL U/mL (ref 0–11)
Anticardiolipin IgG: <9 GPL U/mL (ref 0–14)
Anticardiolipin IgM: <9 MPL U/mL (ref 0–12)

## 2022-04-08 NOTE — Telephone Encounter (Signed)
PER 9/14 LOS CALLED AND LEFT MESSAGE FOR PT ABOUT APPOINTMENT

## 2022-04-12 ENCOUNTER — Ambulatory Visit: Payer: Commercial Managed Care - PPO | Admitting: Physician Assistant

## 2022-06-09 ENCOUNTER — Ambulatory Visit: Payer: Commercial Managed Care - PPO | Attending: Cardiology | Admitting: Cardiology

## 2022-06-09 ENCOUNTER — Encounter: Payer: Self-pay | Admitting: Cardiology

## 2022-06-09 VITALS — BP 119/77 | HR 72 | Ht 71.0 in | Wt 230.0 lb

## 2022-06-09 DIAGNOSIS — G4719 Other hypersomnia: Secondary | ICD-10-CM

## 2022-06-09 DIAGNOSIS — G4733 Obstructive sleep apnea (adult) (pediatric): Secondary | ICD-10-CM | POA: Diagnosis not present

## 2022-06-09 DIAGNOSIS — I10 Essential (primary) hypertension: Secondary | ICD-10-CM | POA: Diagnosis not present

## 2022-06-09 NOTE — Progress Notes (Signed)
SLEEP MEDICINE VIRTUAL CONSULT NOTE via Video Note   Because of Mario Underwood's co-morbid illnesses, he is at least at moderate risk for complications without adequate follow up.  This format is felt to be most appropriate for this patient at this time.  All issues noted in this document were discussed and addressed.  A limited physical exam was performed with this format.  Please refer to the patient's chart for his consent to telehealth for [facility].      Date:  06/09/2022   ID:  Mario Underwood, DOB 1961/03/01, MRN [id number] The patient was identified using 2 identifiers.  Patient Location: Home Provider Location: Home Office   PCP:  Patient, No Pcp Per   [facility] Providers Cardiologist:  Tonny Bollman, MD Sleep Medicine:  Armanda Magic, MD     Evaluation Performed:  Consultation - Mario Underwood was referred by OSA for the evaluation of Tonny Bollman, MD.  Chief Complaint:  OSA  History of Present Illness:    Mario Underwood is a 61 y.o. male who is being seen today for the evaluation of OSA at the request of Tonny Bollman, MD.  Mario Underwood is a 61 y.o. male with a hx of ASCAD, HLD, HTN and asthma who was seen by Dr. Excell Seltzer in March 2023 and complained of sleeping poorly and excessive daytime sleepiness.  He ordered a HST which showed mild OSA with an AHI of 6.1/hr and O2 sats as low as 83%.  He is now referred to establish care with Sleep specialist to discuss results of his sleep study.   He tells me that he goes to bed at 8-9pm and falls asleep within 15-20min.  He then sleeps for about 1-2 hours and then wakes up and then falls back asleep and then wakes up again.  He wakes up at least 5-7 times nightly.  He feels exhausted when he wakes up and is very sleepy during the day. He tells me that he has not dry mouth and does not think that he breathes through his mouth.  His wife says that he does not snore.   Past Medical History:   Diagnosis Date   Asthma    once.week   CAD (coronary artery disease)    A.  Inferior STEMI 12/12/10, treated with a Xience DES;  B.  Staged PCI of the LAD with a Promus DES;  C.  EF 55%, inferior AK   Hyperlipemia    Hypertension    Myocardial infarction [facility]) 2012   2 stents placed   Osteoarthritis    Right knee TKR pending with Dr. Charlann Boxer   Pneumonia    Past Surgical History:  Procedure Laterality Date   ARTHROSCOPIC REPAIR ACL Right    CORONARY STENT PLACEMENT  2012   HERNIA REPAIR     left inguinal   TOTAL KNEE ARTHROPLASTY Right 10/07/2013   Procedure: RIGHT TOTAL KNEE ARTHROPLASTY;  Surgeon: Dannielle Huh, MD;  Location: [facility] OR;  Service: Orthopedics;  Laterality: Right;   TOTAL KNEE ARTHROPLASTY Left 09/25/2017   Procedure: LEFT TOTAL KNEE ARTHROPLASTY;  Surgeon: Dannielle Huh, MD;  Location: [facility] OR;  Service: Orthopedics;  Laterality: Left;   urology procedure       Current Meds  Medication Sig   albuterol (PROVENTIL HFA;VENTOLIN HFA) 108 (90 BASE) MCG/ACT inhaler Inhale 1 puff into the lungs 2 (two) times daily as needed for wheezing.   apixaban (ELIQUIS) 5 MG TABS  tablet Take 1 tablet (5 mg total) by mouth 2 (two) times daily.   atorvastatin (LIPITOR) 80 MG tablet Take 1 tablet (80 mg total) by mouth daily.   clopidogrel (PLAVIX) 75 MG tablet Take 1 tablet (75 mg total) by mouth daily.   Glucosamine-Chondroitin-MSM 500-200-150 MG TABS Take 1 tablet by mouth daily.   lisinopril (ZESTRIL) 10 MG tablet Take 1 tablet (10 mg total) by mouth daily.   metoprolol tartrate (LOPRESSOR) 25 MG tablet Take 1 tablet (25 mg total) by mouth 2 (two) times daily.   Multiple Vitamin (DAILY MULTIVITAMIN PO) Take 1 tablet by mouth daily.   nitroGLYCERIN (NITROSTAT) 0.4 MG SL tablet Place 1 tablet (0.4 mg total) under the tongue every 5 (five) minutes as needed for chest pain.   Omega-3 Fatty Acids (FISH OIL PO) Take 1 capsule by mouth daily.     Allergies:   Penicillins   Social History    Tobacco Use   Smoking status: Never   Smokeless tobacco: Current    Types: Chew  Vaping Use   Vaping Use: Never used  Substance Use Topics   Alcohol use: Yes    Comment: weekends   Drug use: No     Family Hx: The patient's family history includes Coronary artery disease in his sister; Heart disease in his mother.  ROS:   Please see the history of present illness.     All other systems reviewed and are negative.   Prior Sleep studies:   The following studies were reviewed today:  HST 10/2021 FINDINGS: 1. Mild Obstructive Sleep Apnea with AHI 6.1/hr. 2. No Central apneas were present. 3. Lowest oxygen saturation was 83%. 4. Mild snoring was present. O2 sats were < 88% for 1.2 min. 5. Total sleep time was 6hrs and 16 min. 6. 22.6% of total sleep time was spent in REM sleep. 7. Shortened sleep onset latency at 5 min. 8. Shortened REM sleep onset latency at 56 min. 9. Total awakenings were 11.   DIAGNOSIS: Mild Obstructive Sleep Apnea (G47.33)    Labs/Other Tests and Data Reviewed:     Recent Labs: 02/21/2022: B Natriuretic Peptide 27.8 04/07/2022: ALT 20; BUN 15; Creatinine 0.86; Hemoglobin 15.2; Platelet Count 193; Potassium 4.0; Sodium 139    Wt Readings from Last 3 Encounters:  06/09/22 230 lb (104.3 kg)  04/07/22 236 lb 4.8 oz (107.2 kg)  10/12/21 240 lb 3.2 oz (109 kg)     Risk Assessment/Calculations:      STOP-Bang Score:  5      Objective:    Vital Signs:  BP 119/77   Pulse 72   Ht 5\' 11"  (1.803 m)   Wt 230 lb (104.3 kg)   BMI 32.08 kg/m    VITAL SIGNS:  reviewed GEN:  no acute distress EYES:  sclerae anicteric, EOMI - Extraocular Movements Intact RESPIRATORY:  normal respiratory effort, symmetric expansion CARDIOVASCULAR:  no peripheral edema SKIN:  no rash, lesions or ulcers. MUSCULOSKELETAL:  no obvious deformities. NEURO:  alert and oriented x 3, no obvious focal deficit PSYCH:  normal affect  ASSESSMENT & PLAN:    OSA /Excessive  daytime sleepiness -patient complains of excessive daytime sleepiness with STOPBANG score of 5 -he has very frequent awakenings at night which I suspect is related to OSA and his daytime sleepiness is related to not getting into REM sleep during the night -HST showed mild OSA with AHI 6/hr and O2 sats as low as 82% -I have recommended that we start  auto CPAP therapy from 4 to 10cm H2O using a nasal pillow mask and get a download in 4 weeks -I will see him back in 6 weeks after getting his device  HTN -BP controlled on exam today -continue prescription drug management with Lisinopril 10mg  daily and Lopressor 25mg  BID with PRN refills   Time:   Today, I have spent 15 minutes with the patient with telehealth technology discussing the above problems.     Medication Adjustments/Labs and Tests Ordered: Current medicines are reviewed at length with the patient today.  Concerns regarding medicines are outlined above.   Tests Ordered: No orders of the defined types were placed in this encounter.   Medication Changes: No orders of the defined types were placed in this encounter.   Follow Up:  In Person in 1 year(s)  Signed, , MD  06/09/2022 8:39 AM    [facility]

## 2022-06-09 NOTE — Patient Instructions (Signed)
Medication Instructions:  Your physician recommends that you continue on your current medications as directed. Please refer to the Current Medication list given to you today.  *If you need a refill on your cardiac medications before your next appointment, please call your pharmacy*   Follow-Up: At [facility], you and your health needs are our priority.  As part of our continuing mission to provide you with exceptional heart care, we have created designated Provider Care Teams.  These Care Teams include your primary Cardiologist (physician) and Advanced Practice Providers (APPs -  Physician Assistants and Nurse Practitioners) who all work together to provide you with the care you need, when you need it.   Your next appointment:   As scheduled   Provider:   Dr. Mayford Knife   Important Information About Sugar

## 2022-06-21 ENCOUNTER — Encounter: Payer: Self-pay | Admitting: Cardiology

## 2022-06-21 DIAGNOSIS — G4733 Obstructive sleep apnea (adult) (pediatric): Secondary | ICD-10-CM

## 2022-06-21 DIAGNOSIS — G4719 Other hypersomnia: Secondary | ICD-10-CM

## 2022-06-21 DIAGNOSIS — I2510 Atherosclerotic heart disease of native coronary artery without angina pectoris: Secondary | ICD-10-CM

## 2022-07-08 ENCOUNTER — Inpatient Hospital Stay (HOSPITAL_BASED_OUTPATIENT_CLINIC_OR_DEPARTMENT_OTHER): Payer: Commercial Managed Care - PPO | Admitting: Hematology and Oncology

## 2022-07-08 ENCOUNTER — Inpatient Hospital Stay: Payer: Commercial Managed Care - PPO | Attending: Hematology and Oncology

## 2022-07-08 ENCOUNTER — Other Ambulatory Visit: Payer: Self-pay | Admitting: Hematology and Oncology

## 2022-07-08 ENCOUNTER — Other Ambulatory Visit: Payer: Self-pay

## 2022-07-08 VITALS — BP 120/77 | HR 63 | Temp 98.0°F | Resp 14 | Wt 235.0 lb

## 2022-07-08 DIAGNOSIS — I2699 Other pulmonary embolism without acute cor pulmonale: Secondary | ICD-10-CM | POA: Insufficient documentation

## 2022-07-08 DIAGNOSIS — I82401 Acute embolism and thrombosis of unspecified deep veins of right lower extremity: Secondary | ICD-10-CM | POA: Insufficient documentation

## 2022-07-08 DIAGNOSIS — Z79899 Other long term (current) drug therapy: Secondary | ICD-10-CM | POA: Insufficient documentation

## 2022-07-08 DIAGNOSIS — R0602 Shortness of breath: Secondary | ICD-10-CM | POA: Insufficient documentation

## 2022-07-08 LAB — CMP (CANCER CENTER ONLY)
ALT: 21 U/L (ref 0–44)
AST: 20 U/L (ref 15–41)
Albumin: 4.2 g/dL (ref 3.5–5.0)
Alkaline Phosphatase: 105 U/L (ref 38–126)
Anion gap: 5 (ref 5–15)
BUN: 10 mg/dL (ref 6–20)
CO2: 27 mmol/L (ref 22–32)
Calcium: 9.7 mg/dL (ref 8.9–10.3)
Chloride: 107 mmol/L (ref 98–111)
Creatinine: 0.82 mg/dL (ref 0.61–1.24)
GFR, Estimated: >60 mL/min (ref >60)
Glucose, Bld: 93 mg/dL (ref 70–99)
Potassium: 4.1 mmol/L (ref 3.5–5.1)
Sodium: 139 mmol/L (ref 135–145)
Total Bilirubin: 0.8 mg/dL (ref 0.3–1.2)
Total Protein: 6.5 g/dL (ref 6.5–8.1)

## 2022-07-08 LAB — CBC WITH DIFFERENTIAL (CANCER CENTER ONLY)
Abs Immature Granulocytes: 0.02 10*3/uL (ref 0.00–0.07)
Basophils Absolute: 0 10*3/uL (ref 0.0–0.1)
Basophils Relative: 1 %
Eosinophils Absolute: 0.1 10*3/uL (ref 0.0–0.5)
Eosinophils Relative: 2 %
HCT: 45.8 % (ref 39.0–52.0)
Hemoglobin: 16 g/dL (ref 13.0–17.0)
Immature Granulocytes: 0 %
Lymphocytes Relative: 24 %
Lymphs Abs: 2 10*3/uL (ref 0.7–4.0)
MCH: 32.1 pg (ref 26.0–34.0)
MCHC: 34.9 g/dL (ref 30.0–36.0)
MCV: 92 fL (ref 80.0–100.0)
Monocytes Absolute: 0.8 10*3/uL (ref 0.1–1.0)
Monocytes Relative: 9 %
Neutro Abs: 5.5 10*3/uL (ref 1.7–7.7)
Neutrophils Relative %: 64 %
Platelet Count: 213 10*3/uL (ref 150–400)
RBC: 4.98 MIL/uL (ref 4.22–5.81)
RDW: 13.4 % (ref 11.5–15.5)
WBC Count: 8.4 10*3/uL (ref 4.0–10.5)
nRBC: 0 % (ref 0.0–0.2)

## 2022-07-08 NOTE — Progress Notes (Signed)
[facility] [phone]   [phone]  PROGRESS NOTE  Patient Care Team: Patient, No Pcp Per as PCP - General (General Practice) Tonny Bollman, MD as PCP - Cardiology (Cardiology) Quintella Reichert, MD as PCP - Sleep Medicine (Cardiology)  Hematological/Oncological History # Unprovoked Pulmonary Emboli  # Right Lower Extremity DVT 02/21/2022: CT angio showed pulmonary emboli as proximally as the distal right main pulmonary artery, with thrombus seen in the right upper and lower lobar and segmental arteries, which is occlusive in the right lower lobe segmental arteries.  02/22/2022: LE US showed acute deep vein thrombosis involving the right popliteal vein, and right peroneal veins. 04/07/2022: establish care with Dr. Leonides Schanz   Interval History:  Mario Underwood 61 y.o. male with medical history significant for an unprovoked pulmonary emboli and right lower extremity DVT who presents for a follow up visit. The patient's last visit was on 04/07/2022 at which time he established care. In the interim since the last visit he has continued faithfully on Eliquis anticoagulation.  On exam today Mario Underwood is accompanied by his wife.  He reports that he has been well in the interim since her last visit.  He continues to have shortness of breath however.  He reports that his breathing is "about the same".  He reports that he is able to walk a good distance but is limited by worsening shortness of breath.  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  He notes that while trying to hunt his energy is "drained".  He notes that he does have some occasional bleeding from bumps or bruises but no spontaneous bleeding such as nosebleeds, gum bleeding, or dark stools.  He reports that he is not having any signs or symptoms concerning for recurrent clot with no leg pain, leg swelling, or chest pain.  He otherwise denies any fevers, chills, sweats, nausea,  vomiting or diarrhea.  A full 10 point ROS was otherwise negative.  MEDICAL HISTORY:  Past Medical History:  Diagnosis Date   Asthma    once.week   CAD (coronary artery disease)    A.  Inferior STEMI 12/12/10, treated with a Xience DES;  B.  Staged PCI of the LAD with a Promus DES;  C.  EF 55%, inferior AK   Hyperlipemia    Hypertension    Myocardial infarction [facility]) 2012   2 stents placed   Osteoarthritis    Right knee TKR pending with Dr. Charlann Boxer   Pneumonia     SURGICAL HISTORY: Past Surgical History:  Procedure Laterality Date   ARTHROSCOPIC REPAIR ACL Right    CORONARY STENT PLACEMENT  2012   HERNIA REPAIR     left inguinal   TOTAL KNEE ARTHROPLASTY Right 10/07/2013   Procedure: RIGHT TOTAL KNEE ARTHROPLASTY;  Surgeon: Dannielle Huh, MD;  Location: [facility] OR;  Service: Orthopedics;  Laterality: Right;   TOTAL KNEE ARTHROPLASTY Left 09/25/2017   Procedure: LEFT TOTAL KNEE ARTHROPLASTY;  Surgeon: Dannielle Huh, MD;  Location: [facility] OR;  Service: Orthopedics;  Laterality: Left;   urology procedure      SOCIAL HISTORY: Social History   Socioeconomic History   Marital status: Married    Spouse name: Not on file   Number of children: Not on file   Years of education: Not on file   Highest education level: Not on file  Occupational History   Not on file  Tobacco Use   Smoking status: Never  Smokeless tobacco: Current    Types: Chew  Vaping Use   Vaping Use: Never used  Substance and Sexual Activity   Alcohol use: Yes    Comment: weekends   Drug use: No   Sexual activity: Yes  Other Topics Concern   Not on file  Social History Narrative   Not on file   Social Determinants of Health   Financial Resource Strain: Not on file  Food Insecurity: Not on file  Transportation Needs: Not on file  Physical Activity: Not on file  Stress: Not on file  Social Connections: Not on file  Intimate Partner Violence: Not on file    FAMILY HISTORY: Family History  Problem Relation Age  of Onset   Coronary artery disease Sister    Heart disease Mother     ALLERGIES:  is allergic to penicillins.  MEDICATIONS:  Current Outpatient Medications  Medication Sig Dispense Refill   albuterol (PROVENTIL HFA;VENTOLIN HFA) 108 (90 BASE) MCG/ACT inhaler Inhale 1 puff into the lungs 2 (two) times daily as needed for wheezing. 1 Inhaler 2   apixaban (ELIQUIS) 5 MG TABS tablet Take 1 tablet (5 mg total) by mouth 2 (two) times daily. 60 tablet 1   atorvastatin (LIPITOR) 80 MG tablet Take 1 tablet (80 mg total) by mouth daily. 90 tablet 3   clopidogrel (PLAVIX) 75 MG tablet Take 1 tablet (75 mg total) by mouth daily. 90 tablet 3   Glucosamine-Chondroitin-MSM 500-200-150 MG TABS Take 1 tablet by mouth daily.     lisinopril (ZESTRIL) 10 MG tablet Take 1 tablet (10 mg total) by mouth daily. 90 tablet 3   metoprolol tartrate (LOPRESSOR) 25 MG tablet Take 1 tablet (25 mg total) by mouth 2 (two) times daily. 180 tablet 3   Multiple Vitamin (DAILY MULTIVITAMIN PO) Take 1 tablet by mouth daily.     nitroGLYCERIN (NITROSTAT) 0.4 MG SL tablet Place 1 tablet (0.4 mg total) under the tongue every 5 (five) minutes as needed for chest pain. 25 tablet 0   Omega-3 Fatty Acids (FISH OIL PO) Take 1 capsule by mouth daily.     No current facility-administered medications for this visit.    REVIEW OF SYSTEMS:   Constitutional: ( - ) fevers, ( - )  chills , ( - ) night sweats Eyes: ( - ) blurriness of vision, ( - ) double vision, ( - ) watery eyes Ears, nose, mouth, throat, and face: ( - ) mucositis, ( - ) sore throat Respiratory: ( - ) cough, ( - ) dyspnea, ( - ) wheezes Cardiovascular: ( - ) palpitation, ( - ) chest discomfort, ( - ) lower extremity swelling Gastrointestinal:  ( - ) nausea, ( - ) heartburn, ( - ) change in bowel habits Skin: ( - ) abnormal skin rashes Lymphatics: ( - ) new lymphadenopathy, ( - ) easy bruising Neurological: ( - ) numbness, ( - ) tingling, ( - ) new  weaknesses Behavioral/Psych: ( - ) mood change, ( - ) new changes  All other systems were reviewed with the patient and are negative.  PHYSICAL EXAMINATION:  Vitals:   07/08/22 1027  BP: 120/77  Pulse: 63  Resp: 14  Temp: 98 F (36.7 C)  SpO2: 97%   Filed Weights   07/08/22 1027  Weight: 235 lb (106.6 kg)    GENERAL: Well-appearing middle-aged Caucasian male, alert, no distress and comfortable SKIN: skin color, texture, turgor are normal, no rashes or significant lesions EYES: conjunctiva are pink and  non-injected, sclera clear LUNGS: clear to auscultation and percussion with normal breathing effort HEART: regular rate & rhythm and no murmurs and no lower extremity edema Musculoskeletal: no cyanosis of digits and no clubbing  PSYCH: alert & oriented x 3, fluent speech NEURO: no focal motor/sensory deficits  LABORATORY DATA:  I have reviewed the data as listed    Latest Ref Rng & Units 07/08/2022    9:57 AM 04/07/2022    2:04 PM 02/23/2022    5:06 AM  CBC  WBC 4.0 - 10.5 K/uL 8.4  10.0  10.1   Hemoglobin 13.0 - 17.0 g/dL 16.0  15.2  15.4   Hematocrit 39.0 - 52.0 % 45.8  45.7  45.8   Platelets 150 - 400 K/uL 213  193  205        Latest Ref Rng & Units 07/08/2022    9:57 AM 04/07/2022    2:04 PM 02/21/2022   10:19 AM  CMP  Glucose 70 - 99 mg/dL 93  99  100   BUN 6 - 20 mg/dL 10  15  9    Creatinine 0.61 - 1.24 mg/dL 0.82  0.86  0.93   Sodium 135 - 145 mmol/L 139  139  139   Potassium 3.5 - 5.1 mmol/L 4.1  4.0  4.7   Chloride 98 - 111 mmol/L 107  108  105   CO2 22 - 32 mmol/L 27  26  26    Calcium 8.9 - 10.3 mg/dL 9.7  9.5  9.6   Total Protein 6.5 - 8.1 g/dL 6.5  6.6    Total Bilirubin 0.3 - 1.2 mg/dL 0.8  0.5    Alkaline Phos 38 - 126 U/L 105  84    AST 15 - 41 U/L 20  20    ALT 0 - 44 U/L 21  20     RADIOGRAPHIC STUDIES: No results found.  ASSESSMENT & PLAN Mario Underwood 61 y.o. male with medical history significant for an unprovoked pulmonary emboli and  right lower extremity DVT who presents for a follow up visit.  After review of the labs, review of the records, and discussion with the patient the patients findings are most consistent with a massive unprovoked pulmonary embolism and right lower extremity DVT.   A provoked venous thromboembolism (VTE) is one that has a clear inciting factor or event. Provoking factors include prolonged travel/immobility, surgery (particular abdominal or orthropedic), trauma,  and pregnancy/ estrogen containing birth control. After a detailed history and review of the records there is no clear provoking factor for this patient's VTE.  Patients with unprovoked VTEs have up to 25% recurrence after 5 years and 36% at 10 years, with 4% of these clots being fatal (BMJ?2019;366:l4363). Therefore the formal recommendation for unprovoked VTE's is lifelong anticoagulation, as the cause may not be transient or reversible. We recommend 6 months or full strength anticoagulation with a re-evaluation after that time.  The patient's will then have a choice of maintenance dose DOAC (preferred, recommended), 81mg  ASA PO daily (non-preferred), or no further anticoagulation (not recommended).     #Unprovoked DVT/Pulmonary Embolism  --findings at this time are consistent with a unprovoked VTE  --will order at each visit CMP and CBC to assure labs are adequate for DOAC therapy  --ruled out APS with negative anticardiolipin and anti beta2 glycoprotein antibodies.  Lupus anticoagulant panel would be altered by presence of blood thinner, will hold on this testing.   --recommend the patient continue eliquis 5mg   BID.  Due to the unprovoked nature of this VTE we will need to continue anticoagulation indefinitely. --Due to continued symptoms would recommend repeat CT PE study.  Concern for possible residual clot versus CTEPH --patient denies any bleeding, bruising, or dark stools on this medication. It is well tolerated. No difficulties  accessing/affording the medication  --Labs today show white blood cell 8.4, hemoglobin 16.0, MCV 92, and platelets 213 --RTC in 3 months' time with strict return precautions for overt signs of bleeding.  Orders Placed This Encounter  Procedures   CT Angio Chest Pulmonary Embolism (PE) W or WO Contrast    Standing Status:   Future    Standing Expiration Date:   07/09/2023    Order Specific Question:   If indicated for the ordered procedure, I authorize the administration of contrast media per Radiology protocol    Answer:   Yes    Order Specific Question:   Does the patient have a contrast media/X-ray dye allergy?    Answer:   No    Order Specific Question:   Preferred imaging location?    Answer:   [facility]    All questions were answered. The patient knows to call the clinic with any problems, questions or concerns.  A total of more than 30 minutes were spent on this encounter with face-to-face time and non-face-to-face time, including preparing to see the patient, ordering tests and/or medications, counseling the patient and coordination of care as outlined above.   Ledell Peoples, MD Department of Hematology/Oncology [place] [facility] Phone: [phone] Pager: [phone] Email: [email or web address]  07/08/2022 5:18 PM

## 2022-07-11 ENCOUNTER — Telehealth: Payer: Self-pay | Admitting: *Deleted

## 2022-07-11 DIAGNOSIS — G4719 Other hypersomnia: Secondary | ICD-10-CM

## 2022-07-11 DIAGNOSIS — G4733 Obstructive sleep apnea (adult) (pediatric): Secondary | ICD-10-CM

## 2022-07-11 DIAGNOSIS — I10 Essential (primary) hypertension: Secondary | ICD-10-CM

## 2022-07-11 LAB — COLOGUARD: COLOGUARD: NEGATIVE

## 2022-07-11 NOTE — Telephone Encounter (Signed)
Per Dr Mayford Knife,  I have recommended that we start auto CPAP therapy from 4 to 10cm H2O using a nasal pillow mask and get a download in 4 weeks -I will see him back in 6 weeks after getting his device   DME selection is [facility]. Patient understands he will be contacted by [facility] to set up his cpap. Patient understands to call if [facility] does not contact him with new setup in a timely manner. Patient understands they will be called once confirmation has been received from [facility]/ that they have received their new machine to schedule 10 week follow up appointment.   [facility] notified of new cpap order  Please add to airview Patient was grateful for the call and thanked me.

## 2022-07-20 ENCOUNTER — Ambulatory Visit (HOSPITAL_COMMUNITY)
Admission: RE | Admit: 2022-07-20 | Discharge: 2022-07-20 | Disposition: A | Discharge status: Home / Self Care | Payer: Commercial Managed Care - PPO | Source: Ambulatory Visit | Attending: Hematology and Oncology | Admitting: Hematology and Oncology

## 2022-07-20 DIAGNOSIS — I2699 Other pulmonary embolism without acute cor pulmonale: Secondary | ICD-10-CM | POA: Insufficient documentation

## 2022-07-20 MED ORDER — SODIUM CHLORIDE (PF) 0.9 % IJ SOLN
INTRAMUSCULAR | Status: AC
  Filled 2022-07-20: qty 50

## 2022-07-20 MED ORDER — IOHEXOL 350 MG/ML SOLN
75.0000 mL | Freq: Once | INTRAVENOUS | Status: AC | PRN
  Administered 2022-07-20: 75 mL via INTRAVENOUS

## 2022-07-21 ENCOUNTER — Telehealth: Payer: Self-pay | Admitting: *Deleted

## 2022-07-21 ENCOUNTER — Other Ambulatory Visit: Payer: Self-pay | Admitting: Hematology and Oncology

## 2022-07-21 DIAGNOSIS — I2699 Other pulmonary embolism without acute cor pulmonale: Secondary | ICD-10-CM

## 2022-07-21 DIAGNOSIS — R0602 Shortness of breath: Secondary | ICD-10-CM

## 2022-07-21 NOTE — Telephone Encounter (Signed)
TCT patient regarding recent scan results.   Advised that the CTA study showed no evidence of residual blood clot. The reason for his continued respiratory symptoms is unclear. Dr. Leonides Schanz recommends that he be evaluated by pulmonology. A referral has been placed to pulmonology today.  Pt voiced understanding. No questions or concerns.

## 2022-07-21 NOTE — Telephone Encounter (Signed)
-----   Message from Jaci Standard, MD sent at 07/21/2022  9:57 AM EST ----- Please let Mr. Elvin know that the CTA study showed no evidence of residual blood clot.  The reason for his continued respiratory symptoms is unclear.  I would recommend that he be evaluated by pulmonology.  A referral has been placed to pulmonology today.  ----- Message ----- From: Interface, Rad Results In Sent: 07/20/2022   3:30 PM EST To: Jaci Standard, MD

## 2022-08-01 ENCOUNTER — Telehealth: Payer: Self-pay | Admitting: Hematology and Oncology

## 2022-08-01 NOTE — Telephone Encounter (Signed)
Called patient to r/s 3/11 appointment due to provider PAL. Patient r/s and notified.  

## 2022-10-03 ENCOUNTER — Other Ambulatory Visit: Payer: Commercial Managed Care - PPO

## 2022-10-03 ENCOUNTER — Ambulatory Visit: Payer: Commercial Managed Care - PPO | Admitting: Hematology and Oncology

## 2022-10-09 ENCOUNTER — Other Ambulatory Visit: Payer: Self-pay | Admitting: Hematology and Oncology

## 2022-10-09 ENCOUNTER — Encounter: Payer: Self-pay | Admitting: Cardiovascular Disease

## 2022-10-09 DIAGNOSIS — I2699 Other pulmonary embolism without acute cor pulmonale: Secondary | ICD-10-CM

## 2022-10-09 NOTE — Progress Notes (Signed)
[facility] Telephone:([phone]) [phone]   Fax:([phone]) [phone]  PROGRESS NOTE  Patient Care Team: Marcell Anger, NP as PCP - General (Internal Medicine) Tonny Bollman, MD as PCP - Cardiology (Cardiology) Quintella Reichert, MD as PCP - Sleep Medicine (Cardiology)  Hematological/Oncological History # Unprovoked Pulmonary Emboli  # Right Lower Extremity DVT 02/21/2022: CT angio showed pulmonary emboli as proximally as the distal right main pulmonary artery, with thrombus seen in the right upper and lower lobar and segmental arteries, which is occlusive in the right lower lobe segmental arteries.  02/22/2022: LE US showed acute deep vein thrombosis involving the right popliteal vein, and right peroneal veins. 04/07/2022: establish care with Dr. Leonides Schanz   Interval History:  Mario Underwood 62 y.o. male with medical history significant for an unprovoked pulmonary emboli and right lower extremity DVT who presents for a follow up visit. The patient's last visit was on 07/08/2022. In the interim since the last visit he has continued faithfully on Eliquis anticoagulation.  On exam today Mario Underwood is accompanied by his wife.  He reports he has been doing much better in the interim since her last visit.  He reports that his shortness of breath "cleared up" without any intervention.  He is unsure what made the difference.  He reports that he did have a visit scheduled with pulmonology but he canceled it.  He reports he is tolerating Eliquis well though he is having some bruising on his upper extremities.  He is not having any overt bleeding, such as nosebleeds, blood in the urine, or blood in the stool.  He reports that he thinks maybe the shortness of breath persisted because of his COVID infection which occurred 2 weeks after the blood clot.  He notes that he is not having any issues with cost with the Eliquis.  Discussing approximate $10 per month.  He is not having any signs or symptoms  concerning for new clot such as chest pain, shortness of breath, leg swelling, or leg pain.  Overall he feels well and is willing and able to proceed with Eliquis therapy at this time.Marland Kitchen  He otherwise denies any fevers, chills, sweats, nausea, vomiting or diarrhea.  A full 10 point ROS was otherwise negative.  MEDICAL HISTORY:  Past Medical History:  Diagnosis Date   Asthma    once.week   CAD (coronary artery disease)    A.  Inferior STEMI 12/12/10, treated with a Xience DES;  B.  Staged PCI of the LAD with a Promus DES;  C.  EF 55%, inferior AK   Hyperlipemia    Hypertension    Myocardial infarction [facility]) 2012   2 stents placed   Osteoarthritis    Right knee TKR pending with Dr. Charlann Boxer   Pneumonia     SURGICAL HISTORY: Past Surgical History:  Procedure Laterality Date   ARTHROSCOPIC REPAIR ACL Right    CORONARY STENT PLACEMENT  2012   HERNIA REPAIR     left inguinal   TOTAL KNEE ARTHROPLASTY Right 10/07/2013   Procedure: RIGHT TOTAL KNEE ARTHROPLASTY;  Surgeon: Dannielle Huh, MD;  Location: [facility] OR;  Service: Orthopedics;  Laterality: Right;   TOTAL KNEE ARTHROPLASTY Left 09/25/2017   Procedure: LEFT TOTAL KNEE ARTHROPLASTY;  Surgeon: Dannielle Huh, MD;  Location: [facility] OR;  Service: Orthopedics;  Laterality: Left;   urology procedure      SOCIAL HISTORY: Social History   Socioeconomic History   Marital status: Married    Spouse name: Not on  file   Number of children: Not on file   Years of education: Not on file   Highest education level: Not on file  Occupational History   Not on file  Tobacco Use   Smoking status: Never   Smokeless tobacco: Current    Types: Chew  Vaping Use   Vaping Use: Never used  Substance and Sexual Activity   Alcohol use: Yes    Comment: weekends   Drug use: No   Sexual activity: Yes  Other Topics Concern   Not on file  Social History Narrative   Not on file   Social Determinants of Health   Financial Resource Strain: Not on file  Food  Insecurity: Not on file  Transportation Needs: Not on file  Physical Activity: Not on file  Stress: Not on file  Social Connections: Not on file  Intimate Partner Violence: Not on file    FAMILY HISTORY: Family History  Problem Relation Age of Onset   Coronary artery disease Sister    Heart disease Mother     ALLERGIES:  is allergic to penicillins.  MEDICATIONS:  Current Outpatient Medications  Medication Sig Dispense Refill   apixaban (ELIQUIS) 2.5 MG TABS tablet Take 1 tablet (2.5 mg total) by mouth 2 (two) times daily. 180 tablet 1   albuterol (PROVENTIL HFA;VENTOLIN HFA) 108 (90 BASE) MCG/ACT inhaler Inhale 1 puff into the lungs 2 (two) times daily as needed for wheezing. 1 Inhaler 2   atorvastatin (LIPITOR) 80 MG tablet Take 1 tablet (80 mg total) by mouth daily. 90 tablet 3   clopidogrel (PLAVIX) 75 MG tablet Take 1 tablet (75 mg total) by mouth daily. 90 tablet 3   Glucosamine-Chondroitin-MSM 500-200-150 MG TABS Take 1 tablet by mouth daily.     lisinopril (ZESTRIL) 10 MG tablet Take 1 tablet (10 mg total) by mouth daily. 90 tablet 3   metoprolol tartrate (LOPRESSOR) 25 MG tablet Take 1 tablet (25 mg total) by mouth 2 (two) times daily. 180 tablet 3   Multiple Vitamin (DAILY MULTIVITAMIN PO) Take 1 tablet by mouth daily.     nitroGLYCERIN (NITROSTAT) 0.4 MG SL tablet Place 1 tablet (0.4 mg total) under the tongue every 5 (five) minutes as needed for chest pain. 25 tablet 0   Omega-3 Fatty Acids (FISH OIL PO) Take 1 capsule by mouth daily.     No current facility-administered medications for this visit.    REVIEW OF SYSTEMS:   Constitutional: ( - ) fevers, ( - )  chills , ( - ) night sweats Eyes: ( - ) blurriness of vision, ( - ) double vision, ( - ) watery eyes Ears, nose, mouth, throat, and face: ( - ) mucositis, ( - ) sore throat Respiratory: ( - ) cough, ( - ) dyspnea, ( - ) wheezes Cardiovascular: ( - ) palpitation, ( - ) chest discomfort, ( - ) lower extremity  swelling Gastrointestinal:  ( - ) nausea, ( - ) heartburn, ( - ) change in bowel habits Skin: ( - ) abnormal skin rashes Lymphatics: ( - ) new lymphadenopathy, ( - ) easy bruising Neurological: ( - ) numbness, ( - ) tingling, ( - ) new weaknesses Behavioral/Psych: ( - ) mood change, ( - ) new changes  All other systems were reviewed with the patient and are negative.  PHYSICAL EXAMINATION:  Vitals:   10/10/22 0831  BP: 124/80  Pulse: 66  Resp: 14  Temp: 98.2 F (36.8 C)  SpO2: 97%  Filed Weights   10/10/22 0831  Weight: 245 lb 12.8 oz (111.5 kg)     GENERAL: Well-appearing middle-aged Caucasian male, alert, no distress and comfortable SKIN: skin color, texture, turgor are normal, no rashes or significant lesions EYES: conjunctiva are pink and non-injected, sclera clear LUNGS: clear to auscultation and percussion with normal breathing effort HEART: regular rate & rhythm and no murmurs and no lower extremity edema Musculoskeletal: no cyanosis of digits and no clubbing  PSYCH: alert & oriented x 3, fluent speech NEURO: no focal motor/sensory deficits  LABORATORY DATA:  I have reviewed the data as listed    Latest Ref Rng & Units 10/10/2022    8:10 AM 07/08/2022    9:57 AM 04/07/2022    2:04 PM  CBC  WBC 4.0 - 10.5 K/uL 10.3  8.4  10.0   Hemoglobin 13.0 - 17.0 g/dL 78.2  95.6  21.3   Hematocrit 39.0 - 52.0 % 47.6  45.8  45.7   Platelets 150 - 400 K/uL 234  213  193        Latest Ref Rng & Units 10/10/2022    8:10 AM 07/08/2022    9:57 AM 04/07/2022    2:04 PM  CMP  Glucose 70 - 99 mg/dL 98  93  99   BUN 6 - 20 mg/dL 8  10  15    Creatinine 0.61 - 1.24 mg/dL 0.86  5.78  4.69   Sodium 135 - 145 mmol/L 139  139  139   Potassium 3.5 - 5.1 mmol/L 4.2  4.1  4.0   Chloride 98 - 111 mmol/L 106  107  108   CO2 22 - 32 mmol/L 26  27  26    Calcium 8.9 - 10.3 mg/dL 9.2  9.7  9.5   Total Protein 6.5 - 8.1 g/dL 6.8  6.5  6.6   Total Bilirubin 0.3 - 1.2 mg/dL 0.8  0.8  0.5    Alkaline Phos 38 - 126 U/L 104  105  84   AST 15 - 41 U/L 19  20  20    ALT 0 - 44 U/L 19  21  20     RADIOGRAPHIC STUDIES: No results found.  ASSESSMENT & PLAN Mario Underwood 62 y.o. male with medical history significant for an unprovoked pulmonary emboli and right lower extremity DVT who presents for a follow up visit.  After review of the labs, review of the records, and discussion with the patient the patients findings are most consistent with a massive unprovoked pulmonary embolism and right lower extremity DVT.   A provoked venous thromboembolism (VTE) is one that has a clear inciting factor or event. Provoking factors include prolonged travel/immobility, surgery (particular abdominal or orthropedic), trauma,  and pregnancy/ estrogen containing birth control. After a detailed history and review of the records there is no clear provoking factor for this patient's VTE.  Patients with unprovoked VTEs have up to 25% recurrence after 5 years and 36% at 10 years, with 4% of these clots being fatal (BMJ?2019;366:l4363). Therefore the formal recommendation for unprovoked VTE's is lifelong anticoagulation, as the cause may not be transient or reversible. We recommend 6 months or full strength anticoagulation with a re-evaluation after that time.  The patient's will then have a choice of maintenance dose DOAC (preferred, recommended), 81mg  ASA PO daily (non-preferred), or no further anticoagulation (not recommended).     #Unprovoked DVT/Pulmonary Embolism  --findings at this time are consistent with an unprovoked VTE  --will order  at each visit CMP and CBC to assure labs are adequate for DOAC therapy  --ruled out APS with negative anticardiolipin and anti beta2 glycoprotein antibodies.  Lupus anticoagulant panel would be altered by presence of blood thinner, will hold on this testing.   --recommend the patient continue eliquis 5mg  BID.  Due to the unprovoked nature of this VTE we will need to continue  anticoagulation indefinitely. --After the patient completes his current course of Eliquis 5 mg twice daily recommend restarting with Eliquis 2.5 mg twice daily for maintenance therapy. --patient denies any bleeding or dark stools on this medicatio, though he is having some bruising on his upper extremities.  It is well tolerated. No difficulties accessing/affording the medication  --Labs today show white blood cell 10.3, Hgb 16.3, MCV 91.7, Plt 234 --RTC in 6 months' time with strict return precautions for overt signs of bleeding.  No orders of the defined types were placed in this encounter.   All questions were answered. The patient knows to call the clinic with any problems, questions or concerns.  A total of more than 30 minutes were spent on this encounter with face-to-face time and non-face-to-face time, including preparing to see the patient, ordering tests and/or medications, counseling the patient and coordination of care as outlined above.   Mario Barns, MD Department of Hematology/Oncology [facility] at [facility] Phone: [phone] Pager: [phone] Email: [email or web address]  10/10/2022 9:04 AM

## 2022-10-10 ENCOUNTER — Inpatient Hospital Stay (HOSPITAL_BASED_OUTPATIENT_CLINIC_OR_DEPARTMENT_OTHER): Payer: Commercial Managed Care - PPO | Admitting: Hematology and Oncology

## 2022-10-10 ENCOUNTER — Ambulatory Visit: Payer: Commercial Managed Care - PPO

## 2022-10-10 ENCOUNTER — Encounter: Payer: Self-pay | Admitting: Cardiovascular Disease

## 2022-10-10 ENCOUNTER — Other Ambulatory Visit: Payer: Self-pay | Admitting: Cardiovascular Disease

## 2022-10-10 ENCOUNTER — Ambulatory Visit: Payer: Commercial Managed Care - PPO | Attending: Cardiovascular Disease | Admitting: Cardiovascular Disease

## 2022-10-10 ENCOUNTER — Other Ambulatory Visit: Payer: Self-pay

## 2022-10-10 ENCOUNTER — Inpatient Hospital Stay: Payer: Commercial Managed Care - PPO | Attending: Hematology and Oncology

## 2022-10-10 VITALS — BP 110/62 | HR 96 | Ht 71.0 in | Wt 245.6 lb

## 2022-10-10 VITALS — BP 124/80 | HR 66 | Temp 98.2°F | Resp 14 | Wt 245.8 lb

## 2022-10-10 DIAGNOSIS — I2510 Atherosclerotic heart disease of native coronary artery without angina pectoris: Secondary | ICD-10-CM

## 2022-10-10 DIAGNOSIS — E782 Mixed hyperlipidemia: Secondary | ICD-10-CM | POA: Diagnosis not present

## 2022-10-10 DIAGNOSIS — I10 Essential (primary) hypertension: Secondary | ICD-10-CM | POA: Diagnosis not present

## 2022-10-10 DIAGNOSIS — R0602 Shortness of breath: Secondary | ICD-10-CM | POA: Diagnosis not present

## 2022-10-10 DIAGNOSIS — I2699 Other pulmonary embolism without acute cor pulmonale: Secondary | ICD-10-CM | POA: Diagnosis present

## 2022-10-10 DIAGNOSIS — Z79899 Other long term (current) drug therapy: Secondary | ICD-10-CM | POA: Insufficient documentation

## 2022-10-10 DIAGNOSIS — I82401 Acute embolism and thrombosis of unspecified deep veins of right lower extremity: Secondary | ICD-10-CM | POA: Insufficient documentation

## 2022-10-10 DIAGNOSIS — I252 Old myocardial infarction: Secondary | ICD-10-CM

## 2022-10-10 DIAGNOSIS — Z7901 Long term (current) use of anticoagulants: Secondary | ICD-10-CM | POA: Diagnosis not present

## 2022-10-10 LAB — CBC WITH DIFFERENTIAL (CANCER CENTER ONLY)
Abs Immature Granulocytes: 0.05 10*3/uL (ref 0.00–0.07)
Basophils Absolute: 0.1 10*3/uL (ref 0.0–0.1)
Basophils Relative: 1 %
Eosinophils Absolute: 0.3 10*3/uL (ref 0.0–0.5)
Eosinophils Relative: 3 %
HCT: 47.6 % (ref 39.0–52.0)
Hemoglobin: 16.3 g/dL (ref 13.0–17.0)
Immature Granulocytes: 1 %
Lymphocytes Relative: 20 %
Lymphs Abs: 2 10*3/uL (ref 0.7–4.0)
MCH: 31.4 pg (ref 26.0–34.0)
MCHC: 34.2 g/dL (ref 30.0–36.0)
MCV: 91.7 fL (ref 80.0–100.0)
Monocytes Absolute: 0.9 10*3/uL (ref 0.1–1.0)
Monocytes Relative: 9 %
Neutro Abs: 7 10*3/uL (ref 1.7–7.7)
Neutrophils Relative %: 66 %
Platelet Count: 234 10*3/uL (ref 150–400)
RBC: 5.19 MIL/uL (ref 4.22–5.81)
RDW: 13.5 % (ref 11.5–15.5)
WBC Count: 10.3 10*3/uL (ref 4.0–10.5)
nRBC: 0 % (ref 0.0–0.2)

## 2022-10-10 LAB — CMP (CANCER CENTER ONLY)
ALT: 19 U/L (ref 0–44)
AST: 19 U/L (ref 15–41)
Albumin: 4.1 g/dL (ref 3.5–5.0)
Alkaline Phosphatase: 104 U/L (ref 38–126)
Anion gap: 7 (ref 5–15)
BUN: 8 mg/dL (ref 6–20)
CO2: 26 mmol/L (ref 22–32)
Calcium: 9.2 mg/dL (ref 8.9–10.3)
Chloride: 106 mmol/L (ref 98–111)
Creatinine: 0.96 mg/dL (ref 0.61–1.24)
GFR, Estimated: >60 mL/min (ref >60)
Glucose, Bld: 98 mg/dL (ref 70–99)
Potassium: 4.2 mmol/L (ref 3.5–5.1)
Sodium: 139 mmol/L (ref 135–145)
Total Bilirubin: 0.8 mg/dL (ref 0.3–1.2)
Total Protein: 6.8 g/dL (ref 6.5–8.1)

## 2022-10-10 MED ORDER — APIXABAN 2.5 MG PO TABS: 2.5000 mg | ORAL_TABLET | Freq: Two times a day (BID) | ORAL | 1 refills | Status: DC

## 2022-10-10 MED ORDER — CLOPIDOGREL BISULFATE 75 MG PO TABS: 75.0000 mg | ORAL_TABLET | Freq: Every day | ORAL | 3 refills | Status: DC

## 2022-10-10 MED ORDER — NITROGLYCERIN 0.4 MG SL SUBL: SUBLINGUAL_TABLET | SUBLINGUAL | 6 refills | Status: DC

## 2022-10-10 MED ORDER — METOPROLOL TARTRATE 25 MG PO TABS: 25.0000 mg | ORAL_TABLET | Freq: Two times a day (BID) | ORAL | 3 refills | Status: DC

## 2022-10-10 MED ORDER — ATORVASTATIN CALCIUM 80 MG PO TABS: 80.0000 mg | ORAL_TABLET | Freq: Every day | ORAL | 3 refills | Status: DC

## 2022-10-10 MED ORDER — LISINOPRIL 10 MG PO TABS: 10.0000 mg | ORAL_TABLET | Freq: Every day | ORAL | 3 refills | Status: DC

## 2022-10-10 NOTE — Progress Notes (Signed)
Cardiology Office Note:    Date:  10/10/2022   ID:  Mario Underwood, DOB [birth year], MRN [id number]  PCP:  Gennette Pac, NP   [facility] Cardiologist:  Sherren Mocha, MD Sleep Medicine:  Fransico Him, MD     Referring MD: Gennette Pac, NP   Chief Complaint  Patient presents with   Coronary Artery Disease    History of Present Illness:    Mario Underwood is a 62 y.o. male with a hx of  coronary artery disease, presenting for follow-up evaluation.  The patient initially presented in 2012 with an acute inferior wall MI treated with drug-eluting stent implantation in the right coronary artery, followed by staged PCI to proximal LAD.  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  He was noted to have very late stent thrombosis of the RCA.  He was treated with multiple overlapping drug-eluting stents.  The LAD stent was noted to be patent.  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  He was ultimately treated with apixaban at full dose, and recently his dose was reduced to 2.5 mg twice daily for long-term prevention.  The patient is here with his wife today.  He is doing pretty well.  He feels quite tired at the end of the day after his long work hours.  He has a difficult time getting any exercise because of fatigue.  He goes to work very early in the morning and is not able to exercise in the mornings with 5 AM start time at his work.  His breathing is doing okay and has improved significantly since he was treated for his pulmonary embolus.  He is had no recent chest pain or pressure.  He denies orthopnea or PND.  He denies lightheadedness or syncope.  He has had some chronic problems with his right hand with pain and swelling in the joints of the hand.    Past Medical History:  Diagnosis Date   Asthma    once.week   CAD (coronary  artery disease)    A.  Inferior STEMI 12/12/10, treated with a Xience DES;  B.  Staged PCI of the LAD with a Promus DES;  C.  EF 55%, inferior AK   Hyperlipemia    Hypertension    Myocardial infarction [facility]) 2012   2 stents placed   Osteoarthritis    Right knee TKR pending with Dr. Alvan Dame   Pneumonia     Past Surgical History:  Procedure Laterality Date   ARTHROSCOPIC REPAIR ACL Right    CORONARY STENT PLACEMENT  2012   HERNIA REPAIR     left inguinal   TOTAL KNEE ARTHROPLASTY Right 10/07/2013   Procedure: RIGHT TOTAL KNEE ARTHROPLASTY;  Surgeon: Vickey Huger, MD;  Location: [place];  Service: Orthopedics;  Laterality: Right;   TOTAL KNEE ARTHROPLASTY Left 09/25/2017   Procedure: LEFT TOTAL KNEE ARTHROPLASTY;  Surgeon: Vickey Huger, MD;  Location: [place];  Service: Orthopedics;  Laterality: Left;   urology procedure      Current Medications: Current Meds  Medication Sig   albuterol (PROVENTIL HFA;VENTOLIN HFA) 108 (90 BASE) MCG/ACT inhaler Inhale 1 puff into the lungs 2 (two) times daily as needed for wheezing.   apixaban (ELIQUIS) 2.5 MG TABS tablet Take 1 tablet (2.5 mg total) by mouth 2 (two) times daily.   Glucosamine-Chondroitin-MSM 500-200-150 MG  TABS Take 1 tablet by mouth daily.   Multiple Vitamin (DAILY MULTIVITAMIN PO) Take 1 tablet by mouth daily.   Omega-3 Fatty Acids (FISH OIL PO) Take 1 capsule by mouth daily.   [DISCONTINUED] atorvastatin (LIPITOR) 80 MG tablet Take 1 tablet (80 mg total) by mouth daily.   [DISCONTINUED] clopidogrel (PLAVIX) 75 MG tablet Take 1 tablet (75 mg total) by mouth daily.   [DISCONTINUED] lisinopril (ZESTRIL) 10 MG tablet Take 1 tablet (10 mg total) by mouth daily.   [DISCONTINUED] metoprolol tartrate (LOPRESSOR) 25 MG tablet Take 1 tablet (25 mg total) by mouth 2 (two) times daily.   [DISCONTINUED] nitroGLYCERIN (NITROSTAT) 0.4 MG SL tablet Place 1 tablet (0.4 mg total) under the tongue every 5 (five) minutes as needed for chest pain.      Allergies:   Penicillins   Social History   Socioeconomic History   Marital status: Married    Spouse name: Not on file   Number of children: Not on file   Years of education: Not on file   Highest education level: Not on file  Occupational History   Not on file  Tobacco Use   Smoking status: Never   Smokeless tobacco: Current    Types: Chew  Vaping Use   Vaping Use: Never used  Substance and Sexual Activity   Alcohol use: Yes    Comment: weekends   Drug use: No   Sexual activity: Yes  Other Topics Concern   Not on file  Social History Narrative   Not on file   Social Determinants of Health   Financial Resource Strain: Not on file  Food Insecurity: Not on file  Transportation Needs: Not on file  Physical Activity: Not on file  Stress: Not on file  Social Connections: Not on file     Family History: The patient's family history includes Coronary artery disease in his sister; Heart disease in his mother.  ROS:   Please see the history of present illness.    All other systems reviewed and are negative.  EKGs/Labs/Other Studies Reviewed:    The following studies were reviewed today: Cardiac Studies & Procedures       ECHOCARDIOGRAM  ECHOCARDIOGRAM COMPLETE 02/21/2022  Narrative ECHOCARDIOGRAM REPORT    Patient Name:   Mario Underwood Date of Exam: 02/21/2022 Medical Rec #:  [id number]       Height:       72.0 in Accession #:    [id number]      Weight:       240.2 lb Date of Birth:  20-Jul-1961        BSA:          2.303 m Patient Age:    31 years        BP:           134/87 mmHg Patient Gender: M               HR:           66 bpm. Exam Location:  Inpatient  Procedure: 2D Echo, Cardiac Doppler and Color Doppler  Indications:    Dyspnea  History:        Patient has no prior history of Echocardiogram examinations. CAD and Previous Myocardial Infarction, Signs/Symptoms:Shortness of Breath; Risk Factors:Hypertension and Dyslipidemia.  DOE.  Sonographer:    Clayton Lefort RDCS (AE) Referring Phys: [id number] [place]   1. Left ventricular ejection fraction, by estimation, is 45 to 50%. The left ventricle  has mildly decreased function. The left ventricle demonstrates regional wall motion abnormalities (see scoring diagram/findings for description). There is moderate left ventricular hypertrophy. Left ventricular diastolic parameters are consistent with Grade II diastolic dysfunction (pseudonormalization). 2. Right ventricular systolic function is normal. The right ventricular size is normal. 3. Left atrial size was moderately dilated. 4. The mitral valve is normal in structure. Trivial mitral valve regurgitation. No evidence of mitral stenosis. 5. The aortic valve is normal in structure. Aortic valve regurgitation is not visualized. No aortic stenosis is present. 6. The inferior vena cava is normal in size with greater than 50% respiratory variability, suggesting right atrial pressure of 3 mmHg.  FINDINGS Left Ventricle: Left ventricular ejection fraction, by estimation, is 45 to 50%. The left ventricle has mildly decreased function. The left ventricle demonstrates regional wall motion abnormalities. The left ventricular internal cavity size was normal in size. There is moderate left ventricular hypertrophy. Left ventricular diastolic parameters are consistent with Grade II diastolic dysfunction (pseudonormalization).   LV Wall Scoring: The basal anteroseptal segment is dyskinetic.  Right Ventricle: The right ventricular size is normal. No increase in right ventricular wall thickness. Right ventricular systolic function is normal.  Left Atrium: Left atrial size was moderately dilated.  Right Atrium: Right atrial size was normal in size.  Pericardium: There is no evidence of pericardial effusion.  Mitral Valve: The mitral valve is normal in structure. Trivial mitral valve regurgitation. No evidence of  mitral valve stenosis.  Tricuspid Valve: The tricuspid valve is normal in structure. Tricuspid valve regurgitation is not demonstrated. No evidence of tricuspid stenosis.  Aortic Valve: The aortic valve is normal in structure. Aortic valve regurgitation is not visualized. No aortic stenosis is present. Aortic valve mean gradient measures 2.0 mmHg. Aortic valve peak gradient measures 3.7 mmHg. Aortic valve area, by VTI measures 4.79 cm.  Pulmonic Valve: The pulmonic valve was normal in structure. Pulmonic valve regurgitation is not visualized. No evidence of pulmonic stenosis.  Aorta: The aortic root is normal in size and structure.  Venous: The inferior vena cava is normal in size with greater than 50% respiratory variability, suggesting right atrial pressure of 3 mmHg.  IAS/Shunts: No atrial level shunt detected by color flow Doppler.   LEFT VENTRICLE PLAX 2D LVIDd:         2.70 cm      Diastology LVIDs:         2.00 cm      LV e' medial:    8.16 cm/s LV PW:         1.50 cm      LV E/e' medial:  9.7 LV IVS:        1.50 cm      LV e' lateral:   17.80 cm/s LVOT diam:     2.50 cm      LV E/e' lateral: 4.5 LV SV:         98 LV SV Index:   43 LVOT Area:     4.91 cm  LV Volumes (MOD) LV vol d, MOD A2C: 136.0 ml LV vol d, MOD A4C: 155.0 ml LV vol s, MOD A2C: 58.6 ml LV vol s, MOD A4C: 69.2 ml LV SV MOD A2C:     77.4 ml LV SV MOD A4C:     155.0 ml LV SV MOD BP:      78.6 ml  RIGHT VENTRICLE             IVC RV Basal diam:  2.50  cm     IVC diam: 1.30 cm RV S prime:     15.60 cm/s TAPSE (M-mode): 2.1 cm  LEFT ATRIUM             Index        RIGHT ATRIUM           Index LA diam:        4.40 cm 1.91 cm/m   RA Area:     14.40 cm LA Vol (A2C):   82.3 ml 35.74 ml/m  RA Volume:   28.50 ml  12.38 ml/m LA Vol (A4C):   96.9 ml 42.08 ml/m LA Biplane Vol: 93.4 ml 40.56 ml/m AORTIC VALVE AV Area (Vmax):    5.03 cm AV Area (Vmean):   4.64 cm AV Area (VTI):     4.79 cm AV Vmax:            96.20 cm/s AV Vmean:          62.900 cm/s AV VTI:            0.205 m AV Peak Grad:      3.7 mmHg AV Mean Grad:      2.0 mmHg LVOT Vmax:         98.50 cm/s LVOT Vmean:        59.500 cm/s LVOT VTI:          0.200 m LVOT/AV VTI ratio: 0.98  AORTA Ao Root diam: 3.80 cm Ao Asc diam:  3.90 cm  MITRAL VALVE MV Area (PHT): 2.90 cm    SHUNTS MV Decel Time: 262 msec    Systemic VTI:  0.20 m MV E velocity: 79.50 cm/s  Systemic Diam: 2.50 cm MV A velocity: 59.90 cm/s MV E/A ratio:  1.33  Candee Furbish MD Electronically signed by Candee Furbish MD Signature Date/Time: 02/21/2022/4:31:30 PM    Final              EKG:  EKG is not ordered today.   Recent Labs: 02/21/2022: B Natriuretic Peptide 27.8 10/10/2022: ALT 19; BUN 8; Creatinine 0.96; Hemoglobin 16.3; Platelet Count 234; Potassium 4.2; Sodium 139  Recent Lipid Panel    Component Value Date/Time   CHOL 152 10/12/2021 1440   TRIG 191 (H) 10/12/2021 1440   HDL 45 10/12/2021 1440   CHOLHDL 3.4 10/12/2021 1440   CHOLHDL 3.4 02/19/2016 1021   VLDL 30 02/19/2016 1021   LDLCALC 75 10/12/2021 1440   LDLDIRECT  12/22/2010 0345    82 (NOTE) ATP III Classification (LDL):      < 100        mg/dL         Optimal     100 - 129     mg/dL         Near or Above Optimal     130 - 159     mg/dL         Borderline High     160 - 189     mg/dL         High      > 190        mg/dL         Very  High      Risk Assessment/Calculations:           STOP-Bang Score:  5       Physical Exam:    VS:  BP 110/62   Pulse 96   Ht 5\' 11"  (1.803 m)  Wt 245 lb 9.6 oz (111.4 kg)   SpO2 95%   BMI 34.25 kg/m     Wt Readings from Last 3 Encounters:  10/10/22 245 lb 9.6 oz (111.4 kg)  10/10/22 245 lb 12.8 oz (111.5 kg)  07/08/22 235 lb (106.6 kg)     GEN:  Well nourished, well developed in no acute distress HEENT: Normal NECK: No JVD; No carotid bruits LYMPHATICS: No lymphadenopathy CARDIAC: RRR, no murmurs, rubs, gallops RESPIRATORY:   Clear to auscultation without rales, wheezing or rhonchi  ABDOMEN: Soft, non-tender, non-distended MUSCULOSKELETAL: No deformity.  The right hand has mild swelling and tenderness to palpation over the dorsum of the hand. SKIN: Warm and dry NEUROLOGIC:  Alert and oriented x 3 PSYCHIATRIC:  Normal affect   ASSESSMENT:    1. Coronary artery disease involving native coronary artery of native heart without angina pectoris   2. History of acute inferior wall MI   3. Essential hypertension   4. Mixed hyperlipidemia    PLAN:    In order of problems listed above:  Currently stable without anginal symptoms.  He remains on clopidogrel 75 mg daily, atorvastatin 80 mg daily, and metoprolol.  Continue current management.  With recurrent myocardial infarction, I plan to keep him on long-term clopidogrel if tolerated. As above, stable on medical therapy.  LVEF is 45%.  Continue lisinopril and metoprolol.  No active signs of heart failure or volume excess. Blood pressure is well-controlled on current medications. Continue atorvastatin 80 mg daily.  Lipids 1 year ago with an LDL of 75.  Labs were drawn today.  Will push his LDL below 70 as tolerated and will aim for a goal LDL less than 55 if we can achieve that.  Lengthy discussion today regarding diet and lifestyle modification.  His work situation is difficult for him to maintain.  He has had 2 myocardial infarctions and a very serious pulmonary embolism.  He is considering disability and will be in touch if he needs further documentation of his cardiac status.     Medication Adjustments/Labs and Tests Ordered: Current medicines are reviewed at length with the patient today.  Concerns regarding medicines are outlined above.  No orders of the defined types were placed in this encounter.  Meds ordered this encounter  Medications   atorvastatin (LIPITOR) 80 MG tablet    Sig: Take 1 tablet (80 mg total) by mouth daily.    Dispense:  90 tablet     Refill:  3   clopidogrel (PLAVIX) 75 MG tablet    Sig: Take 1 tablet (75 mg total) by mouth daily.    Dispense:  90 tablet    Refill:  3   lisinopril (ZESTRIL) 10 MG tablet    Sig: Take 1 tablet (10 mg total) by mouth daily.    Dispense:  90 tablet    Refill:  3   metoprolol tartrate (LOPRESSOR) 25 MG tablet    Sig: Take 1 tablet (25 mg total) by mouth 2 (two) times daily.    Dispense:  180 tablet    Refill:  3   nitroGLYCERIN (NITROSTAT) 0.4 MG SL tablet    Sig: Dissolve 1 tablet under the tongue every 5 minutes as needed for chest pain. Max of 3 doses, then 911.    Dispense:  25 tablet    Refill:  6    Patient Instructions  Medication Instructions:  Your physician recommends that you continue on your current medications as directed. Please refer to the  Current Medication list given to you today.  *If you need a refill on your cardiac medications before your next appointment, please call your pharmacy*   Lab Work: NONE If you have labs (blood work) drawn today and your tests are completely normal, you will receive your results only by: [place] (if you have MyChart) OR A paper copy in the mail If you have any lab test that is abnormal or we need to change your treatment, we will call you to review the results.   Testing/Procedures: NONE   Follow-Up: At [facility], you and your health needs are our priority.  As part of our continuing mission to provide you with exceptional heart care, we have created designated Provider Care Teams.  These Care Teams include your primary Cardiologist (physician) and Advanced Practice Providers (APPs -  Physician Assistants and Nurse Practitioners) who all work together to provide you with the care you need, when you need it.  We recommend signing up for the patient portal called "MyChart".  Sign up information is provided on this After Visit Summary.  MyChart is used to connect with patients for Virtual Visits  (Telemedicine).  Patients are able to view lab/test results, encounter notes, upcoming appointments, etc.  Non-urgent messages can be sent to your provider as well.   To learn more about what you can do with MyChart, go to [email or web address].    Your next appointment:   1 year(s)  Provider:   Sherren Mocha, MD        Signed, Sherren Mocha, MD  10/10/2022 6:02 PM    [place]

## 2022-10-10 NOTE — Progress Notes (Signed)
Patient has yearly appt today with Dr Burt Knack and wanted labs to be done this morning prior to appt. Order placed for CBC, CMET, lipids.

## 2022-10-10 NOTE — Patient Instructions (Signed)
Medication Instructions:  Your physician recommends that you continue on your current medications as directed. Please refer to the Current Medication list given to you today.  *If you need a refill on your cardiac medications before your next appointment, please call your pharmacy*   Lab Work: NONE If you have labs (blood work) drawn today and your tests are completely normal, you will receive your results only by: MyChart Message (if you have MyChart) OR A paper copy in the mail If you have any lab test that is abnormal or we need to change your treatment, we will call you to review the results.   Testing/Procedures: NONE   Follow-Up: At [facility], you and your health needs are our priority.  As part of our continuing mission to provide you with exceptional heart care, we have created designated Provider Care Teams.  These Care Teams include your primary Cardiologist (physician) and Advanced Practice Providers (APPs -  Physician Assistants and Nurse Practitioners) who all work together to provide you with the care you need, when you need it.  We recommend signing up for the patient portal called "MyChart".  Sign up information is provided on this After Visit Summary.  MyChart is used to connect with patients for Virtual Visits (Telemedicine).  Patients are able to view lab/test results, encounter notes, upcoming appointments, etc.  Non-urgent messages can be sent to your provider as well.   To learn more about what you can do with MyChart, go to [email or web address].    Your next appointment:   1 year(s)  Provider:   Michael Cooper, MD      

## 2022-10-11 LAB — LIPID PANEL
Chol/HDL Ratio: 3.4 ratio (ref 0.0–5.0)
Cholesterol, Total: 124 mg/dL (ref 100–199)
HDL: 36 mg/dL — ABNORMAL LOW (ref >39)
LDL Chol Calc (NIH): 58 mg/dL (ref 0–99)
Triglycerides: 182 mg/dL — ABNORMAL HIGH (ref 0–149)
VLDL Cholesterol Cal: 30 mg/dL (ref 5–40)

## 2022-10-21 ENCOUNTER — Ambulatory Visit: Payer: Commercial Managed Care - PPO | Admitting: Cardiology

## 2022-11-14 ENCOUNTER — Ambulatory Visit: Payer: Commercial Managed Care - PPO | Attending: Cardiology | Admitting: Cardiology

## 2022-11-14 ENCOUNTER — Telehealth: Payer: Self-pay | Admitting: *Deleted

## 2022-11-14 ENCOUNTER — Encounter: Payer: Self-pay | Admitting: Cardiology

## 2022-11-14 VITALS — BP 118/74 | HR 76 | Ht 72.0 in | Wt 248.6 lb

## 2022-11-14 DIAGNOSIS — G4719 Other hypersomnia: Secondary | ICD-10-CM

## 2022-11-14 DIAGNOSIS — I10 Essential (primary) hypertension: Secondary | ICD-10-CM

## 2022-11-14 DIAGNOSIS — G4733 Obstructive sleep apnea (adult) (pediatric): Secondary | ICD-10-CM | POA: Diagnosis not present

## 2022-11-14 DIAGNOSIS — I2510 Atherosclerotic heart disease of native coronary artery without angina pectoris: Secondary | ICD-10-CM

## 2022-11-14 NOTE — Telephone Encounter (Signed)
Order placed to [employer] [facility] via community message. 

## 2022-11-14 NOTE — Progress Notes (Signed)
SLEEP MEDICINE Note   Date:  11/14/2022   ID:  Mario Underwood, DOB [birth year], MRN [id number] The patient was identified using 2 identifiers.  PCP:  Marcell Anger, NP   [facility] Providers Cardiologist:  Tonny Bollman, MD Sleep Medicine:  Armanda Magic, MD     Chief Complaint:  OSA  History of Present Illness:    Mario Underwood is a 62 y.o. male with a hx of ASCAD, HLD, HTN and asthma who was seen by Dr. Excell Seltzer in March 2023 and complained of sleeping poorly and excessive daytime sleepiness.  He ordered a HST which showed mild OSA with an AHI of 6.1/hr and O2 sats as low as 83%.   At last office visit we reviewed the findings of his sleep study and recommended that he start on auto CPAP.  He is now back for follow-up.    He is doing well with his PAP device and thinks that he has gotten used to it.  He tolerates the full face mask and feels the pressure is adequate.  Since going on PAP he feels rested in the am and has no significant daytime sleepiness.  He denies any significant mouth or nasal dryness or nasal congestion.  He does not think that he snores.    Past Medical History:  Diagnosis Date   Asthma    once.week   CAD (coronary artery disease)    A.  Inferior STEMI 12/12/10, treated with a Xience DES;  B.  Staged PCI of the LAD with a Promus DES;  C.  EF 55%, inferior AK   Hyperlipemia    Hypertension    Myocardial infarction 2012   2 stents placed   OSA on CPAP    mild OSA with an AHI of 6.1/hr   Osteoarthritis    Right knee TKR pending with Dr. Charlann Boxer   Pneumonia    Past Surgical History:  Procedure Laterality Date   ARTHROSCOPIC REPAIR ACL Right    CORONARY STENT PLACEMENT  2012   HERNIA REPAIR     left inguinal   TOTAL KNEE ARTHROPLASTY Right 10/07/2013   Procedure: RIGHT TOTAL KNEE ARTHROPLASTY;  Surgeon: Dannielle Huh, MD;  Location: [facility];  Service: Orthopedics;  Laterality: Right;   TOTAL KNEE ARTHROPLASTY Left 09/25/2017   Procedure: LEFT  TOTAL KNEE ARTHROPLASTY;  Surgeon: Dannielle Huh, MD;  Location: [facility];  Service: Orthopedics;  Laterality: Left;   urology procedure       Current Meds  Medication Sig   albuterol (PROVENTIL HFA;VENTOLIN HFA) 108 (90 BASE) MCG/ACT inhaler Inhale 1 puff into the lungs 2 (two) times daily as needed for wheezing.   apixaban (ELIQUIS) 2.5 MG TABS tablet Take 1 tablet (2.5 mg total) by mouth 2 (two) times daily.   atorvastatin (LIPITOR) 80 MG tablet Take 1 tablet (80 mg total) by mouth daily.   clopidogrel (PLAVIX) 75 MG tablet Take 1 tablet (75 mg total) by mouth daily.   Glucosamine-Chondroitin-MSM 500-200-150 MG TABS Take 1 tablet by mouth daily.   lisinopril (ZESTRIL) 10 MG tablet Take 1 tablet (10 mg total) by mouth daily.   metoprolol tartrate (LOPRESSOR) 25 MG tablet Take 1 tablet (25 mg total) by mouth 2 (two) times daily.   Multiple Vitamin (DAILY MULTIVITAMIN PO) Take 1 tablet by mouth daily.   nitroGLYCERIN (NITROSTAT) 0.4 MG SL tablet Dissolve 1 tablet under the tongue every 5 minutes as needed for chest pain. Max of 3 doses,  then 911.   Omega-3 Fatty Acids (FISH OIL PO) Take 1 capsule by mouth daily.     Allergies:   Penicillins   Social History   Tobacco Use   Smoking status: Never   Smokeless tobacco: Current    Types: Chew  Vaping Use   Vaping Use: Never used  Substance Use Topics   Alcohol use: Yes    Comment: weekends   Drug use: No     Family Hx: The patient's family history includes Coronary artery disease in his sister; Heart disease in his mother.  ROS:   Please see the history of present illness.     All other systems reviewed and are negative.   Prior Sleep studies:   The following studies were reviewed today:  HST 10/2021 FINDINGS: 1. Mild Obstructive Sleep Apnea with AHI 6.1/hr. 2. No Central apneas were present. 3. Lowest oxygen saturation was 83%. 4. Mild snoring was present. O2 sats were < 88% for 1.2 min. 5. Total sleep time was 6hrs and 16  min. 6. 22.6% of total sleep time was spent in REM sleep. 7. Shortened sleep onset latency at 5 min. 8. Shortened REM sleep onset latency at 56 min. 9. Total awakenings were 11.   DIAGNOSIS: Mild Obstructive Sleep Apnea (G47.33)    Labs/Other Tests and Data Reviewed:     Recent Labs: 02/21/2022: B Natriuretic Peptide 27.8 10/10/2022: ALT 19; BUN 8; Creatinine 0.96; Hemoglobin 16.3; Platelet Count 234; Potassium 4.2; Sodium 139    Wt Readings from Last 3 Encounters:  11/14/22 248 lb 9.6 oz (112.8 kg)  10/10/22 245 lb 9.6 oz (111.4 kg)  10/10/22 245 lb 12.8 oz (111.5 kg)     Risk Assessment/Calculations:      STOP-Bang Score:         Objective:    Vital Signs:  BP 118/74   Pulse 76   Ht 6' (1.829 m)   Wt 248 lb 9.6 oz (112.8 kg)   SpO2 98%   BMI 33.72 kg/m    VITAL SIGNS:  reviewed GEN:  no acute distress EYES:  sclerae anicteric, EOMI - Extraocular Movements Intact RESPIRATORY:  normal respiratory effort, symmetric expansion CARDIOVASCULAR:  no peripheral edema SKIN:  no rash, lesions or ulcers. MUSCULOSKELETAL:  no obvious deformities. NEURO:  alert and oriented x 3, no obvious focal deficit PSYCH:  normal affect  ASSESSMENT & PLAN:    OSA - The patient is tolerating PAP therapy well without any problems. The PAP download performed by his DME was personally reviewed and interpreted by me today and showed an AHI of 0.9 /hr on auto CPAP from 4-10 cm H2O with 96% compliance in using more than 4 hours nightly.  The patient has been using and benefiting from PAP use and will continue to benefit from therapy.  -he would like to be set up on a set pressure of 10cm H2O he feels he is getting the same pressure throughout the night. -get a download in 4 weeks on set pressure  HTN -BP is adequately controlled on exam today -Continue prescription drug management with lisinopril 10 mg daily and Lopressor 25 mg twice daily with as needed refills   Time:   Today, I have  spent 15 minutes with the patient with telehealth technology discussing the above problems.     Medication Adjustments/Labs and Tests Ordered: Current medicines are reviewed at length with the patient today.  Concerns regarding medicines are outlined above.   Tests Ordered: No orders of  the defined types were placed in this encounter.   Medication Changes: No orders of the defined types were placed in this encounter.   Follow Up:  In Person in 1 year(s)  Signed, Armanda Magic, MD  11/14/2022 1:28 PM     [facility]

## 2022-11-14 NOTE — Patient Instructions (Signed)
Medication Instructions:  Your physician recommends that you continue on your current medications as directed. Please refer to the Current Medication list given to you today.  *If you need a refill on your cardiac medications before your next appointment, please call your pharmacy*   Lab Work: None.  If you have labs (blood work) drawn today and your tests are completely normal, you will receive your results only by: MyChart Message (if you have MyChart) OR A paper copy in the mail If you have any lab test that is abnormal or we need to change your treatment, we will call you to review the results.   Testing/Procedures: None.   Follow-Up:  Your next appointment:   1 year(s)  Provider:   Dr. Armanda Magic, MD   Other Instructions Dr. Mayford Knife has placed orders to change your settings on your cpap and to get a download from your machine in 4 weeks. A member from our sleep team or from your DME company may contact you about this.

## 2022-11-14 NOTE — Telephone Encounter (Signed)
-----   Message from Luellen Pucker, RN sent at 11/14/2022  1:38 PM EDT ----- Regarding: Cpap settings change Per Dr. Mayford Knife, Please change settings from Auto to CPAP at 10 cm H20. She also needs a download in 4 weeks.  Thanks! Alcario Drought

## 2023-04-11 ENCOUNTER — Other Ambulatory Visit: Payer: Self-pay | Admitting: Physician Assistant

## 2023-04-11 DIAGNOSIS — Z86711 Personal history of pulmonary embolism: Secondary | ICD-10-CM

## 2023-04-12 ENCOUNTER — Inpatient Hospital Stay (HOSPITAL_BASED_OUTPATIENT_CLINIC_OR_DEPARTMENT_OTHER): Payer: Commercial Managed Care - PPO | Admitting: Physician Assistant

## 2023-04-12 ENCOUNTER — Inpatient Hospital Stay: Payer: Commercial Managed Care - PPO | Attending: Physician Assistant

## 2023-04-12 VITALS — BP 103/83 | HR 75 | Temp 97.3°F | Resp 20 | Wt 226.6 lb

## 2023-04-12 DIAGNOSIS — Z79899 Other long term (current) drug therapy: Secondary | ICD-10-CM | POA: Diagnosis not present

## 2023-04-12 DIAGNOSIS — Z86711 Personal history of pulmonary embolism: Secondary | ICD-10-CM | POA: Diagnosis present

## 2023-04-12 DIAGNOSIS — Z86718 Personal history of other venous thrombosis and embolism: Secondary | ICD-10-CM | POA: Diagnosis present

## 2023-04-12 DIAGNOSIS — Z7901 Long term (current) use of anticoagulants: Secondary | ICD-10-CM | POA: Diagnosis not present

## 2023-04-12 LAB — CBC WITH DIFFERENTIAL (CANCER CENTER ONLY)
Abs Immature Granulocytes: 0.02 10*3/uL (ref 0.00–0.07)
Basophils Absolute: 0.1 10*3/uL (ref 0.0–0.1)
Basophils Relative: 1 %
Eosinophils Absolute: 0.4 10*3/uL (ref 0.0–0.5)
Eosinophils Relative: 5 %
HCT: 47.9 % (ref 39.0–52.0)
Hemoglobin: 15.9 g/dL (ref 13.0–17.0)
Immature Granulocytes: 0 %
Lymphocytes Relative: 27 %
Lymphs Abs: 2.1 10*3/uL (ref 0.7–4.0)
MCH: 31.1 pg (ref 26.0–34.0)
MCHC: 33.2 g/dL (ref 30.0–36.0)
MCV: 93.6 fL (ref 80.0–100.0)
Monocytes Absolute: 0.8 10*3/uL (ref 0.1–1.0)
Monocytes Relative: 10 %
Neutro Abs: 4.6 10*3/uL (ref 1.7–7.7)
Neutrophils Relative %: 57 %
Platelet Count: 218 10*3/uL (ref 150–400)
RBC: 5.12 MIL/uL (ref 4.22–5.81)
RDW: 13.7 % (ref 11.5–15.5)
WBC Count: 7.9 10*3/uL (ref 4.0–10.5)
nRBC: 0 % (ref 0.0–0.2)

## 2023-04-12 LAB — CMP (CANCER CENTER ONLY)
ALT: 24 U/L (ref 0–44)
AST: 20 U/L (ref 15–41)
Albumin: 4.1 g/dL (ref 3.5–5.0)
Alkaline Phosphatase: 105 U/L (ref 38–126)
Anion gap: 4 — ABNORMAL LOW (ref 5–15)
BUN: 13 mg/dL (ref 6–20)
CO2: 29 mmol/L (ref 22–32)
Calcium: 9.4 mg/dL (ref 8.9–10.3)
Chloride: 105 mmol/L (ref 98–111)
Creatinine: 1.14 mg/dL (ref 0.61–1.24)
GFR, Estimated: >60 mL/min (ref >60)
Glucose, Bld: 99 mg/dL (ref 70–99)
Potassium: 4.4 mmol/L (ref 3.5–5.1)
Sodium: 138 mmol/L (ref 135–145)
Total Bilirubin: 0.7 mg/dL (ref 0.3–1.2)
Total Protein: 6.7 g/dL (ref 6.5–8.1)

## 2023-04-12 MED ORDER — APIXABAN 2.5 MG PO TABS: 2.5000 mg | ORAL_TABLET | Freq: Two times a day (BID) | ORAL | 1 refills | Status: DC

## 2023-04-12 NOTE — Progress Notes (Signed)
[facility] [phone]   [phone]  PROGRESS NOTE  Patient Care Team: Marcell Anger, NP as PCP - General (Internal Medicine) Tonny Bollman, MD as PCP - Cardiology (Cardiology) Quintella Reichert, MD as PCP - Sleep Medicine (Cardiology)  Hematological/Oncological History # Unprovoked Pulmonary Emboli  # Right Lower Extremity DVT 02/21/2022: CT angio showed pulmonary emboli as proximally as the distal right main pulmonary artery, with thrombus seen in the right upper and lower lobar and segmental arteries, which is occlusive in the right lower lobe segmental arteries.  02/22/2022: LE US showed acute deep vein thrombosis involving the right popliteal vein, and right peroneal veins. 04/07/2022: establish care with Dr. Leonides Schanz   Interval History:  [facility] 62 y.o. male with medical history significant for an unprovoked pulmonary emboli and right lower extremity DVT who presents for a follow up visit. The patient's last visit was on 10/10/2022. In the interim since the last visit he has continued faithfully on Eliquis anticoagulation.  On exam today [facility] is unaccompanied for this visit. [facility] reports he is doing well and tolerating Eliquis therapy without any toxicities. He denies easy bruising or overt signs of bleeding. He is otherwise feeling well with stable energy and appetite. He denies nausea, vomiting or bowel habit changes. He denies any signs of VTE including peripheral edema, shortness of breath or chest pain. He has no other complaints. Rest of the ROS is below.   MEDICAL HISTORY:  Past Medical History:  Diagnosis Date   Asthma    once.week   CAD (coronary artery disease)    A.  Inferior STEMI 12/12/10, treated with a Xience DES;  B.  Staged PCI of the LAD with a Promus DES;  C.  EF 55%, inferior AK   Hyperlipemia    Hypertension    Myocardial infarction (HCC) 2012   2 stents placed   OSA on CPAP    mild OSA with an AHI of  6.1/hr   Osteoarthritis    Right knee TKR pending with Dr. Charlann Boxer   Pneumonia     SURGICAL HISTORY: Past Surgical History:  Procedure Laterality Date   ARTHROSCOPIC REPAIR ACL Right    CORONARY STENT PLACEMENT  2012   HERNIA REPAIR     left inguinal   TOTAL KNEE ARTHROPLASTY Right 10/07/2013   Procedure: RIGHT TOTAL KNEE ARTHROPLASTY;  Surgeon: Dannielle Huh, MD;  Location: [facility] OR;  Service: Orthopedics;  Laterality: Right;   TOTAL KNEE ARTHROPLASTY Left 09/25/2017   Procedure: LEFT TOTAL KNEE ARTHROPLASTY;  Surgeon: Dannielle Huh, MD;  Location: [facility] OR;  Service: Orthopedics;  Laterality: Left;   urology procedure      SOCIAL HISTORY: Social History   Socioeconomic History   Marital status: Married    Spouse name: Not on file   Number of children: Not on file   Years of education: Not on file   Highest education level: Not on file  Occupational History   Not on file  Tobacco Use   Smoking status: Never   Smokeless tobacco: Current    Types: Chew  Vaping Use   Vaping status: Never Used  Substance and Sexual Activity   Alcohol use: Yes    Comment: weekends   Drug use: No   Sexual activity: Yes  Other Topics Concern   Not on file  Social History Narrative   Not on file   Social Determinants of Health   Financial Resource Strain: Not on file  Food Insecurity: Not on file  Transportation Needs: No Transportation Needs (01/19/2023)   [facility]   [facility] - Transportation    Lack of Transportation (Medical): No    Lack of Transportation (Non-Medical): No  Physical Activity: Not on file  Stress: Not on file  Social Connections: Not on file  Intimate Partner Violence: Not on file    FAMILY HISTORY: Family History  Problem Relation Age of Onset   Coronary artery disease Sister    Heart disease Mother     ALLERGIES:  is allergic to penicillins.  MEDICATIONS:  Current Outpatient Medications  Medication Sig Dispense Refill   albuterol (PROVENTIL  HFA;VENTOLIN HFA) 108 (90 BASE) MCG/ACT inhaler Inhale 1 puff into the lungs 2 (two) times daily as needed for wheezing. 1 Inhaler 2   apixaban (ELIQUIS) 2.5 MG TABS tablet Take 1 tablet (2.5 mg total) by mouth 2 (two) times daily. 180 tablet 1   atorvastatin (LIPITOR) 80 MG tablet Take 1 tablet (80 mg total) by mouth daily. 90 tablet 3   clopidogrel (PLAVIX) 75 MG tablet Take 1 tablet (75 mg total) by mouth daily. 90 tablet 3   Glucosamine-Chondroitin-MSM 500-200-150 MG TABS Take 1 tablet by mouth daily.     lisinopril (ZESTRIL) 10 MG tablet Take 1 tablet (10 mg total) by mouth daily. 90 tablet 3   metoprolol tartrate (LOPRESSOR) 25 MG tablet Take 1 tablet (25 mg total) by mouth 2 (two) times daily. 180 tablet 3   Multiple Vitamin (DAILY MULTIVITAMIN PO) Take 1 tablet by mouth daily.     nitroGLYCERIN (NITROSTAT) 0.4 MG SL tablet Dissolve 1 tablet under the tongue every 5 minutes as needed for chest pain. Max of 3 doses, then 911. 25 tablet 6   Omega-3 Fatty Acids (FISH OIL PO) Take 1 capsule by mouth daily.     No current facility-administered medications for this visit.    REVIEW OF SYSTEMS:   Constitutional: ( - ) fevers, ( - )  chills , ( - ) night sweats Eyes: ( - ) blurriness of vision, ( - ) double vision, ( - ) watery eyes Ears, nose, mouth, throat, and face: ( - ) mucositis, ( - ) sore throat Respiratory: ( - ) cough, ( - ) dyspnea, ( - ) wheezes Cardiovascular: ( - ) palpitation, ( - ) chest discomfort, ( - ) lower extremity swelling Gastrointestinal:  ( - ) nausea, ( - ) heartburn, ( - ) change in bowel habits Skin: ( - ) abnormal skin rashes Lymphatics: ( - ) new lymphadenopathy, ( - ) easy bruising Neurological: ( - ) numbness, ( - ) tingling, ( - ) new weaknesses Behavioral/Psych: ( - ) mood change, ( - ) new changes  All other systems were reviewed with the patient and are negative.  PHYSICAL EXAMINATION:  Vitals:   04/12/23 0823  BP: 103/83  Pulse: 75  Resp: 20   Temp: (!) 97.3 F (36.3 C)  SpO2: 99%    Filed Weights   04/12/23 0823  Weight: 226 lb 9.6 oz (102.8 kg)     GENERAL: Well-appearing middle-aged Caucasian male, alert, no distress and comfortable SKIN: skin color, texture, turgor are normal, no rashes or significant lesions EYES: conjunctiva are pink and non-injected, sclera clear LUNGS: clear to auscultation and percussion with normal breathing effort HEART: regular rate & rhythm and no murmurs and no lower extremity edema Musculoskeletal: no cyanosis of digits and no clubbing  PSYCH: alert & oriented  x 3, fluent speech NEURO: no focal motor/sensory deficits  LABORATORY DATA:  I have reviewed the data as listed    Latest Ref Rng & Units 04/12/2023    7:55 AM 10/10/2022    8:10 AM 07/08/2022    9:57 AM  CBC  WBC 4.0 - 10.5 K/uL 7.9  10.3  8.4   Hemoglobin 13.0 - 17.0 g/dL 38.1  82.9  93.7   Hematocrit 39.0 - 52.0 % 47.9  47.6  45.8   Platelets 150 - 400 K/uL 218  234  213        Latest Ref Rng & Units 10/10/2022    8:10 AM 07/08/2022    9:57 AM 04/07/2022    2:04 PM  CMP  Glucose 70 - 99 mg/dL 98  93  99   BUN 6 - 20 mg/dL 8  10  15    Creatinine 0.61 - 1.24 mg/dL 1.69  6.78  9.38   Sodium 135 - 145 mmol/L 139  139  139   Potassium 3.5 - 5.1 mmol/L 4.2  4.1  4.0   Chloride 98 - 111 mmol/L 106  107  108   CO2 22 - 32 mmol/L 26  27  26    Calcium 8.9 - 10.3 mg/dL 9.2  9.7  9.5   Total Protein 6.5 - 8.1 g/dL 6.8  6.5  6.6   Total Bilirubin 0.3 - 1.2 mg/dL 0.8  0.8  0.5   Alkaline Phos 38 - 126 U/L 104  105  84   AST 15 - 41 U/L 19  20  20    ALT 0 - 44 U/L 19  21  20     RADIOGRAPHIC STUDIES: No results found.  ASSESSMENT & PLAN KEATIN SHOREY is a 62 y.o. male with medical history significant for an unprovoked pulmonary emboli and right lower extremity DVT who presents for a follow up visit.  After review of the labs, review of the records, and discussion with the patient the patients findings are most consistent  with a massive unprovoked pulmonary embolism and right lower extremity DVT.  #Unprovoked DVT/Pulmonary Embolism  --findings at this time are consistent with an unprovoked VTE  --will order at each visit CMP and CBC to assure labs are adequate for DOAC therapy  --ruled out APS with negative anticardiolipin and anti beta2 glycoprotein antibodies.  Lupus anticoagulant panel would be altered by presence of blood thinner, will hold on this testing.   --recommend the patient continue eliquis 5mg  BID.  Due to the unprovoked nature of this VTE we will need to continue anticoagulation indefinitely. PLAN: --Currently on Eliquis 2.5 mg twice daily for maintenance therapy. Recommend to continue --patient denies any bleeding or dark stools on this medication.  It is well tolerated. No difficulties accessing/affording the medication  --Labs today show white blood cell 7.9, Hgb 15.9, MCV 93.6, Plt 218, creatinine and LFTs normal. --RTC in 6 months' time with strict return precautions for overt signs of bleeding.  No orders of the defined types were placed in this encounter.   All questions were answered. The patient knows to call the clinic with any problems, questions or concerns.  I have spent a total of 25 minutes minutes of face-to-face and non-face-to-face time, preparing to see the patient, performing a medically appropriate examination, counseling and educating the patient, documenting clinical information in the electronic health record, independently interpreting results and communicating results to the patient, and care coordination.   Georga Kaufmann PA-C Dept of Hematology  and Oncology [facility] Phone: [phone]   04/12/2023 8:32 AM

## 2023-05-07 ENCOUNTER — Other Ambulatory Visit: Payer: Self-pay | Admitting: Cardiovascular Disease

## 2023-05-07 DIAGNOSIS — I252 Old myocardial infarction: Secondary | ICD-10-CM

## 2023-05-07 DIAGNOSIS — I10 Essential (primary) hypertension: Secondary | ICD-10-CM

## 2023-07-11 ENCOUNTER — Encounter: Payer: Self-pay | Admitting: Cardiovascular Disease

## 2023-09-28 ENCOUNTER — Ambulatory Visit: Payer: Commercial Managed Care - PPO | Attending: Cardiovascular Disease | Admitting: Cardiovascular Disease

## 2023-09-28 ENCOUNTER — Encounter: Payer: Self-pay | Admitting: Cardiovascular Disease

## 2023-09-28 VITALS — BP 100/68 | HR 81 | Resp 16 | Ht 72.0 in | Wt 199.8 lb

## 2023-09-28 DIAGNOSIS — I2510 Atherosclerotic heart disease of native coronary artery without angina pectoris: Secondary | ICD-10-CM

## 2023-09-28 DIAGNOSIS — E782 Mixed hyperlipidemia: Secondary | ICD-10-CM | POA: Diagnosis not present

## 2023-09-28 DIAGNOSIS — I10 Essential (primary) hypertension: Secondary | ICD-10-CM

## 2023-09-28 MED ORDER — METOPROLOL SUCCINATE ER 25 MG PO TB24
25.0000 mg | ORAL_TABLET | Freq: Every day | ORAL | 3 refills | Status: AC
Start: 2023-09-28 — End: ?

## 2023-09-28 NOTE — Assessment & Plan Note (Signed)
 The patient is doing very well.  He is having no anginal symptoms.  He is lost a lot of weight and feeling much better overall.  He will continue on clopidogrel in the setting of his multiple MIs.

## 2023-09-28 NOTE — Assessment & Plan Note (Signed)
 Treated with atorvastatin 80 mg daily.  The patient is fasting today and I will update his labs.  Will check CBC, c-Met, lipid panel, hemoglobin A1c.  Last lipids from 1 year ago showed an LDL cholesterol of 58.

## 2023-09-28 NOTE — Patient Instructions (Signed)
 Medication Instructions:  Stop Lisinopril CHANGE Metoprolol Tartrate to Metoprolol Succinate (Toprol XL) 25mg  daily *If you need a refill on your cardiac medications before your next appointment, please call your pharmacy*  Lab Work: CBC, CMET, Lipids, A1C If you have labs (blood work) drawn today and your tests are completely normal, you will receive your results only by: MyChart Message (if you have MyChart) OR A paper copy in the mail If you have any lab test that is abnormal or we need to change your treatment, we will call you to review the results.  Follow-Up: At [facility], you and your health needs are our priority.  As part of our continuing mission to provide you with exceptional heart care, we have created designated Provider Care Teams.  These Care Teams include your primary Cardiologist (physician) and Advanced Practice Providers (APPs -  Physician Assistants and Nurse Practitioners) who all work together to provide you with the care you need, when you need it.  Your next appointment:   1 year(s)  Provider:   Tonny Bollman, MD      1st Floor: - Lobby - Registration  - Pharmacy  - Lab - Cafe  2nd Floor: - PV Lab - Diagnostic Testing (echo, CT, nuclear med)  3rd Floor: - Vacant  4th Floor: - TCTS (cardiothoracic surgery) - AFib Clinic - Structural Heart Clinic - Vascular Surgery  - Vascular Ultrasound  5th Floor: - HeartCare Cardiology (general and EP) - Clinical Pharmacy for coumadin, hypertension, lipid, weight-loss medications, and med management appointments    Valet parking services will be available as well.

## 2023-09-28 NOTE — Progress Notes (Signed)
 Cardiology Office Note:    Date:  09/28/2023   ID:  Mario Underwood, DOB 1961-07-16, MRN [id number]  PCP:  Marcell Anger, NP   [facility] Providers Cardiologist:  Tonny Bollman, MD Sleep Medicine:  Armanda Magic, MD     Referring MD: Marcell Anger, NP   Chief Complaint  Patient presents with   Coronary artery disease involving native coronary artery of   Follow-up    1 year    History of Present Illness:    Mario Underwood is a 63 y.o. male with a hx of coronary artery disease, presenting for follow-up evaluation.  The patient initially presented in 2012 with an acute inferior wall MI treated with drug-eluting stent implantation in the right coronary artery, followed by staged PCI to proximal LAD.  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  He was noted to have very late stent thrombosis of the RCA.  He was treated with multiple overlapping drug-eluting stents.  The LAD stent was noted to be patent.  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  He was ultimately treated with apixaban at full dose, and then his dose was reduced to 2.5 mg twice daily for long-term prevention.   The patient is here alone today.  He is lost a lot of weight on Ozempic.  He is feeling much better but still has some generalized fatigue and malaise.  He wonders if this is because his blood pressure has been running so low.  His systolic readings are oftentimes in the 90s.  He has not had presyncope or frank syncope.  No chest pain, chest pressure, shortness of breath, or heart palpitations.  Current Medications: Current Meds  Medication Sig   albuterol (PROVENTIL HFA;VENTOLIN HFA) 108 (90 BASE) MCG/ACT inhaler Inhale 1 puff into the lungs 2 (two) times daily as needed for wheezing.   allopurinol (ZYLOPRIM) 100 MG tablet Take 200 mg by mouth daily.   apixaban  (ELIQUIS) 2.5 MG TABS tablet Take 1 tablet (2.5 mg total) by mouth 2 (two) times daily.   atorvastatin (LIPITOR) 80 MG tablet Take 1 tablet (80 mg total) by mouth daily.   clopidogrel (PLAVIX) 75 MG tablet Take 1 tablet (75 mg total) by mouth daily.   Glucosamine-Chondroitin-MSM 500-200-150 MG TABS Take 1 tablet by mouth daily.   metoprolol succinate (TOPROL XL) 25 MG 24 hr tablet Take 1 tablet (25 mg total) by mouth daily.   Multiple Vitamin (DAILY MULTIVITAMIN PO) Take 1 tablet by mouth daily.   nitroGLYCERIN (NITROSTAT) 0.4 MG SL tablet DISSOLVE 1 TABLET UNDER THE TONGUE EVERY 5 MINUTES AS NEEDED FOR CHEST PAIN. MAX OF 3 DOSES, THEN 911.   Omega-3 Fatty Acids (FISH OIL PO) Take 1 capsule by mouth daily.   OZEMPIC, 1 MG/DOSE, 4 MG/3ML SOPN Inject 1 mg into the skin once a week.   [DISCONTINUED] lisinopril (ZESTRIL) 10 MG tablet Take 1 tablet (10 mg total) by mouth daily.   [DISCONTINUED] metoprolol tartrate (LOPRESSOR) 25 MG tablet Take 1 tablet (25 mg total) by mouth 2 (two) times daily.     Allergies:   Penicillins   ROS:   Please see the history of present illness.    All other systems reviewed and are negative.  EKGs/Labs/Other Studies Reviewed:    The following studies were reviewed today: Cardiac Studies & Procedures   ______________________________________________________________________________________________  ECHOCARDIOGRAM  ECHOCARDIOGRAM COMPLETE 02/21/2022  Narrative ECHOCARDIOGRAM REPORT    Patient Name:   Mario Underwood Date of Exam: 02/21/2022 Medical Rec #:  [id number]       Height:       72.0 in Accession #:    [id number]      Weight:       240.2 lb Date of Birth:  05-24-1961        BSA:          2.303 m Patient Age:    61 years        BP:           134/87 mmHg Patient Gender: M               HR:           66 bpm. Exam Location:  Inpatient  Procedure: 2D Echo, Cardiac Doppler and Color Doppler  Indications:    Dyspnea  History:        Patient has no  prior history of Echocardiogram examinations. CAD and Previous Myocardial Infarction, Signs/Symptoms:Shortness of Breath; Risk Factors:Hypertension and Dyslipidemia. DOE.  Sonographer:    Ross Ludwig RDCS (AE) Referring Phys: [id number] Ernie Avena  IMPRESSIONS   1. Left ventricular ejection fraction, by estimation, is 45 to 50%. The left ventricle has mildly decreased function. The left ventricle demonstrates regional wall motion abnormalities (see scoring diagram/findings for description). There is moderate left ventricular hypertrophy. Left ventricular diastolic parameters are consistent with Grade II diastolic dysfunction (pseudonormalization). 2. Right ventricular systolic function is normal. The right ventricular size is normal. 3. Left atrial size was moderately dilated. 4. The mitral valve is normal in structure. Trivial mitral valve regurgitation. No evidence of mitral stenosis. 5. The aortic valve is normal in structure. Aortic valve regurgitation is not visualized. No aortic stenosis is present. 6. The inferior vena cava is normal in size with greater than 50% respiratory variability, suggesting right atrial pressure of 3 mmHg.  FINDINGS Left Ventricle: Left ventricular ejection fraction, by estimation, is 45 to 50%. The left ventricle has mildly decreased function. The left ventricle demonstrates regional wall motion abnormalities. The left ventricular internal cavity size was normal in size. There is moderate left ventricular hypertrophy. Left ventricular diastolic parameters are consistent with Grade II diastolic dysfunction (pseudonormalization).   LV Wall Scoring: The basal anteroseptal segment is dyskinetic.  Right Ventricle: The right ventricular size is normal. No increase in right ventricular wall thickness. Right ventricular systolic function is normal.  Left Atrium: Left atrial size was moderately dilated.  Right Atrium: Right atrial size was normal in  size.  Pericardium: There is no evidence of pericardial effusion.  Mitral Valve: The mitral valve is normal in structure. Trivial mitral valve regurgitation. No evidence of mitral valve stenosis.  Tricuspid Valve: The tricuspid valve is normal in structure. Tricuspid valve regurgitation is not demonstrated. No evidence of tricuspid stenosis.  Aortic Valve: The aortic valve is normal in structure. Aortic valve regurgitation is not visualized. No aortic stenosis is present. Aortic valve mean gradient measures 2.0 mmHg. Aortic valve peak gradient measures 3.7 mmHg. Aortic valve area, by VTI measures 4.79 cm.  Pulmonic Valve: The pulmonic valve was normal in structure. Pulmonic valve regurgitation is not visualized. No evidence of pulmonic stenosis.  Aorta: The aortic root is normal in size and structure.  Venous: The inferior vena cava is normal in size with greater than 50% respiratory variability, suggesting right atrial pressure of 3 mmHg.  IAS/Shunts: No  atrial level shunt detected by color flow Doppler.   LEFT VENTRICLE PLAX 2D LVIDd:         2.70 cm      Diastology LVIDs:         2.00 cm      LV e' medial:    8.16 cm/s LV PW:         1.50 cm      LV E/e' medial:  9.7 LV IVS:        1.50 cm      LV e' lateral:   17.80 cm/s LVOT diam:     2.50 cm      LV E/e' lateral: 4.5 LV SV:         98 LV SV Index:   43 LVOT Area:     4.91 cm  LV Volumes (MOD) LV vol d, MOD A2C: 136.0 ml LV vol d, MOD A4C: 155.0 ml LV vol s, MOD A2C: 58.6 ml LV vol s, MOD A4C: 69.2 ml LV SV MOD A2C:     77.4 ml LV SV MOD A4C:     155.0 ml LV SV MOD BP:      78.6 ml  RIGHT VENTRICLE             IVC RV Basal diam:  2.50 cm     IVC diam: 1.30 cm RV S prime:     15.60 cm/s TAPSE (M-mode): 2.1 cm  LEFT ATRIUM             Index        RIGHT ATRIUM           Index LA diam:        4.40 cm 1.91 cm/m   RA Area:     14.40 cm LA Vol (A2C):   82.3 ml 35.74 ml/m  RA Volume:   28.50 ml  12.38 ml/m LA Vol  (A4C):   96.9 ml 42.08 ml/m LA Biplane Vol: 93.4 ml 40.56 ml/m AORTIC VALVE AV Area (Vmax):    5.03 cm AV Area (Vmean):   4.64 cm AV Area (VTI):     4.79 cm AV Vmax:           96.20 cm/s AV Vmean:          62.900 cm/s AV VTI:            0.205 m AV Peak Grad:      3.7 mmHg AV Mean Grad:      2.0 mmHg LVOT Vmax:         98.50 cm/s LVOT Vmean:        59.500 cm/s LVOT VTI:          0.200 m LVOT/AV VTI ratio: 0.98  AORTA Ao Root diam: 3.80 cm Ao Asc diam:  3.90 cm  MITRAL VALVE MV Area (PHT): 2.90 cm    SHUNTS MV Decel Time: 262 msec    Systemic VTI:  0.20 m MV E velocity: 79.50 cm/s  Systemic Diam: 2.50 cm MV A velocity: 59.90 cm/s MV E/A ratio:  1.33  Donato Schultz MD Electronically signed by Donato Schultz MD Signature Date/Time: 02/21/2022/4:31:30 PM    Final          ______________________________________________________________________________________________      EKG:   EKG Interpretation Date/Time:  Thursday September 28 2023 09:16:34 EST Ventricular Rate:  80 PR Interval:  158 QRS Duration:  82 QT Interval:  346 QTC Calculation: 399 R Axis:   -29  Text Interpretation: Normal sinus rhythm Minimal voltage criteria for LVH, may be normal variant ( R in aVL ) Inferior infarct (cited on or before 15-Nov-2011) Anterior infarct , age undetermined When compared with ECG of 21-Feb-2022 09:59, No significant change was found Confirmed by Tonny Bollman [phone]) on 09/28/2023 9:49:36 AM    Recent Labs: 04/12/2023: ALT 24; BUN 13; Creatinine 1.14; Hemoglobin 15.9; Platelet Count 218; Potassium 4.4; Sodium 138  Recent Lipid Panel    Component Value Date/Time   CHOL 124 10/10/2022 0942   TRIG 182 (H) 10/10/2022 0942   HDL 36 (L) 10/10/2022 0942   CHOLHDL 3.4 10/10/2022 0942   CHOLHDL 3.4 02/19/2016 1021   VLDL 30 02/19/2016 1021   LDLCALC 58 10/10/2022 0942   LDLDIRECT  12/22/2010 0345    82 (NOTE) ATP III Classification (LDL):      < 100        mg/dL          Optimal     604 - 129     mg/dL         Near or Above Optimal     130 - 159     mg/dL         Borderline High     160 - 189     mg/dL         High      > 540        mg/dL         Very  High      Risk Assessment/Calculations:           STOP-Bang Score:          Physical Exam:    VS:  BP 100/68 (BP Location: Left Arm, Patient Position: Sitting, Cuff Size: Large)   Pulse 81   Resp 16   Ht 6' (1.829 m)   Wt 199 lb 12.8 oz (90.6 kg)   SpO2 95%   BMI 27.10 kg/m     Wt Readings from Last 3 Encounters:  09/28/23 199 lb 12.8 oz (90.6 kg)  04/12/23 226 lb 9.6 oz (102.8 kg)  11/14/22 248 lb 9.6 oz (112.8 kg)     GEN:  Well nourished, well developed in no acute distress HEENT: Normal NECK: No JVD; No carotid bruits LYMPHATICS: No lymphadenopathy CARDIAC: RRR, no murmurs, rubs, gallops RESPIRATORY:  Clear to auscultation without rales, wheezing or rhonchi  ABDOMEN: Soft, non-tender, non-distended MUSCULOSKELETAL:  No edema; No deformity  SKIN: Warm and dry NEUROLOGIC:  Alert and oriented x 3 PSYCHIATRIC:  Normal affect   Assessment & Plan Coronary artery disease involving native coronary artery of native heart without angina pectoris The patient is doing very well.  He is having no anginal symptoms.  He is lost a lot of weight and feeling much better overall.  He will continue on clopidogrel in the setting of his multiple MIs. Essential hypertension Blood pressure is overtreated with all of his weight loss.  I recommended that he stop lisinopril and cut his beta-blocker by 50%.  Rather than having him split the 25 mg metoprolol to tartrate so I am going to start him on metoprolol succinate 25 mg daily.  He will keep an eye on his home blood pressure readings. Mixed hyperlipidemia Treated with atorvastatin 80 mg daily.  The patient is fasting today and I will update his labs.  Will check CBC, c-Met, lipid panel, hemoglobin A1c.  Last lipids from 1 year ago showed an LDL cholesterol  of 58.      Medication Adjustments/Labs and Tests Ordered: Current medicines are reviewed at length with the patient today.  Concerns regarding medicines are outlined above.  Orders Placed This Encounter  Procedures   CBC   Comprehensive metabolic panel   Lipid panel   Hemoglobin A1c   EKG 12-Lead   Meds ordered this encounter  Medications   metoprolol succinate (TOPROL XL) 25 MG 24 hr tablet    Sig: Take 1 tablet (25 mg total) by mouth daily.    Dispense:  90 tablet    Refill:  3    Replacing lopressor    Patient Instructions  Medication Instructions:  Stop Lisinopril CHANGE Metoprolol Tartrate to Metoprolol Succinate (Toprol XL) 25mg  daily *If you need a refill on your cardiac medications before your next appointment, please call your pharmacy*  Lab Work: CBC, CMET, Lipids, A1C If you have labs (blood work) drawn today and your tests are completely normal, you will receive your results only by: MyChart Message (if you have MyChart) OR A paper copy in the mail If you have any lab test that is abnormal or we need to change your treatment, we will call you to review the results.  Follow-Up: At [facility], you and your health needs are our priority.  As part of our continuing mission to provide you with exceptional heart care, we have created designated Provider Care Teams.  These Care Teams include your primary Cardiologist (physician) and Advanced Practice Providers (APPs -  Physician Assistants and Nurse Practitioners) who all work together to provide you with the care you need, when you need it.  Your next appointment:   1 year(s)  Provider:   Tonny Bollman, MD      1st Floor: - Lobby - Registration  - Pharmacy  - Lab - Cafe  2nd Floor: - PV Lab - Diagnostic Testing (echo, CT, nuclear med)  3rd Floor: - Vacant  4th Floor: - TCTS (cardiothoracic surgery) - AFib Clinic - Structural Heart Clinic - Vascular Surgery  - Vascular  Ultrasound  5th Floor: - HeartCare Cardiology (general and EP) - Clinical Pharmacy for coumadin, hypertension, lipid, weight-loss medications, and med management appointments    Valet parking services will be available as well.     Signed, Tonny Bollman, MD  09/28/2023 12:39 PM    [facility]

## 2023-09-29 LAB — CBC
Hematocrit: 49.1 % (ref 37.5–51.0)
Hemoglobin: 16 g/dL (ref 13.0–17.7)
MCH: 30.5 pg (ref 26.6–33.0)
MCHC: 32.6 g/dL (ref 31.5–35.7)
MCV: 94 fL (ref 79–97)
Platelets: 251 10*3/uL (ref 150–450)
RBC: 5.25 x10E6/uL (ref 4.14–5.80)
RDW: 13.2 % (ref 11.6–15.4)
WBC: 8.9 10*3/uL (ref 3.4–10.8)

## 2023-09-29 LAB — HEMOGLOBIN A1C
Est. average glucose Bld gHb Est-mCnc: 114 mg/dL
Hgb A1c MFr Bld: 5.6 % (ref 4.8–5.6)

## 2023-09-29 LAB — COMPREHENSIVE METABOLIC PANEL
ALT: 23 IU/L (ref 0–44)
AST: 22 IU/L (ref 0–40)
Albumin: 4.5 g/dL (ref 3.8–4.9)
Alkaline Phosphatase: 126 IU/L — ABNORMAL HIGH (ref 44–121)
BUN/Creatinine Ratio: 13 (ref 10–24)
BUN: 11 mg/dL (ref 8–27)
Bilirubin Total: 0.7 mg/dL (ref 0.0–1.2)
CO2: 22 mmol/L (ref 20–29)
Calcium: 9.7 mg/dL (ref 8.6–10.2)
Chloride: 104 mmol/L (ref 96–106)
Creatinine, Ser: 0.83 mg/dL (ref 0.76–1.27)
Globulin, Total: 1.9 g/dL (ref 1.5–4.5)
Glucose: 76 mg/dL (ref 70–99)
Potassium: 4.9 mmol/L (ref 3.5–5.2)
Sodium: 143 mmol/L (ref 134–144)
Total Protein: 6.4 g/dL (ref 6.0–8.5)
eGFR: 100 mL/min/{1.73_m2} (ref >59)

## 2023-09-29 LAB — LIPID PANEL
Chol/HDL Ratio: 3.6 ratio (ref 0.0–5.0)
Cholesterol, Total: 129 mg/dL (ref 100–199)
HDL: 36 mg/dL — ABNORMAL LOW (ref >39)
LDL Chol Calc (NIH): 74 mg/dL (ref 0–99)
Triglycerides: 99 mg/dL (ref 0–149)
VLDL Cholesterol Cal: 19 mg/dL (ref 5–40)

## 2023-10-03 NOTE — Progress Notes (Unsigned)
 [facility] [phone]   [phone]  PROGRESS NOTE  Patient Care Team: Marcell Anger, NP as PCP - General (Internal Medicine) Tonny Bollman, MD as PCP - Cardiology (Cardiology) Quintella Reichert, MD as PCP - Sleep Medicine (Cardiology)  Hematological/Oncological History # Unprovoked Pulmonary Emboli  # Right Lower Extremity DVT 02/21/2022: CT angio showed pulmonary emboli as proximally as the distal right main pulmonary artery, with thrombus seen in the right upper and lower lobar and segmental arteries, which is occlusive in the right lower lobe segmental arteries.  02/22/2022: LE US showed acute deep vein thrombosis involving the right popliteal vein, and right peroneal veins. 04/07/2022: establish care with Dr. Leonides Schanz   Interval History:  Mario Underwood 63 y.o. male with medical history significant for an unprovoked pulmonary emboli and right lower extremity DVT who presents for a follow up visit. The patient's last visit was on 04/12/2023. In the interim since the last visit he has continued faithfully on Eliquis anticoagulation.  On exam today Mr. Ernsberger reports he has been doing very well overall and interim since her last visit.  He centimeter changes in his health.  He did start Ozempic therapy and is lost a considerable about a week.  He did weight is much as 251 pounds and is down to 199 pounds today.  He reports that the Ozempic does cause constipation but no nausea or vomiting.  He reports that his blood pressure medications have also been weaned off due to hypotension last week.  He reports he is taking his Eliquis faithfully twice daily with no bleeding, bruising, or dark stools, we does have to be careful when shaving.  There is no signs or symptoms concerning for recurrent VTE such as chest pain, shortness of breath, lower extremity swelling.  He reports that his medication cost him $25 per month.  Other than a sinus infection about 1 month ago  he has no infectious symptoms.. He denies any signs of recurrent VTE including peripheral edema, shortness of breath or chest pain. He has no other complaints. Rest of the ROS is below.   MEDICAL HISTORY:  Past Medical History:  Diagnosis Date   Asthma    once.week   CAD (coronary artery disease)    A.  Inferior STEMI 12/12/10, treated with a Xience DES;  B.  Staged PCI of the LAD with a Promus DES;  C.  EF 55%, inferior AK   Hyperlipemia    Hypertension    Myocardial infarction (HCC) 2012   2 stents placed   OSA on CPAP    mild OSA with an AHI of 6.1/hr   Osteoarthritis    Right knee TKR pending with Dr. Charlann Boxer   Pneumonia     SURGICAL HISTORY: Past Surgical History:  Procedure Laterality Date   ARTHROSCOPIC REPAIR ACL Right    CORONARY STENT PLACEMENT  2012   HERNIA REPAIR     left inguinal   TOTAL KNEE ARTHROPLASTY Right 10/07/2013   Procedure: RIGHT TOTAL KNEE ARTHROPLASTY;  Surgeon: Dannielle Huh, MD;  Location: [facility] OR;  Service: Orthopedics;  Laterality: Right;   TOTAL KNEE ARTHROPLASTY Left 09/25/2017   Procedure: LEFT TOTAL KNEE ARTHROPLASTY;  Surgeon: Dannielle Huh, MD;  Location: [facility] OR;  Service: Orthopedics;  Laterality: Left;   urology procedure      SOCIAL HISTORY: Social History   Socioeconomic History   Marital status: Married    Spouse name: Not on file   Number of children: Not  on file   Years of education: Not on file   Highest education level: Not on file  Occupational History   Not on file  Tobacco Use   Smoking status: Never   Smokeless tobacco: Current    Types: Chew  Vaping Use   Vaping status: Never Used  Substance and Sexual Activity   Alcohol use: Yes    Comment: weekends   Drug use: No   Sexual activity: Yes  Other Topics Concern   Not on file  Social History Narrative   Not on file   Social Drivers of Health   Financial Resource Strain: Not on file  Food Insecurity: Not on file  Transportation Needs: No Transportation Needs  (01/19/2023)   Received [facility]   PRAPARE - Transportation    Lack of Transportation (Medical): No    Lack of Transportation (Non-Medical): No  Physical Activity: Not on file  Stress: Not on file  Social Connections: Not on file  Intimate Partner Violence: Not on file    FAMILY HISTORY: Family History  Problem Relation Age of Onset   Heart disease Mother    Coronary artery disease Sister     ALLERGIES:  is allergic to penicillins.  MEDICATIONS:  Current Outpatient Medications  Medication Sig Dispense Refill   albuterol (PROVENTIL HFA;VENTOLIN HFA) 108 (90 BASE) MCG/ACT inhaler Inhale 1 puff into the lungs 2 (two) times daily as needed for wheezing. 1 Inhaler 2   allopurinol (ZYLOPRIM) 100 MG tablet Take 200 mg by mouth daily.     apixaban (ELIQUIS) 2.5 MG TABS tablet Take 1 tablet (2.5 mg total) by mouth 2 (two) times daily. 180 tablet 1   atorvastatin (LIPITOR) 80 MG tablet Take 1 tablet (80 mg total) by mouth daily. 90 tablet 3   clopidogrel (PLAVIX) 75 MG tablet Take 1 tablet (75 mg total) by mouth daily. 90 tablet 3   Glucosamine-Chondroitin-MSM 500-200-150 MG TABS Take 1 tablet by mouth daily.     metoprolol succinate (TOPROL XL) 25 MG 24 hr tablet Take 1 tablet (25 mg total) by mouth daily. 90 tablet 3   Multiple Vitamin (DAILY MULTIVITAMIN PO) Take 1 tablet by mouth daily.     nitroGLYCERIN (NITROSTAT) 0.4 MG SL tablet DISSOLVE 1 TABLET UNDER THE TONGUE EVERY 5 MINUTES AS NEEDED FOR CHEST PAIN. MAX OF 3 DOSES, THEN 911. 25 tablet 6   Omega-3 Fatty Acids (FISH OIL PO) Take 1 capsule by mouth daily.     OZEMPIC, 1 MG/DOSE, 4 MG/3ML SOPN Inject 1 mg into the skin once a week.     No current facility-administered medications for this visit.    REVIEW OF SYSTEMS:   Constitutional: ( - ) fevers, ( - )  chills , ( - ) night sweats Eyes: ( - ) blurriness of vision, ( - ) double vision, ( - ) watery eyes Ears, nose, mouth, throat, and face: ( - ) mucositis, ( - )  sore throat Respiratory: ( - ) cough, ( - ) dyspnea, ( - ) wheezes Cardiovascular: ( - ) palpitation, ( - ) chest discomfort, ( - ) lower extremity swelling Gastrointestinal:  ( - ) nausea, ( - ) heartburn, ( - ) change in bowel habits Skin: ( - ) abnormal skin rashes Lymphatics: ( - ) new lymphadenopathy, ( - ) easy bruising Neurological: ( - ) numbness, ( - ) tingling, ( - ) new weaknesses Behavioral/Psych: ( - ) mood change, ( - ) new changes  All other systems were reviewed with the patient and are negative.  PHYSICAL EXAMINATION:  Vitals:   10/04/23 0817  BP: 124/89  Pulse: 86  Resp: 16  Temp: 98 F (36.7 C)  SpO2: 99%     Filed Weights   10/04/23 0817  Weight: 199 lb 11.2 oz (90.6 kg)      GENERAL: Well-appearing middle-aged Caucasian male, alert, no distress and comfortable SKIN: skin color, texture, turgor are normal, no rashes or significant lesions EYES: conjunctiva are pink and non-injected, sclera clear LUNGS: clear to auscultation and percussion with normal breathing effort HEART: regular rate & rhythm and no murmurs and no lower extremity edema Musculoskeletal: no cyanosis of digits and no clubbing  PSYCH: alert & oriented x 3, fluent speech NEURO: no focal motor/sensory deficits  LABORATORY DATA:  I have reviewed the data as listed    Latest Ref Rng & Units 10/04/2023    7:50 AM 09/28/2023   10:32 AM 04/12/2023    7:55 AM  CBC  WBC 4.0 - 10.5 K/uL 10.7  8.9  7.9   Hemoglobin 13.0 - 17.0 g/dL 16.1  09.6  04.5   Hematocrit 39.0 - 52.0 % 46.9  49.1  47.9   Platelets 150 - 400 K/uL 219  251  218        Latest Ref Rng & Units 10/04/2023    7:50 AM 09/28/2023   10:32 AM 04/12/2023    7:55 AM  CMP  Glucose 70 - 99 mg/dL 409  76  99   BUN 6 - 20 mg/dL 10  11  13    Creatinine 0.61 - 1.24 mg/dL 8.11  9.14  7.82   Sodium 135 - 145 mmol/L 139  143  138   Potassium 3.5 - 5.1 mmol/L 4.1  4.9  4.4   Chloride 98 - 111 mmol/L 107  104  105   CO2 22 - 32 mmol/L  28  22  29    Calcium 8.9 - 10.3 mg/dL 9.0  9.7  9.4   Total Protein 6.5 - 8.1 g/dL 6.6  6.4  6.7   Total Bilirubin 0.0 - 1.2 mg/dL 0.7  0.7  0.7   Alkaline Phos 38 - 126 U/L 102  126  105   AST 15 - 41 U/L 22  22  20    ALT 0 - 44 U/L 26  23  24     RADIOGRAPHIC STUDIES: No results found.  ASSESSMENT & PLAN Mario Underwood is a 63 y.o. male with medical history significant for an unprovoked pulmonary emboli and right lower extremity DVT who presents for a follow up visit.  After review of the labs, review of the records, and discussion with the patient the patients findings are most consistent with a massive unprovoked pulmonary embolism and right lower extremity DVT.  #Unprovoked DVT/Pulmonary Embolism  --findings at this time are consistent with an unprovoked VTE  --will order at each visit CMP and CBC to assure labs are adequate for DOAC therapy  --ruled out APS with negative anticardiolipin and anti beta2 glycoprotein antibodies.  Lupus anticoagulant panel would be altered by presence of blood thinner, will hold on this testing.   --recommend the patient continue eliquis 5mg  BID.  Due to the unprovoked nature of this VTE we will need to continue anticoagulation indefinitely. PLAN: --Currently on Eliquis 2.5 mg twice daily for maintenance therapy. Recommend to continue --patient denies any bleeding or dark stools on this medication.  It is well tolerated. No  difficulties accessing/affording the medication  --Labs today show white blood cell 10.7, hemoglobin 15.9, MCV 91.8, platelets 219, creatinine and LFTs normal. --RTC in 6 months' time with strict return precautions for overt signs of bleeding.  No orders of the defined types were placed in this encounter.   All questions were answered. The patient knows to call the clinic with any problems, questions or concerns.  I have spent a total of 25 minutes minutes of face-to-face and non-face-to-face time, preparing to see the patient,  performing a medically appropriate examination, counseling and educating the patient, documenting clinical information in the electronic health record, independently interpreting results and communicating results to the patient, and care coordination.   Ulysees Barns, MD Department of Hematology/Oncology [facility] Phone: [phone] Pager: [phone] Email: [email or web address]    10/04/2023 9:08 AM

## 2023-10-04 ENCOUNTER — Other Ambulatory Visit: Payer: Self-pay | Admitting: Hematology and Oncology

## 2023-10-04 ENCOUNTER — Inpatient Hospital Stay: Payer: Commercial Managed Care - PPO | Attending: Hematology and Oncology

## 2023-10-04 ENCOUNTER — Inpatient Hospital Stay (HOSPITAL_BASED_OUTPATIENT_CLINIC_OR_DEPARTMENT_OTHER): Payer: Commercial Managed Care - PPO | Admitting: Hematology and Oncology

## 2023-10-04 VITALS — BP 124/89 | HR 86 | Temp 98.0°F | Resp 16 | Wt 199.7 lb

## 2023-10-04 DIAGNOSIS — Z86711 Personal history of pulmonary embolism: Secondary | ICD-10-CM | POA: Diagnosis present

## 2023-10-04 DIAGNOSIS — Z86718 Personal history of other venous thrombosis and embolism: Secondary | ICD-10-CM | POA: Insufficient documentation

## 2023-10-04 DIAGNOSIS — Z7901 Long term (current) use of anticoagulants: Secondary | ICD-10-CM | POA: Diagnosis not present

## 2023-10-04 DIAGNOSIS — I2699 Other pulmonary embolism without acute cor pulmonale: Secondary | ICD-10-CM

## 2023-10-04 DIAGNOSIS — Z79899 Other long term (current) drug therapy: Secondary | ICD-10-CM | POA: Insufficient documentation

## 2023-10-04 LAB — CMP (CANCER CENTER ONLY)
ALT: 26 U/L (ref 0–44)
AST: 22 U/L (ref 15–41)
Albumin: 4.2 g/dL (ref 3.5–5.0)
Alkaline Phosphatase: 102 U/L (ref 38–126)
Anion gap: 4 — ABNORMAL LOW (ref 5–15)
BUN: 10 mg/dL (ref 6–20)
CO2: 28 mmol/L (ref 22–32)
Calcium: 9 mg/dL (ref 8.9–10.3)
Chloride: 107 mmol/L (ref 98–111)
Creatinine: 0.9 mg/dL (ref 0.61–1.24)
GFR, Estimated: >60 mL/min (ref >60)
Glucose, Bld: 102 mg/dL — ABNORMAL HIGH (ref 70–99)
Potassium: 4.1 mmol/L (ref 3.5–5.1)
Sodium: 139 mmol/L (ref 135–145)
Total Bilirubin: 0.7 mg/dL (ref 0.0–1.2)
Total Protein: 6.6 g/dL (ref 6.5–8.1)

## 2023-10-04 LAB — CBC WITH DIFFERENTIAL (CANCER CENTER ONLY)
Abs Immature Granulocytes: 0.05 K/uL (ref 0.00–0.07)
Basophils Absolute: 0.1 K/uL (ref 0.0–0.1)
Basophils Relative: 1 %
Eosinophils Absolute: 0.2 K/uL (ref 0.0–0.5)
Eosinophils Relative: 2 %
HCT: 46.9 % (ref 39.0–52.0)
Hemoglobin: 15.9 g/dL (ref 13.0–17.0)
Immature Granulocytes: 1 %
Lymphocytes Relative: 19 %
Lymphs Abs: 2 K/uL (ref 0.7–4.0)
MCH: 31.1 pg (ref 26.0–34.0)
MCHC: 33.9 g/dL (ref 30.0–36.0)
MCV: 91.8 fL (ref 80.0–100.0)
Monocytes Absolute: 0.8 K/uL (ref 0.1–1.0)
Monocytes Relative: 8 %
Neutro Abs: 7.6 K/uL (ref 1.7–7.7)
Neutrophils Relative %: 69 %
Platelet Count: 219 K/uL (ref 150–400)
RBC: 5.11 MIL/uL (ref 4.22–5.81)
RDW: 14 % (ref 11.5–15.5)
WBC Count: 10.7 K/uL — ABNORMAL HIGH (ref 4.0–10.5)
nRBC: 0 % (ref 0.0–0.2)

## 2023-10-07 ENCOUNTER — Other Ambulatory Visit: Payer: Self-pay | Admitting: Cardiovascular Disease

## 2023-10-07 DIAGNOSIS — I252 Old myocardial infarction: Secondary | ICD-10-CM

## 2023-10-07 DIAGNOSIS — I10 Essential (primary) hypertension: Secondary | ICD-10-CM

## 2023-10-15 ENCOUNTER — Other Ambulatory Visit: Payer: Self-pay | Admitting: Physician Assistant

## 2023-10-26 ENCOUNTER — Ambulatory Visit: Payer: Commercial Managed Care - PPO | Admitting: Cardiovascular Disease

## 2023-12-11 ENCOUNTER — Other Ambulatory Visit: Payer: Self-pay | Admitting: Cardiovascular Disease

## 2024-02-13 ENCOUNTER — Encounter: Payer: Self-pay | Admitting: Cardiovascular Disease

## 2024-02-14 ENCOUNTER — Other Ambulatory Visit (HOSPITAL_COMMUNITY): Payer: Self-pay

## 2024-02-14 ENCOUNTER — Telehealth: Payer: Self-pay | Admitting: Pharmacy Technician

## 2024-02-14 NOTE — Telephone Encounter (Signed)
 From pt advice    Pharmacy Patient Advocate Encounter   Received notification from Patient Advice Request messages that prior authorization for wegovy is required/requested.   Insurance verification completed.   The patient is insured through [facility] .   Per test claim: PA required; PA submitted to above mentioned insurance via latent Key/confirmation #/EOC [facility] Status is pending

## 2024-02-15 NOTE — Telephone Encounter (Signed)
 Pharmacy Patient Advocate Encounter  Received notification from [facility] that Prior Authorization for wegovy has been DENIED.  Full denial letter will be uploaded to the media tab. See denial reason below.

## 2024-03-30 ENCOUNTER — Other Ambulatory Visit: Payer: Self-pay | Admitting: Physician Assistant

## 2024-04-05 ENCOUNTER — Inpatient Hospital Stay: Payer: Self-pay | Attending: Hematology and Oncology | Admitting: Hematology and Oncology

## 2024-04-05 ENCOUNTER — Inpatient Hospital Stay: Payer: Self-pay

## 2024-04-05 ENCOUNTER — Other Ambulatory Visit: Payer: Self-pay | Admitting: Hematology and Oncology

## 2024-04-05 VITALS — BP 128/86 | HR 65 | Temp 97.8°F | Resp 13 | Wt 196.6 lb

## 2024-04-05 DIAGNOSIS — Z79899 Other long term (current) drug therapy: Secondary | ICD-10-CM | POA: Insufficient documentation

## 2024-04-05 DIAGNOSIS — Z86711 Personal history of pulmonary embolism: Secondary | ICD-10-CM | POA: Insufficient documentation

## 2024-04-05 DIAGNOSIS — I2699 Other pulmonary embolism without acute cor pulmonale: Secondary | ICD-10-CM | POA: Diagnosis not present

## 2024-04-05 DIAGNOSIS — Z7901 Long term (current) use of anticoagulants: Secondary | ICD-10-CM | POA: Insufficient documentation

## 2024-04-05 DIAGNOSIS — Z86718 Personal history of other venous thrombosis and embolism: Secondary | ICD-10-CM | POA: Insufficient documentation

## 2024-04-05 LAB — CBC WITH DIFFERENTIAL (CANCER CENTER ONLY)
Abs Immature Granulocytes: 0.02 K/uL (ref 0.00–0.07)
Basophils Absolute: 0 K/uL (ref 0.0–0.1)
Basophils Relative: 1 %
Eosinophils Absolute: 0.6 K/uL — ABNORMAL HIGH (ref 0.0–0.5)
Eosinophils Relative: 7 %
HCT: 49.3 % (ref 39.0–52.0)
Hemoglobin: 16.8 g/dL (ref 13.0–17.0)
Immature Granulocytes: 0 %
Lymphocytes Relative: 27 %
Lymphs Abs: 2.1 K/uL (ref 0.7–4.0)
MCH: 30.9 pg (ref 26.0–34.0)
MCHC: 34.1 g/dL (ref 30.0–36.0)
MCV: 90.6 fL (ref 80.0–100.0)
Monocytes Absolute: 0.6 K/uL (ref 0.1–1.0)
Monocytes Relative: 8 %
Neutro Abs: 4.5 K/uL (ref 1.7–7.7)
Neutrophils Relative %: 57 %
Platelet Count: 210 K/uL (ref 150–400)
RBC: 5.44 MIL/uL (ref 4.22–5.81)
RDW: 13.4 % (ref 11.5–15.5)
WBC Count: 7.9 K/uL (ref 4.0–10.5)
nRBC: 0 % (ref 0.0–0.2)

## 2024-04-05 LAB — CMP (CANCER CENTER ONLY)
ALT: 22 U/L (ref 0–44)
AST: 23 U/L (ref 15–41)
Albumin: 4.4 g/dL (ref 3.5–5.0)
Alkaline Phosphatase: 118 U/L (ref 38–126)
Anion gap: 4 — ABNORMAL LOW (ref 5–15)
BUN: 11 mg/dL (ref 8–23)
CO2: 29 mmol/L (ref 22–32)
Calcium: 9.5 mg/dL (ref 8.9–10.3)
Chloride: 106 mmol/L (ref 98–111)
Creatinine: 0.93 mg/dL (ref 0.61–1.24)
GFR, Estimated: >60 mL/min (ref >60)
Glucose, Bld: 96 mg/dL (ref 70–99)
Potassium: 5 mmol/L (ref 3.5–5.1)
Sodium: 139 mmol/L (ref 135–145)
Total Bilirubin: 0.5 mg/dL (ref 0.0–1.2)
Total Protein: 6.9 g/dL (ref 6.5–8.1)

## 2024-04-05 NOTE — Progress Notes (Signed)
 [facility] Telephone:([phone]) [phone]   [phone]  PROGRESS NOTE  Patient Care Team: Gretta Coolidge SQUIBB, NP as PCP - General (Internal Medicine) Wonda Sharper, MD as PCP - Cardiology (Cardiology) Shlomo Wilbert SAUNDERS, MD as PCP - Sleep Medicine (Cardiology)  Hematological/Oncological History # Unprovoked Pulmonary Emboli  # Right Lower Extremity DVT 02/21/2022: CT angio showed pulmonary emboli as proximally as the distal right main pulmonary artery, with thrombus seen in the right upper and lower lobar and segmental arteries, which is occlusive in the right lower lobe segmental arteries.  02/22/2022: LE US  showed acute deep vein thrombosis involving the right popliteal vein, and right peroneal veins. 04/07/2022: establish care with Dr. Federico   Interval History:  Mario Underwood 63 y.o. male with medical history significant for an unprovoked pulmonary emboli and right lower extremity DVT who presents for a follow up visit. The patient's last visit was on 10/04/2023. In the interim since the last visit he has continued faithfully on Eliquis  anticoagulation.  On exam today Mario Underwood reports he has been well overall in interim since her last visit.  He reports for Labor [place]  He reports in the last 6 months he has been quite well with no major changes in his health.  He reports his weight is below 200, which is his goal.  He reports that he does not have any upcoming planned surgery or dental work.  He denies any fevers, chills, sweats or nausea, vomiting, or diarrhea.  He reports recently his blood pressure was low and he switched to metoprolol  25 mg long-acting to prevent his blood pressure from dipping too low.  He reports he is taking his Eliquis  2.5 mg twice daily as prescribed.  It is costing $25 per month with a 2-year discount card.  He reports he is not having any major bleeding, bruising, or dark stools.  He reports that he gets cut or bumps  he does bleed and bruise.  He reports he does his best to try to be careful but he does garden.  Overall he feels quite well and has no additional questions concerns or complaints today.  Full 10 point ROS is otherwise negative.  MEDICAL HISTORY:  Past Medical History:  Diagnosis Date   Asthma    once.week   CAD (coronary artery disease)    A.  Inferior STEMI 12/12/10, treated with a Xience DES;  B.  Staged PCI of the LAD with a Promus DES;  C.  EF 55%, inferior AK   Hyperlipemia    Hypertension    Myocardial infarction (HCC) 2012   2 stents placed   OSA on CPAP    mild OSA with an AHI of 6.1/hr   Osteoarthritis    Right knee TKR pending with Dr. Ernie   Pneumonia     SURGICAL HISTORY: Past Surgical History:  Procedure Laterality Date   ARTHROSCOPIC REPAIR ACL Right    CORONARY STENT PLACEMENT  2012   HERNIA REPAIR     left inguinal   TOTAL KNEE ARTHROPLASTY Right 10/07/2013   Procedure: RIGHT TOTAL KNEE ARTHROPLASTY;  Surgeon: Marcey Raman, MD;  Location: [facility] OR;  Service: Orthopedics;  Laterality: Right;   TOTAL KNEE ARTHROPLASTY Left 09/25/2017   Procedure: LEFT TOTAL KNEE ARTHROPLASTY;  Surgeon: Raman Marcey, MD;  Location: [facility] OR;  Service: Orthopedics;  Laterality: Left;   urology procedure      SOCIAL HISTORY: Social History   Socioeconomic  History   Marital status: Married    Spouse name: Not on file   Number of children: Not on file   Years of education: Not on file   Highest education level: Not on file  Occupational History   Not on file  Tobacco Use   Smoking status: Never   Smokeless tobacco: Current    Types: Chew  Vaping Use   Vaping status: Never Used  Substance and Sexual Activity   Alcohol use: Yes    Comment: weekends   Drug use: No   Sexual activity: Yes  Other Topics Concern   Not on file  Social History Narrative   Not on file   Social Drivers of Health   Financial Resource Strain: Not on file  Food Insecurity: No Food Insecurity  (11/01/2023)   Received from [facility]   Hunger Vital Sign    Within the past 12 months, you worried that your food would run out before you got the money to buy more.: Never true    Within the past 12 months, the food you bought just didn't last and you didn't have money to get more.: Never true  Transportation Needs: No Transportation Needs (11/01/2023)   Received from [facility]   PRAPARE - Transportation    Lack of Transportation (Medical): No    Lack of Transportation (Non-Medical): No  Physical Activity: Not on file  Stress: Not on file  Social Connections: Not on file  Intimate Partner Violence: Not At Risk (11/01/2023)   Received from [facility]   Humiliation, Afraid, Rape, and Kick questionnaire    Within the last year, have you been afraid of your partner or ex-partner?: No    Within the last year, have you been humiliated or emotionally abused in other ways by your partner or ex-partner?: No    Within the last year, have you been kicked, hit, slapped, or otherwise physically hurt by your partner or ex-partner?: No    Within the last year, have you been raped or forced to have any kind of sexual activity by your partner or ex-partner?: No    FAMILY HISTORY: Family History  Problem Relation Age of Onset   Heart disease Mother    Coronary artery disease Sister     ALLERGIES:  is allergic to penicillins.  MEDICATIONS:  Current Outpatient Medications  Medication Sig Dispense Refill   albuterol  (PROVENTIL  HFA;VENTOLIN  HFA) 108 (90 BASE) MCG/ACT inhaler Inhale 1 puff into the lungs 2 (two) times daily as needed for wheezing. 1 Inhaler 2   allopurinol (ZYLOPRIM) 100 MG tablet Take 200 mg by mouth daily.     atorvastatin  (LIPITOR ) 80 MG tablet TAKE 1 TABLET BY MOUTH EVERY DAY 90 tablet 3   clopidogrel  (PLAVIX ) 75 MG tablet TAKE 1 TABLET BY MOUTH EVERY DAY 90 tablet 3   ELIQUIS  2.5 MG TABS tablet TAKE 1 TABLET BY MOUTH TWICE A DAY 60 tablet 5    Glucosamine-Chondroitin-MSM 500-200-150 MG TABS Take 1 tablet by mouth daily.     metoprolol  succinate (TOPROL  XL) 25 MG 24 hr tablet Take 1 tablet (25 mg total) by mouth daily. 90 tablet 3   Multiple Vitamin (DAILY MULTIVITAMIN PO) Take 1 tablet by mouth daily.     nitroGLYCERIN  (NITROSTAT ) 0.4 MG SL tablet DISSOLVE 1 TABLET UNDER THE TONGUE EVERY 5 MINUTES AS NEEDED FOR CHEST PAIN. MAX OF 3 DOSES, THEN 911. 25 tablet 6   Omega-3 Fatty Acids (FISH OIL PO) Take 1  capsule by mouth daily.     OZEMPIC, 1 MG/DOSE, 4 MG/3ML SOPN Inject 1 mg into the skin once a week.     No current facility-administered medications for this visit.    REVIEW OF SYSTEMS:   Constitutional: ( - ) fevers, ( - )  chills , ( - ) night sweats Eyes: ( - ) blurriness of vision, ( - ) double vision, ( - ) watery eyes Ears, nose, mouth, throat, and face: ( - ) mucositis, ( - ) sore throat Respiratory: ( - ) cough, ( - ) dyspnea, ( - ) wheezes Cardiovascular: ( - ) palpitation, ( - ) chest discomfort, ( - ) lower extremity swelling Gastrointestinal:  ( - ) nausea, ( - ) heartburn, ( - ) change in bowel habits Skin: ( - ) abnormal skin rashes Lymphatics: ( - ) new lymphadenopathy, ( - ) easy bruising Neurological: ( - ) numbness, ( - ) tingling, ( - ) new weaknesses Behavioral/Psych: ( - ) mood change, ( - ) new changes  All other systems were reviewed with the patient and are negative.  PHYSICAL EXAMINATION:  Vitals:   04/05/24 0823  BP: 128/86  Pulse: 65  Resp: 13  Temp: 97.8 F (36.6 C)  SpO2: 100%    Filed Weights   04/05/24 0823  Weight: 196 lb 9.6 oz (89.2 kg)   GENERAL: Well-appearing middle-aged Caucasian male, alert, no distress and comfortable SKIN: skin color, texture, turgor are normal, no rashes or significant lesions EYES: conjunctiva are pink and non-injected, sclera clear LUNGS: clear to auscultation and percussion with normal breathing effort HEART: regular rate & rhythm and no murmurs and no  lower extremity edema Musculoskeletal: no cyanosis of digits and no clubbing  PSYCH: alert & oriented x 3, fluent speech NEURO: no focal motor/sensory deficits  LABORATORY DATA:  I have reviewed the data as listed    Latest Ref Rng & Units 04/05/2024    7:43 AM 10/04/2023    7:50 AM 09/28/2023   10:32 AM  CBC  WBC 4.0 - 10.5 K/uL 7.9  10.7  8.9   Hemoglobin 13.0 - 17.0 g/dL 83.1  84.0  83.9   Hematocrit 39.0 - 52.0 % 49.3  46.9  49.1   Platelets 150 - 400 K/uL 210  219  251        Latest Ref Rng & Units 04/05/2024    7:43 AM 10/04/2023    7:50 AM 09/28/2023   10:32 AM  CMP  Glucose 70 - 99 mg/dL 96  897  76   BUN 8 - 23 mg/dL 11  10  11    Creatinine 0.61 - 1.24 mg/dL 9.06  9.09  9.16   Sodium 135 - 145 mmol/L 139  139  143   Potassium 3.5 - 5.1 mmol/L 5.0  4.1  4.9   Chloride 98 - 111 mmol/L 106  107  104   CO2 22 - 32 mmol/L 29  28  22    Calcium  8.9 - 10.3 mg/dL 9.5  9.0  9.7   Total Protein 6.5 - 8.1 g/dL 6.9  6.6  6.4   Total Bilirubin 0.0 - 1.2 mg/dL 0.5  0.7  0.7   Alkaline Phos 38 - 126 U/L 118  102  126   AST 15 - 41 U/L 23  22  22    ALT 0 - 44 U/L 22  26  23     RADIOGRAPHIC STUDIES: No results found.  ASSESSMENT & PLAN Mario  R Underwood is a 63 y.o. male with medical history significant for an unprovoked pulmonary emboli and right lower extremity DVT who presents for a follow up visit.  After review of the labs, review of the records, and discussion with the patient the patients findings are most consistent with a massive unprovoked pulmonary embolism and right lower extremity DVT.  #Unprovoked DVT/Pulmonary Embolism  --findings at this time are consistent with an unprovoked VTE  --will order at each visit CMP and CBC to assure labs are adequate for DOAC therapy  --ruled out APS with negative anticardiolipin and anti beta2 glycoprotein antibodies.  Lupus anticoagulant panel would be altered by presence of blood thinner, will hold on this testing.   --recommend the  patient continue eliquis  5mg  BID.  Due to the unprovoked nature of this VTE we will need to continue anticoagulation indefinitely. PLAN: --Currently on Eliquis  2.5 mg twice daily for maintenance therapy. Recommend to continue --patient denies any bleeding or dark stools on this medication.  It is well tolerated. No difficulties accessing/affording the medication  --Labs today show white blood cell 7.9, Hgb 16.8, MCV 90.6, Plt 210, creatinine and LFTs normal. --RTC in 6 months' time with strict return precautions for overt signs of bleeding.  No orders of the defined types were placed in this encounter.   All questions were answered. The patient knows to call the clinic with any problems, questions or concerns.  I have spent a total of 25 minutes minutes of face-to-face and non-face-to-face time, preparing to see the patient, performing a medically appropriate examination, counseling and educating the patient, documenting clinical information in the electronic health record, independently interpreting results and communicating results to the patient, and care coordination.   Norleen IVAR Kidney, MD Department of Hematology/Oncology [facility] Phone: [phone] Pager: [phone] Email: [email or web address]  04/05/2024 8:59 AM

## 2024-07-10 ENCOUNTER — Other Ambulatory Visit: Payer: Self-pay | Admitting: Cardiovascular Disease

## 2024-07-10 DIAGNOSIS — E782 Mixed hyperlipidemia: Secondary | ICD-10-CM

## 2024-07-10 DIAGNOSIS — I2510 Atherosclerotic heart disease of native coronary artery without angina pectoris: Secondary | ICD-10-CM

## 2024-07-10 DIAGNOSIS — I10 Essential (primary) hypertension: Secondary | ICD-10-CM

## 2024-09-27 ENCOUNTER — Inpatient Hospital Stay

## 2024-09-27 ENCOUNTER — Inpatient Hospital Stay: Admitting: Hematology and Oncology

## 2024-11-11 ENCOUNTER — Ambulatory Visit: Admitting: Cardiovascular Disease
